# Patient Record
Sex: Male | Born: 1960 | Race: White | Hispanic: No | Marital: Married | Smoking: Never smoker
Health system: Southern US, Community
[De-identification: ages and names within clinical notes are randomized; demographics above are authoritative.]

## PROBLEM LIST (undated history)

## (undated) DIAGNOSIS — J302 Other seasonal allergic rhinitis: Secondary | ICD-10-CM

## (undated) DIAGNOSIS — R413 Other amnesia: Secondary | ICD-10-CM

## (undated) DIAGNOSIS — R519 Headache, unspecified: Secondary | ICD-10-CM

## (undated) DIAGNOSIS — R51 Headache: Secondary | ICD-10-CM

## (undated) DIAGNOSIS — N189 Chronic kidney disease, unspecified: Secondary | ICD-10-CM

## (undated) DIAGNOSIS — G4730 Sleep apnea, unspecified: Secondary | ICD-10-CM

## (undated) DIAGNOSIS — G35 Multiple sclerosis: Secondary | ICD-10-CM

## (undated) DIAGNOSIS — T8859XA Other complications of anesthesia, initial encounter: Secondary | ICD-10-CM

## (undated) DIAGNOSIS — I2510 Atherosclerotic heart disease of native coronary artery without angina pectoris: Secondary | ICD-10-CM

## (undated) DIAGNOSIS — J45909 Unspecified asthma, uncomplicated: Secondary | ICD-10-CM

## (undated) DIAGNOSIS — R569 Unspecified convulsions: Secondary | ICD-10-CM

## (undated) DIAGNOSIS — F419 Anxiety disorder, unspecified: Secondary | ICD-10-CM

## (undated) DIAGNOSIS — C801 Malignant (primary) neoplasm, unspecified: Secondary | ICD-10-CM

## (undated) DIAGNOSIS — S0990XA Unspecified injury of head, initial encounter: Secondary | ICD-10-CM

## (undated) DIAGNOSIS — H539 Unspecified visual disturbance: Secondary | ICD-10-CM

## (undated) DIAGNOSIS — M1990 Unspecified osteoarthritis, unspecified site: Secondary | ICD-10-CM

## (undated) DIAGNOSIS — I639 Cerebral infarction, unspecified: Secondary | ICD-10-CM

## (undated) DIAGNOSIS — F40240 Claustrophobia: Secondary | ICD-10-CM

## (undated) DIAGNOSIS — E162 Hypoglycemia, unspecified: Secondary | ICD-10-CM

## (undated) DIAGNOSIS — R000 Tachycardia, unspecified: Secondary | ICD-10-CM

## (undated) DIAGNOSIS — T4145XA Adverse effect of unspecified anesthetic, initial encounter: Secondary | ICD-10-CM

## (undated) HISTORY — PX: COLONOSCOPY: SHX174

## (undated) HISTORY — DX: Atherosclerotic heart disease of native coronary artery without angina pectoris: I25.10

## (undated) HISTORY — DX: Unspecified convulsions: R56.9

## (undated) HISTORY — DX: Unspecified visual disturbance: H53.9

## (undated) HISTORY — PX: KNEE SURGERY: SHX244

## (undated) HISTORY — PX: NOSE SURGERY: SHX723

## (undated) HISTORY — PX: BACK SURGERY: SHX140

## (undated) HISTORY — PX: WISDOM TOOTH EXTRACTION: SHX21

## (undated) HISTORY — DX: Tachycardia, unspecified: R00.0

## (undated) HISTORY — PX: ESOPHAGOGASTRODUODENOSCOPY: SHX1529

## (undated) HISTORY — DX: Chronic kidney disease, unspecified: N18.9

---

## 2008-07-31 ENCOUNTER — Emergency Department (HOSPITAL_BASED_OUTPATIENT_CLINIC_OR_DEPARTMENT_OTHER): Admission: EM | Admit: 2008-07-31 | Discharge: 2008-07-31 | Payer: Self-pay | Admitting: Emergency Medicine

## 2010-08-29 LAB — DIFFERENTIAL
Basophils Absolute: 0 10*3/uL (ref 0.0–0.1)
Lymphocytes Relative: 22 % (ref 12–46)
Lymphs Abs: 1.3 10*3/uL (ref 0.7–4.0)
Neutro Abs: 4.2 10*3/uL (ref 1.7–7.7)
Neutrophils Relative %: 66 % (ref 43–77)

## 2010-08-29 LAB — GLUCOSE, CAPILLARY: Glucose-Capillary: 101 mg/dL — ABNORMAL HIGH (ref 70–99)

## 2010-08-29 LAB — CBC
HCT: 49.3 % (ref 39.0–52.0)
Platelets: 306 10*3/uL (ref 150–400)
RDW: 12.7 % (ref 11.5–15.5)
WBC: 6.3 10*3/uL (ref 4.0–10.5)

## 2010-08-29 LAB — BASIC METABOLIC PANEL
BUN: 13 mg/dL (ref 6–23)
Calcium: 8.8 mg/dL (ref 8.4–10.5)
Creatinine, Ser: 0.9 mg/dL (ref 0.4–1.5)
GFR calc non Af Amer: >60 mL/min (ref >60)
Potassium: 4.7 mEq/L (ref 3.5–5.1)

## 2010-11-18 ENCOUNTER — Other Ambulatory Visit: Payer: Self-pay | Admitting: Orthopedic Surgery

## 2010-11-18 DIAGNOSIS — G8929 Other chronic pain: Secondary | ICD-10-CM

## 2010-11-18 DIAGNOSIS — M545 Low back pain, unspecified: Secondary | ICD-10-CM

## 2010-11-25 ENCOUNTER — Ambulatory Visit
Admission: RE | Admit: 2010-11-25 | Discharge: 2010-11-25 | Disposition: A | Discharge status: Home / Self Care | Payer: Medicare FFS | Source: Ambulatory Visit | Attending: Orthopedic Surgery | Admitting: Orthopedic Surgery

## 2010-11-25 DIAGNOSIS — M545 Low back pain, unspecified: Secondary | ICD-10-CM

## 2010-11-25 DIAGNOSIS — G8929 Other chronic pain: Secondary | ICD-10-CM

## 2012-10-02 ENCOUNTER — Emergency Department (HOSPITAL_BASED_OUTPATIENT_CLINIC_OR_DEPARTMENT_OTHER): Payer: 59

## 2012-10-02 ENCOUNTER — Encounter (HOSPITAL_BASED_OUTPATIENT_CLINIC_OR_DEPARTMENT_OTHER): Payer: Self-pay | Admitting: *Deleted

## 2012-10-02 ENCOUNTER — Emergency Department (HOSPITAL_BASED_OUTPATIENT_CLINIC_OR_DEPARTMENT_OTHER)
Admission: EM | Admit: 2012-10-02 | Discharge: 2012-10-02 | Disposition: A | Discharge status: Home / Self Care | Payer: 59 | Attending: Emergency Medicine | Admitting: Emergency Medicine

## 2012-10-02 DIAGNOSIS — Y929 Unspecified place or not applicable: Secondary | ICD-10-CM | POA: Insufficient documentation

## 2012-10-02 DIAGNOSIS — S61209A Unspecified open wound of unspecified finger without damage to nail, initial encounter: Secondary | ICD-10-CM | POA: Insufficient documentation

## 2012-10-02 DIAGNOSIS — W260XXA Contact with knife, initial encounter: Secondary | ICD-10-CM | POA: Insufficient documentation

## 2012-10-02 DIAGNOSIS — Y9389 Activity, other specified: Secondary | ICD-10-CM | POA: Insufficient documentation

## 2012-10-02 DIAGNOSIS — J45909 Unspecified asthma, uncomplicated: Secondary | ICD-10-CM | POA: Insufficient documentation

## 2012-10-02 DIAGNOSIS — Z23 Encounter for immunization: Secondary | ICD-10-CM | POA: Insufficient documentation

## 2012-10-02 DIAGNOSIS — G35 Multiple sclerosis: Secondary | ICD-10-CM

## 2012-10-02 DIAGNOSIS — IMO0002 Reserved for concepts with insufficient information to code with codable children: Secondary | ICD-10-CM

## 2012-10-02 DIAGNOSIS — Z8659 Personal history of other mental and behavioral disorders: Secondary | ICD-10-CM | POA: Insufficient documentation

## 2012-10-02 DIAGNOSIS — W261XXA Contact with sword or dagger, initial encounter: Secondary | ICD-10-CM | POA: Insufficient documentation

## 2012-10-02 HISTORY — DX: Unspecified asthma, uncomplicated: J45.909

## 2012-10-02 HISTORY — DX: Multiple sclerosis: G35

## 2012-10-02 HISTORY — DX: Anxiety disorder, unspecified: F41.9

## 2012-10-02 HISTORY — DX: Other seasonal allergic rhinitis: J30.2

## 2012-10-02 MED ORDER — TETANUS-DIPHTH-ACELL PERTUSSIS 5-2.5-18.5 LF-MCG/0.5 IM SUSP
0.5000 mL | Freq: Once | INTRAMUSCULAR | Status: AC
  Administered 2012-10-02: 0.5 mL via INTRAMUSCULAR

## 2012-10-02 MED ORDER — CEPHALEXIN 500 MG PO CAPS: 500.0000 mg | ORAL_CAPSULE | Freq: Four times a day (QID) | ORAL | Status: DC

## 2012-10-02 MED ORDER — PROMETHAZINE HCL 25 MG PO TABS
25.0000 mg | ORAL_TABLET | Freq: Once | ORAL | Status: AC
Start: 2012-10-02 — End: 2012-10-02
  Administered 2012-10-02: 25 mg via ORAL
  Filled 2012-10-02: qty 23

## 2012-10-02 MED ORDER — TETANUS-DIPHTH-ACELL PERTUSSIS 5-2.5-18.5 LF-MCG/0.5 IM SUSP
INTRAMUSCULAR | Status: AC
  Administered 2012-10-02: 0.5 mL via INTRAMUSCULAR
  Filled 2012-10-02: qty 0.5

## 2012-10-02 NOTE — ED Notes (Signed)
Approx 3 cm lac to left thumb. ?ligament involvement. Does not feel touch, but can move thumb. Moist saline dressing applied. Bleeding controlled.

## 2012-10-02 NOTE — ED Notes (Signed)
Pt returns to hall x2 having removed bandage from thumb. Bleeding still controlled, new tape applied, education provided.

## 2012-10-02 NOTE — ED Notes (Signed)
Suture cart is at the bedside set up and ready for the doctor to use. 

## 2012-10-02 NOTE — ED Provider Notes (Signed)
History     CSN: [id number]  Arrival date & time 10/02/12  1505   First MD Initiated Contact with Patient 10/02/12 1552      Chief Complaint  Patient presents with  . Laceration    (Consider location/radiation/quality/duration/timing/severity/associated sxs/prior treatment) HPI Comments: Joel Galloway is a 52 y/o M with PMHx of MS, asthma, and anxiety presenting to the ED after sustaining a laceration to the left thumb. Patient reported that this occurred today, while using his pocket knife - he was trying to open a box and ended up cutting his left thumb. Patient stated that he has a burning sensation to the thumb. Reported mild numbness to the site. Denied radiation of discomfort. Reported mild nausea. Denied headache, dizziness, tingling, chest pain, shortness of breathe, difficulty breathing.    The history is provided by the patient. No language interpreter was used.    Past Medical History  Diagnosis Date  . Multiple sclerosis   . Asthma   . Seasonal allergies   . Anxiety     Past Surgical History  Procedure Laterality Date  . Knee surgery    . Nose surgery    . Wisdom tooth extraction      History reviewed. No pertinent family history.  History  Substance Use Topics  . Smoking status: Never Smoker   . Smokeless tobacco: Not on file  . Alcohol Use: No      Review of Systems  Constitutional: Negative for fever, chills and fatigue.  HENT: Negative for ear pain, congestion, sore throat, trouble swallowing, neck pain and neck stiffness.   Respiratory: Negative for cough, chest tightness, shortness of breath and wheezing.   Cardiovascular: Negative for chest pain.  Gastrointestinal: Negative for nausea, vomiting, abdominal pain, diarrhea and constipation.  Musculoskeletal: Negative for back pain and arthralgias.  Skin: Positive for wound. Negative for rash.  Neurological: Positive for numbness. Negative for dizziness, weakness, light-headedness and headaches.    All other systems reviewed and are negative.    Allergies  Neosporin  Home Medications   Current Outpatient Rx  Name  Route  Sig  Dispense  Refill  . cephALEXin (KEFLEX) 500 MG capsule   Oral   Take 1 capsule (500 mg total) by mouth 4 (four) times daily.   40 capsule   0     BP 132/73  Pulse 94  Temp(Src) 98.2 F (36.8 C) (Oral)  Resp 20  Ht 6\' 1"  (1.854 m)  Wt 170 lb (77.111 kg)  BMI 22.43 kg/m2  SpO2 95%  Physical Exam  Nursing note and vitals reviewed. Constitutional: He is oriented to person, place, and time. He appears well-developed and well-nourished. No distress.  HENT:  Head: Normocephalic and atraumatic.  Mouth/Throat: Oropharynx is clear and moist. No oropharyngeal exudate.  Eyes: Conjunctivae and EOM are normal. Pupils are equal, round, and reactive to light. Right eye exhibits no discharge. Left eye exhibits no discharge.  Neck: Normal range of motion. Neck supple. No tracheal deviation present.  Negative neck stiffness Negative nuchal rigidity Negative lymphadenopathy  Cardiovascular: Normal rate, regular rhythm and normal heart sounds.  Exam reveals no friction rub.   No murmur heard. Radial pulses 2+ bilaterally   Pulmonary/Chest: Effort normal and breath sounds normal. No respiratory distress. He has no wheezes. He has no rales.  Musculoskeletal: Normal range of motion. He exhibits tenderness. He exhibits no edema.       Left hand: He exhibits tenderness and laceration. He exhibits normal range of motion and  no swelling. Normal sensation noted. Normal strength noted.       Hands: Tenderness upon palpation to the left thumb  Lymphadenopathy:    He has no cervical adenopathy.  Neurological: He is alert and oriented to person, place, and time. No cranial nerve deficit. He exhibits normal muscle tone. Coordination normal.  Cranial nerves III-XII grossly intact Sensation present to upper extremities bilaterally with differentiation to sharp and dull  sensation.   Skin: Skin is warm and dry. He is not diaphoretic.  Psychiatric: He has a normal mood and affect. His behavior is normal. Thought content normal.    ED Course  Procedures (including critical care time)  LACERATION REPAIR Performed by: Raymon Mutton Authorized by: Raymon Mutton Consent: Verbal consent obtained. Risks and benefits: risks, benefits and alternatives were discussed Consent given by: patient Patient identity confirmed: provided demographic data Prepped and Draped in normal sterile fashion Wound explored  Laceration Location: left thumb, palmar aspect, tip  Laceration Length: 4cm "V" shaped No Foreign Bodies seen or palpated  Anesthesia: local infiltration  Local anesthetic: lidocaine 2% without epinephrine  Anesthetic total: 5 ml  Irrigation method: syringe Amount of cleaning: standard  Skin closure: approximate  Number of sutures: 5  Technique: single interrupted, 4-0 prolene  Patient tolerance: Patient tolerated the procedure well with no immediate complications.   Labs Reviewed - No data to display Dg Finger Thumb Left  10/02/2012   *RADIOLOGY REPORT*  Clinical Data: Laceration  LEFT THUMB 2+V  Comparison: None.  Findings: Gauze is seen about the thumb.  This slightly decreases imaging detail.  The joints are aligned.  No acute bony fracture is identified.  No joint space abnormality is seen.  There is soft tissue prominence of the thumb.  IMPRESSION: Soft tissue swelling of the thumb with surrounding gauze/bandage. No acute fracture is identified.   Original Report Authenticated By: Britta Mccreedy, M.D.     1. Laceration   2. Multiple sclerosis   3. Asthma       MDM  Patient afebrile, normotensive, non-tachycardic, adequate saturation on room air, alert and oriented.  Left thumb xray - negative fractures, foreign bodies, dislocations noted. No neurovascular damaged noted. Good hemostasis - negative foreign bodies with  exploration. Tendon and deep tendon involvement not noted. Laceration sutured - 5 sutures placed. Tolerated procedure. Tetanus booster administered. Discharged patient. Referred to hand surgeon for Monday. Discussed with patient wound care and return for suture removal. Discharged patient with antibiotics for coverage. Discussed with patient to monitor symptoms and if symptoms are to worsen or change to report back to the ED. Patient agreed to plan of care, understood, all questions answered.           [employer], PA-C 10/03/12 0130

## 2012-10-03 NOTE — ED Provider Notes (Signed)
Medical screening examination/treatment/procedure(s) were performed by non-physician practitioner and as supervising physician I was immediately available for consultation/collaboration.   Charles B. Sheldon, MD 10/03/12 1305 

## 2012-12-27 ENCOUNTER — Encounter (HOSPITAL_COMMUNITY): Payer: Self-pay | Admitting: Pharmacy Technician

## 2012-12-29 ENCOUNTER — Other Ambulatory Visit: Payer: Self-pay | Admitting: Specialist

## 2012-12-31 NOTE — H&P (Signed)
Joel Galloway is an 52 y.o. male.   Chief Complaint: low back pain with radiation into the right leg greater than the left. HPI:  He relates that he has had a recent fall and has been having flare-ups in regards to his back and radiation of pain into both of his lower extremities, right side greater than left.  He has weakness in his left leg that has been present now for years since he underwent previous left knee surgery.  He relates that his back pain dates back years ago to 1987 when he reports that he was building a deck on the back of his house.  When his wife stepped out onto this he caught the deck to prevent her from falling and had discomfort and pain.  He has had problems with his back ever since.  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  He relates that he has had several injuries with broken bones in his arms and legs relative to his activities.  Most recently his back pain has reached the point where he is having difficulty sleeping at night.  He sleeps for only a couple of hours before he has to move around and get up.  He is finding it increasingly more difficult to walk any distance, even to the car now.  He has difficulty walking in the stores.  He has to use a cart to assist in being upright.  He relates that he remains quite flexible, though, although he does have trouble with prolonged standing and ambulation.  He cannot walk a mile.  He cannot walk a quarter of a mile.  He feels as though his quality of life is dropping off tremendously.   MRI study was done at [facility] on 12/13/2012.  The study  indicates a far lateral left sided L3-4 protrusion that extends into the neural foramen without significant stenosis.  Some joint hypertrophy at L2-3 with no nerve compression.  At 4-5 a bulging disk with joint hypertrophy but no stenosis.  At L5-S1 a large central, right paracentral disk protrusion that is significant,  now with severe right lateral recess narrowing and mild to moderate left lateral recess narrowing.    Past Medical History  Diagnosis Date  . Multiple sclerosis   . Asthma   . Seasonal allergies   . Anxiety     Past Surgical History  Procedure Laterality Date  . Knee surgery    . Nose surgery    . Wisdom tooth extraction      No family history on file. Social History:  reports that he has never smoked. He does not have any smokeless tobacco history on file. He reports that he does not drink alcohol or use illicit drugs.  Allergies:  Allergies  Allergen Reactions  . Lyrica [Pregabalin] Other (See Comments)  . Gabapentin Other (See Comments)  . Neosporin [Neomycin-Bacitracin Zn-Polymyx] Swelling    No prescriptions prior to admission    No results found for this or any previous visit (from the past 48 hour(s)). No results found.  Review of Systems  Constitutional: Negative.   HENT: Negative.   Eyes: Negative.   Respiratory: Negative.   Cardiovascular: Negative.   Gastrointestinal: Positive for constipation.       Chronic constipation requiring regular laxative.  Genitourinary:       Uses Flomax for "difficulty urinating".  Occasional incontinence.  Musculoskeletal: Positive for back  pain and joint pain.       Previous surgery for both knees.  Severe back pain limiting activity.  Skin: Negative.   Neurological: Positive for focal weakness.       Diagnosis of Multiple Sclerosis.    Psychiatric/Behavioral: The patient is nervous/anxious.        Chronic pain syndrome requiring large quantities of narcotic analgesics.    There were no vitals taken for this visit. Physical Exam  Constitutional: He is oriented to person, place, and time. He appears well-developed and well-nourished.  HENT:  Head: Normocephalic and atraumatic.  Eyes: EOM are normal. Pupils are equal, round, and reactive to light.  Neck: Normal range of motion. Neck supple.  Cardiovascular: Normal  rate, regular rhythm, normal heart sounds and intact distal pulses.   Respiratory: Effort normal and breath sounds normal.  GI: Soft.  Musculoskeletal:  weak in left foot dorsiflexion.  This is 2/4.  Plantar flexion strength is also decreased on the left at 2/4.  Left knee extension and flexion is normal.  Hip abduction and adduction testing is normal.  The right lower extremity plantar flexion is normal.  Dorsiflexion of the right foot is normal.  He has pain with attempts at leg raise on the right with the hip brought across the midline.  Reflexes at the knee are 2+ and symmetric.  At the ankle are 0 and symmetric.  Popliteal compression sign is negative on both sides.  He has a tendency to sit leaning to the left side, complaining of pain into his right buttock.  EHL is weak on the right at 5-/5  Neurological: He is alert and oriented to person, place, and time.  Skin: Skin is warm and dry.  Psychiatric: He has a normal mood and affect.  Pt very anxious.     Assessment/Plan Degenerative disc disease and HNP L5-S1  PLAN:  Decompression L5-S1,bilateral microdiscectomy.  Right L5-S1 TLIF with cage and local bone graft.  Pedicle screw and rod instrumentation. Patient was seen and examined in the preop holding area. There has been no interval  Change in this patient's exam preop  history and physical exam  Lab tests and images have been examined and reviewed.  The Risks benefits and alternative treatments have been discussed  extensively,questions answered.  The patient has elected to undergo the discussed surgical treatment. VERNON,SHEILA M 12/31/2012, 3:05 PM

## 2013-01-01 NOTE — Pre-Procedure Instructions (Addendum)
IRINEO GAULIN  01/01/2013   Your procedure is scheduled on:  August 25  Report to [facility] at 05:30 AM.  Call this number if you have problems the morning of surgery: [phone]   Remember:   Do not eat food or drink liquids after midnight.   Take these medicines the morning of surgery with A SIP OF WATER: Valium (if needed), Hydrocodone (if needed), Morphine (if needed), Omeprazole , Opana (if needed), Flomax,   STOP Ibuprofen today  Do not take Aspirin, Aleve, Naproxen, Advil, Ibuprofen, Vitamin, Herbs, or Supplements starting today  Do not wear jewelry, make-up or nail polish.  Do not wear lotions, powders, or perfumes. You may wear deodorant.  Do not shave 48 hours prior to surgery. Men may shave face and neck.  Do not bring valuables to the hospital.  [facility] is not responsible                   for any belongings or valuables.  Contacts, dentures or bridgework may not be worn into surgery.  Leave suitcase in the car. After surgery it may be brought to your room.  For patients admitted to the hospital, checkout time is 11:00 AM the day of  discharge.   Special Instructions: Shower using CHG 2 nights before surgery and the night before surgery.  If you shower the day of surgery use CHG.  Use special wash - you have one bottle of CHG for all showers.  You should use approximately 1/3 of the bottle for each shower.   Please read over the following fact sheets that you were given: Pain Booklet, Coughing and Deep Breathing, Blood Transfusion Information, MRSA Information and Surgical Site Infection Prevention

## 2013-01-03 ENCOUNTER — Encounter (HOSPITAL_COMMUNITY)
Admission: RE | Admit: 2013-01-03 | Discharge: 2013-01-03 | Disposition: A | Discharge status: Home / Self Care | Payer: 59 | Source: Ambulatory Visit | Attending: Anesthesiology | Admitting: Anesthesiology

## 2013-01-03 ENCOUNTER — Encounter (HOSPITAL_COMMUNITY)
Admission: RE | Admit: 2013-01-03 | Discharge: 2013-01-03 | Disposition: A | Discharge status: Home / Self Care | Payer: 59 | Source: Ambulatory Visit | Attending: Specialist | Admitting: Specialist

## 2013-01-03 ENCOUNTER — Encounter (HOSPITAL_COMMUNITY): Payer: Self-pay

## 2013-01-03 DIAGNOSIS — R9431 Abnormal electrocardiogram [ECG] [EKG]: Secondary | ICD-10-CM | POA: Insufficient documentation

## 2013-01-03 DIAGNOSIS — Z01818 Encounter for other preprocedural examination: Secondary | ICD-10-CM | POA: Insufficient documentation

## 2013-01-03 DIAGNOSIS — Z01812 Encounter for preprocedural laboratory examination: Secondary | ICD-10-CM | POA: Insufficient documentation

## 2013-01-03 DIAGNOSIS — Z01810 Encounter for preprocedural cardiovascular examination: Secondary | ICD-10-CM | POA: Insufficient documentation

## 2013-01-03 HISTORY — DX: Hypoglycemia, unspecified: E16.2

## 2013-01-03 HISTORY — DX: Malignant (primary) neoplasm, unspecified: C80.1

## 2013-01-03 HISTORY — DX: Unspecified osteoarthritis, unspecified site: M19.90

## 2013-01-03 LAB — TYPE AND SCREEN
ABO/RH(D): A NEG
Antibody Screen: NEGATIVE

## 2013-01-03 LAB — COMPREHENSIVE METABOLIC PANEL
Alkaline Phosphatase: 30 U/L — ABNORMAL LOW (ref 39–117)
BUN: 11 mg/dL (ref 6–23)
CO2: 26 mEq/L (ref 19–32)
Chloride: 99 mEq/L (ref 96–112)
Creatinine, Ser: 0.57 mg/dL (ref 0.50–1.35)
GFR calc Af Amer: >90 mL/min (ref >90)
GFR calc non Af Amer: >90 mL/min (ref >90)
Glucose, Bld: 120 mg/dL — ABNORMAL HIGH (ref 70–99)
Potassium: 4.7 mEq/L (ref 3.5–5.1)
Total Bilirubin: 0.2 mg/dL — ABNORMAL LOW (ref 0.3–1.2)

## 2013-01-03 LAB — URINALYSIS, ROUTINE W REFLEX MICROSCOPIC
Bilirubin Urine: NEGATIVE
Glucose, UA: NEGATIVE mg/dL
Hgb urine dipstick: NEGATIVE
Protein, ur: NEGATIVE mg/dL
Urobilinogen, UA: 0.2 mg/dL (ref 0.0–1.0)

## 2013-01-03 LAB — CBC
HCT: 42.9 % (ref 39.0–52.0)
Hemoglobin: 15.8 g/dL (ref 13.0–17.0)
MCV: 87.6 fL (ref 78.0–100.0)
RDW: 12.5 % (ref 11.5–15.5)
WBC: 5.8 10*3/uL (ref 4.0–10.5)

## 2013-01-03 LAB — SURGICAL PCR SCREEN: MRSA, PCR: NEGATIVE

## 2013-01-04 MED ORDER — BUPIVACAINE HCL (PF) 0.5 % IJ SOLN
INTRAMUSCULAR | Status: AC
  Filled 2013-01-04: qty 30

## 2013-01-09 MED ORDER — CHLORHEXIDINE GLUCONATE 4 % EX LIQD: 60.0000 mL | Freq: Once | CUTANEOUS | Status: DC

## 2013-01-09 MED ORDER — CEFAZOLIN SODIUM-DEXTROSE 2-3 GM-% IV SOLR
2.0000 g | INTRAVENOUS | Status: AC
  Administered 2013-01-10: 2 g via INTRAVENOUS
  Filled 2013-01-09: qty 50

## 2013-01-10 ENCOUNTER — Encounter (HOSPITAL_COMMUNITY): Payer: Self-pay | Admitting: *Deleted

## 2013-01-10 ENCOUNTER — Inpatient Hospital Stay (HOSPITAL_COMMUNITY)
Admission: RE | Admit: 2013-01-10 | Discharge: 2013-01-13 | DRG: 460 | Disposition: A | Discharge status: Home Health | Payer: 59 | Source: Ambulatory Visit | Attending: Specialist | Admitting: Specialist

## 2013-01-10 ENCOUNTER — Encounter (HOSPITAL_COMMUNITY): Payer: Self-pay | Admitting: Certified Registered"

## 2013-01-10 ENCOUNTER — Inpatient Hospital Stay (HOSPITAL_COMMUNITY): Payer: 59 | Admitting: Certified Registered"

## 2013-01-10 ENCOUNTER — Encounter (HOSPITAL_COMMUNITY)
Admission: RE | Disposition: A | Discharge status: Home Health | Payer: Self-pay | Source: Ambulatory Visit | Attending: Specialist

## 2013-01-10 ENCOUNTER — Inpatient Hospital Stay (HOSPITAL_COMMUNITY): Payer: 59

## 2013-01-10 DIAGNOSIS — M5126 Other intervertebral disc displacement, lumbar region: Secondary | ICD-10-CM | POA: Diagnosis present

## 2013-01-10 DIAGNOSIS — J45909 Unspecified asthma, uncomplicated: Secondary | ICD-10-CM | POA: Diagnosis present

## 2013-01-10 DIAGNOSIS — IMO0002 Reserved for concepts with insufficient information to code with codable children: Secondary | ICD-10-CM | POA: Diagnosis not present

## 2013-01-10 DIAGNOSIS — M51379 Other intervertebral disc degeneration, lumbosacral region without mention of lumbar back pain or lower extremity pain: Secondary | ICD-10-CM | POA: Diagnosis present

## 2013-01-10 DIAGNOSIS — F411 Generalized anxiety disorder: Secondary | ICD-10-CM | POA: Diagnosis present

## 2013-01-10 DIAGNOSIS — Y9210 Unspecified residential institution as the place of occurrence of the external cause: Secondary | ICD-10-CM | POA: Diagnosis not present

## 2013-01-10 DIAGNOSIS — G894 Chronic pain syndrome: Secondary | ICD-10-CM | POA: Diagnosis present

## 2013-01-10 DIAGNOSIS — M5137 Other intervertebral disc degeneration, lumbosacral region: Secondary | ICD-10-CM

## 2013-01-10 DIAGNOSIS — Z888 Allergy status to other drugs, medicaments and biological substances status: Secondary | ICD-10-CM

## 2013-01-10 DIAGNOSIS — G35 Multiple sclerosis: Secondary | ICD-10-CM | POA: Diagnosis present

## 2013-01-10 DIAGNOSIS — M5136 Other intervertebral disc degeneration, lumbar region: Secondary | ICD-10-CM | POA: Diagnosis present

## 2013-01-10 DIAGNOSIS — G9741 Accidental puncture or laceration of dura during a procedure: Secondary | ICD-10-CM | POA: Diagnosis not present

## 2013-01-10 DIAGNOSIS — M48062 Spinal stenosis, lumbar region with neurogenic claudication: Secondary | ICD-10-CM | POA: Diagnosis present

## 2013-01-10 DIAGNOSIS — K5900 Constipation, unspecified: Secondary | ICD-10-CM | POA: Diagnosis present

## 2013-01-10 HISTORY — PX: POSTERIOR FUSION LUMBAR SPINE: SUR632

## 2013-01-10 LAB — GLUCOSE, CAPILLARY: Glucose-Capillary: 94 mg/dL (ref 70–99)

## 2013-01-10 SURGERY — POSTERIOR LUMBAR FUSION 1 LEVEL
Anesthesia: General | Site: Back | Wound class: Clean

## 2013-01-10 MED ORDER — ONDANSETRON HCL 4 MG/2ML IJ SOLN
4.0000 mg | INTRAMUSCULAR | Status: DC | PRN
  Administered 2013-01-11 – 2013-01-12 (×4): 4 mg via INTRAVENOUS
  Filled 2013-01-10 (×2): qty 2

## 2013-01-10 MED ORDER — NEOSTIGMINE METHYLSULFATE 1 MG/ML IJ SOLN
INTRAMUSCULAR | Status: DC | PRN
  Administered 2013-01-10: 3 mg via INTRAVENOUS

## 2013-01-10 MED ORDER — KETOROLAC TROMETHAMINE 30 MG/ML IJ SOLN
30.0000 mg | Freq: Four times a day (QID) | INTRAMUSCULAR | Status: AC
  Administered 2013-01-11 – 2013-01-12 (×5): 30 mg via INTRAVENOUS
  Filled 2013-01-10 (×5): qty 1

## 2013-01-10 MED ORDER — PHENOL 1.4 % MT LIQD: 1.0000 | OROMUCOSAL | Status: DC | PRN

## 2013-01-10 MED ORDER — MORPHINE SULFATE 2 MG/ML IJ SOLN
INTRAMUSCULAR | Status: AC
  Administered 2013-01-10: 2 mg via INTRAVENOUS
  Filled 2013-01-10: qty 1

## 2013-01-10 MED ORDER — DOCUSATE SODIUM 100 MG PO CAPS
100.0000 mg | ORAL_CAPSULE | Freq: Two times a day (BID) | ORAL | Status: DC
  Administered 2013-01-10 – 2013-01-12 (×4): 100 mg via ORAL
  Filled 2013-01-10 (×7): qty 1

## 2013-01-10 MED ORDER — MIDAZOLAM HCL 5 MG/5ML IJ SOLN
INTRAMUSCULAR | Status: DC | PRN
  Administered 2013-01-10: 2 mg via INTRAVENOUS

## 2013-01-10 MED ORDER — SODIUM CHLORIDE 0.9 % IJ SOLN: 3.0000 mL | INTRAMUSCULAR | Status: DC | PRN

## 2013-01-10 MED ORDER — LINACLOTIDE 290 MCG PO CAPS: 290.0000 ug | ORAL_CAPSULE | Freq: Every day | ORAL | Status: DC

## 2013-01-10 MED ORDER — HYDROMORPHONE HCL PF 1 MG/ML IJ SOLN
0.2500 mg | INTRAMUSCULAR | Status: DC | PRN
  Administered 2013-01-10 (×3): 0.5 mg via INTRAVENOUS

## 2013-01-10 MED ORDER — BUPIVACAINE-EPINEPHRINE 0.5% -1:200000 IJ SOLN
INTRAMUSCULAR | Status: DC | PRN
  Administered 2013-01-10: 9 mL

## 2013-01-10 MED ORDER — ALUM & MAG HYDROXIDE-SIMETH 200-200-20 MG/5ML PO SUSP: 30.0000 mL | Freq: Four times a day (QID) | ORAL | Status: DC | PRN

## 2013-01-10 MED ORDER — CEFAZOLIN SODIUM 1-5 GM-% IV SOLN
1.0000 g | Freq: Three times a day (TID) | INTRAVENOUS | Status: AC
  Administered 2013-01-10 (×2): 1 g via INTRAVENOUS
  Filled 2013-01-10 (×2): qty 50

## 2013-01-10 MED ORDER — LISINOPRIL 20 MG PO TABS
20.0000 mg | ORAL_TABLET | Freq: Every day | ORAL | Status: DC
  Filled 2013-01-10: qty 1

## 2013-01-10 MED ORDER — DIPHENHYDRAMINE HCL 50 MG/ML IJ SOLN
12.5000 mg | Freq: Four times a day (QID) | INTRAMUSCULAR | Status: DC | PRN
  Administered 2013-01-12: 12.5 mg via INTRAVENOUS
  Filled 2013-01-10: qty 1

## 2013-01-10 MED ORDER — ACETAMINOPHEN 325 MG PO TABS
650.0000 mg | ORAL_TABLET | ORAL | Status: DC | PRN
Start: 2013-01-10 — End: 2013-01-13
  Administered 2013-01-10 – 2013-01-12 (×5): 650 mg via ORAL
  Filled 2013-01-10 (×2): qty 2
  Filled 2013-01-10: qty 1
  Filled 2013-01-10 (×2): qty 2

## 2013-01-10 MED ORDER — FLEET ENEMA 7-19 GM/118ML RE ENEM: 1.0000 | ENEMA | Freq: Once | RECTAL | Status: AC | PRN

## 2013-01-10 MED ORDER — DIPHENHYDRAMINE HCL 12.5 MG/5ML PO ELIX: 12.5000 mg | ORAL_SOLUTION | Freq: Four times a day (QID) | ORAL | Status: DC | PRN

## 2013-01-10 MED ORDER — BUPIVACAINE-EPINEPHRINE (PF) 0.5% -1:200000 IJ SOLN
INTRAMUSCULAR | Status: AC
  Filled 2013-01-10: qty 10

## 2013-01-10 MED ORDER — ARTIFICIAL TEARS OP OINT
TOPICAL_OINTMENT | OPHTHALMIC | Status: DC | PRN
  Administered 2013-01-10: 1 via OPHTHALMIC

## 2013-01-10 MED ORDER — THROMBIN 20000 UNITS EX SOLR
CUTANEOUS | Status: AC
  Filled 2013-01-10: qty 20000

## 2013-01-10 MED ORDER — NALOXONE HCL 0.4 MG/ML IJ SOLN: 0.4000 mg | INTRAMUSCULAR | Status: DC | PRN

## 2013-01-10 MED ORDER — THROMBIN 20000 UNITS EX SOLR
CUTANEOUS | Status: DC | PRN
  Administered 2013-01-10: 08:00:00 via TOPICAL

## 2013-01-10 MED ORDER — FENTANYL CITRATE 0.05 MG/ML IJ SOLN
INTRAMUSCULAR | Status: DC | PRN
  Administered 2013-01-10: 50 ug via INTRAVENOUS
  Administered 2013-01-10: 100 ug via INTRAVENOUS
  Administered 2013-01-10: 200 ug via INTRAVENOUS

## 2013-01-10 MED ORDER — SODIUM CHLORIDE 0.9 % IJ SOLN: 9.0000 mL | INTRAMUSCULAR | Status: DC | PRN

## 2013-01-10 MED ORDER — KCL IN DEXTROSE-NACL 20-5-0.9 MEQ/L-%-% IV SOLN
INTRAVENOUS | Status: DC
  Administered 2013-01-11 – 2013-01-12 (×3): via INTRAVENOUS
  Filled 2013-01-10 (×8): qty 1000

## 2013-01-10 MED ORDER — POLYETHYLENE GLYCOL 3350 17 G PO PACK
17.0000 g | PACK | Freq: Every day | ORAL | Status: DC | PRN
  Administered 2013-01-12: 17 g via ORAL
  Filled 2013-01-10: qty 1

## 2013-01-10 MED ORDER — MORPHINE SULFATE (PF) 1 MG/ML IV SOLN
INTRAVENOUS | Status: DC
  Administered 2013-01-10: 25 mL via INTRAVENOUS
  Administered 2013-01-10: 23:00:00 via INTRAVENOUS
  Administered 2013-01-11: 8.46 mg via INTRAVENOUS
  Administered 2013-01-11 (×3): via INTRAVENOUS
  Administered 2013-01-11: 23.6 mg via INTRAVENOUS
  Administered 2013-01-11: 16.9 mg via INTRAVENOUS
  Administered 2013-01-11 (×2): via INTRAVENOUS
  Administered 2013-01-12: 42.72 mg via INTRAVENOUS
  Administered 2013-01-12: 02:00:00 via INTRAVENOUS
  Administered 2013-01-12: 18 mg via INTRAVENOUS
  Administered 2013-01-12 (×4): via INTRAVENOUS
  Administered 2013-01-12: 10.5 mg via INTRAVENOUS
  Administered 2013-01-12: 23.81 mg via INTRAVENOUS
  Administered 2013-01-12: 18:00:00 via INTRAVENOUS
  Administered 2013-01-12: 27.94 mg via INTRAVENOUS
  Filled 2013-01-10 (×13): qty 25

## 2013-01-10 MED ORDER — ROCURONIUM BROMIDE 100 MG/10ML IV SOLN
INTRAVENOUS | Status: DC | PRN
  Administered 2013-01-10: 50 mg via INTRAVENOUS
  Administered 2013-01-10 (×4): 10 mg via INTRAVENOUS
  Administered 2013-01-10: 20 mg via INTRAVENOUS

## 2013-01-10 MED ORDER — LACTATED RINGERS IV SOLN
INTRAVENOUS | Status: DC | PRN
  Administered 2013-01-10 (×3): via INTRAVENOUS

## 2013-01-10 MED ORDER — ACETAMINOPHEN 650 MG RE SUPP
650.0000 mg | RECTAL | Status: DC | PRN
  Administered 2013-01-12: 650 mg via RECTAL
  Filled 2013-01-10: qty 1

## 2013-01-10 MED ORDER — PHENYLEPHRINE HCL 10 MG/ML IJ SOLN
INTRAMUSCULAR | Status: DC | PRN
  Administered 2013-01-10 (×2): 80 ug via INTRAVENOUS
  Administered 2013-01-10: 160 ug via INTRAVENOUS
  Administered 2013-01-10 (×5): 80 ug via INTRAVENOUS

## 2013-01-10 MED ORDER — ONDANSETRON HCL 4 MG/2ML IJ SOLN
INTRAMUSCULAR | Status: DC | PRN
  Administered 2013-01-10: 4 mg via INTRAVENOUS

## 2013-01-10 MED ORDER — OXYCODONE HCL 5 MG PO TABS
ORAL_TABLET | ORAL | Status: AC
  Administered 2013-01-10: 14:00:00
  Filled 2013-01-10: qty 1

## 2013-01-10 MED ORDER — OXYCODONE HCL 5 MG PO TABS
5.0000 mg | ORAL_TABLET | Freq: Once | ORAL | Status: AC | PRN
  Administered 2013-01-10: 5 mg via ORAL

## 2013-01-10 MED ORDER — HYDROCODONE-ACETAMINOPHEN 10-325 MG PO TABS
1.0000 | ORAL_TABLET | ORAL | Status: DC | PRN
  Administered 2013-01-10 – 2013-01-13 (×10): 1 via ORAL
  Filled 2013-01-10 (×11): qty 1

## 2013-01-10 MED ORDER — ONDANSETRON HCL 4 MG/2ML IJ SOLN: 4.0000 mg | Freq: Once | INTRAMUSCULAR | Status: DC | PRN

## 2013-01-10 MED ORDER — OXYCODONE HCL 5 MG/5ML PO SOLN: 5.0000 mg | Freq: Once | ORAL | Status: AC | PRN

## 2013-01-10 MED ORDER — ONDANSETRON HCL 4 MG/2ML IJ SOLN
4.0000 mg | Freq: Four times a day (QID) | INTRAMUSCULAR | Status: DC | PRN
  Filled 2013-01-10 (×2): qty 2

## 2013-01-10 MED ORDER — PANTOPRAZOLE SODIUM 40 MG PO TBEC
40.0000 mg | DELAYED_RELEASE_TABLET | Freq: Every day | ORAL | Status: DC
  Administered 2013-01-10 – 2013-01-13 (×4): 40 mg via ORAL
  Filled 2013-01-10 (×3): qty 1

## 2013-01-10 MED ORDER — MORPHINE SULFATE ER 15 MG PO TBCR
60.0000 mg | EXTENDED_RELEASE_TABLET | Freq: Three times a day (TID) | ORAL | Status: DC | PRN
  Administered 2013-01-10: 30 mg via ORAL
  Administered 2013-01-11 – 2013-01-13 (×6): 60 mg via ORAL
  Filled 2013-01-10 (×4): qty 4
  Filled 2013-01-10: qty 1
  Filled 2013-01-10 (×3): qty 4

## 2013-01-10 MED ORDER — LINACLOTIDE 290 MCG PO CAPS
290.0000 mg | ORAL_CAPSULE | Freq: Every morning | ORAL | Status: DC
  Administered 2013-01-11 – 2013-01-12 (×2): 290 mg via ORAL
  Filled 2013-01-10 (×3): qty 1

## 2013-01-10 MED ORDER — EVICEL 2 ML EX KIT
PACK | CUTANEOUS | Status: DC | PRN
  Administered 2013-01-10: 2 mL

## 2013-01-10 MED ORDER — BISACODYL 10 MG RE SUPP: 10.0000 mg | Freq: Every day | RECTAL | Status: DC | PRN

## 2013-01-10 MED ORDER — DIPHENHYDRAMINE HCL 25 MG PO CAPS: 25.0000 mg | ORAL_CAPSULE | Freq: Four times a day (QID) | ORAL | Status: DC | PRN

## 2013-01-10 MED ORDER — SODIUM CHLORIDE 0.9 % IV SOLN
INTRAVENOUS | Status: DC | PRN
  Administered 2013-01-10: 11:00:00 via INTRAVENOUS

## 2013-01-10 MED ORDER — MEPERIDINE HCL 25 MG/ML IJ SOLN: 6.2500 mg | INTRAMUSCULAR | Status: DC | PRN

## 2013-01-10 MED ORDER — HYDROMORPHONE HCL PF 1 MG/ML IJ SOLN
INTRAMUSCULAR | Status: AC
  Administered 2013-01-10: 13:00:00
  Filled 2013-01-10: qty 1

## 2013-01-10 MED ORDER — ALBUTEROL SULFATE HFA 108 (90 BASE) MCG/ACT IN AERS
2.0000 | INHALATION_SPRAY | Freq: Four times a day (QID) | RESPIRATORY_TRACT | Status: DC | PRN
  Filled 2013-01-10: qty 6.7

## 2013-01-10 MED ORDER — HYDROMORPHONE HCL PF 1 MG/ML IJ SOLN
INTRAMUSCULAR | Status: AC
  Administered 2013-01-10: 14:00:00
  Filled 2013-01-10: qty 1

## 2013-01-10 MED ORDER — 0.9 % SODIUM CHLORIDE (POUR BTL) OPTIME
TOPICAL | Status: DC | PRN
  Administered 2013-01-10: 1000 mL

## 2013-01-10 MED ORDER — MORPHINE SULFATE 15 MG PO TABS
30.0000 mg | ORAL_TABLET | ORAL | Status: DC | PRN
  Administered 2013-01-10: 30 mg via ORAL
  Filled 2013-01-10: qty 2

## 2013-01-10 MED ORDER — TAMSULOSIN HCL 0.4 MG PO CAPS
0.4000 mg | ORAL_CAPSULE | Freq: Every day | ORAL | Status: DC
  Administered 2013-01-11 – 2013-01-13 (×3): 0.4 mg via ORAL
  Filled 2013-01-10 (×4): qty 1

## 2013-01-10 MED ORDER — MORPHINE SULFATE 2 MG/ML IJ SOLN
1.0000 mg | INTRAMUSCULAR | Status: DC | PRN
  Administered 2013-01-10: 2 mg via INTRAVENOUS
  Filled 2013-01-10: qty 1
  Filled 2013-01-10: qty 2

## 2013-01-10 MED ORDER — MENTHOL 3 MG MT LOZG: 1.0000 | LOZENGE | OROMUCOSAL | Status: DC | PRN

## 2013-01-10 MED ORDER — SODIUM CHLORIDE 0.9 % IJ SOLN
3.0000 mL | Freq: Two times a day (BID) | INTRAMUSCULAR | Status: DC
  Administered 2013-01-10: 3 mL via INTRAVENOUS

## 2013-01-10 MED ORDER — KETOROLAC TROMETHAMINE 30 MG/ML IJ SOLN
30.0000 mg | Freq: Four times a day (QID) | INTRAMUSCULAR | Status: DC
  Administered 2013-01-10: 30 mg via INTRAVENOUS
  Filled 2013-01-10: qty 1

## 2013-01-10 MED ORDER — GLYCOPYRROLATE 0.2 MG/ML IJ SOLN
INTRAMUSCULAR | Status: DC | PRN
  Administered 2013-01-10: 0.4 mg via INTRAVENOUS

## 2013-01-10 MED ORDER — EVICEL 2 ML EX KIT
PACK | CUTANEOUS | Status: AC
  Filled 2013-01-10: qty 1

## 2013-01-10 MED ORDER — INTERFERON BETA-1A 30 MCG/0.5ML IM KIT: 30.0000 ug | PACK | INTRAMUSCULAR | Status: DC

## 2013-01-10 MED ORDER — PROPOFOL 10 MG/ML IV BOLUS
INTRAVENOUS | Status: DC | PRN
  Administered 2013-01-10: 200 mg via INTRAVENOUS

## 2013-01-10 MED ORDER — DIAZEPAM 5 MG PO TABS
10.0000 mg | ORAL_TABLET | Freq: Four times a day (QID) | ORAL | Status: DC | PRN
Start: 2013-01-10 — End: 2013-01-13
  Administered 2013-01-10: 10 mg via ORAL
  Administered 2013-01-10: 5 mg via ORAL
  Administered 2013-01-11 – 2013-01-12 (×5): 10 mg via ORAL
  Filled 2013-01-10: qty 2
  Filled 2013-01-10: qty 1
  Filled 2013-01-10 (×2): qty 2
  Filled 2013-01-10: qty 1
  Filled 2013-01-10: qty 2
  Filled 2013-01-10: qty 1
  Filled 2013-01-10: qty 2

## 2013-01-10 MED ORDER — DIAZEPAM 5 MG PO TABS
ORAL_TABLET | ORAL | Status: AC
  Administered 2013-01-10: 5 mg
  Filled 2013-01-10: qty 1

## 2013-01-10 MED ORDER — SODIUM CHLORIDE 0.9 % IV SOLN: 250.0000 mL | INTRAVENOUS | Status: DC

## 2013-01-10 SURGICAL SUPPLY — 72 items
BLADE SURG ROTATE 9660 (MISCELLANEOUS) IMPLANT
BONE FILLER CHRONOS 100X25X6 (Bone Implant) ×2 IMPLANT
BUR ROUND FLUTED 4 SOFT TCH (BURR) ×2 IMPLANT
CAGE CONCORDE BULLET 9X8X27 (Cage) ×1 IMPLANT
CAGE SPNL 5D BLT NOSE 27X9X8X (Cage) ×1 IMPLANT
CLOTH BEACON ORANGE TIMEOUT ST (SAFETY) ×2 IMPLANT
CORDS BIPOLAR (ELECTRODE) ×2 IMPLANT
COVER MAYO STAND STRL (DRAPES) ×4 IMPLANT
COVER SURGICAL LIGHT HANDLE (MISCELLANEOUS) ×2 IMPLANT
DERMABOND ADHESIVE PROPEN (GAUZE/BANDAGES/DRESSINGS) ×1
DERMABOND ADVANCED (GAUZE/BANDAGES/DRESSINGS) ×1
DERMABOND ADVANCED .7 DNX12 (GAUZE/BANDAGES/DRESSINGS) ×1 IMPLANT
DERMABOND ADVANCED .7 DNX6 (GAUZE/BANDAGES/DRESSINGS) ×1 IMPLANT
DRAPE C-ARM 42X72 X-RAY (DRAPES) ×2 IMPLANT
DRAPE SURG 17X23 STRL (DRAPES) ×6 IMPLANT
DRAPE TABLE COVER HEAVY DUTY (DRAPES) ×2 IMPLANT
DRSG MEPILEX BORDER 4X4 (GAUZE/BANDAGES/DRESSINGS) IMPLANT
DRSG MEPILEX BORDER 4X8 (GAUZE/BANDAGES/DRESSINGS) ×2 IMPLANT
DURAPREP 26ML APPLICATOR (WOUND CARE) ×2 IMPLANT
ELECT BLADE 6.5 EXT (BLADE) ×2 IMPLANT
ELECT CAUTERY BLADE 6.4 (BLADE) ×2 IMPLANT
ELECT REM PT RETURN 9FT ADLT (ELECTROSURGICAL) ×2
ELECTRODE REM PT RTRN 9FT ADLT (ELECTROSURGICAL) ×1 IMPLANT
EVACUATOR 1/8 PVC DRAIN (DRAIN) IMPLANT
GLOVE BIOGEL PI IND STRL 7.5 (GLOVE) ×1 IMPLANT
GLOVE BIOGEL PI IND STRL 8 (GLOVE) ×1 IMPLANT
GLOVE BIOGEL PI INDICATOR 7.5 (GLOVE) ×1
GLOVE BIOGEL PI INDICATOR 8 (GLOVE) ×1
GLOVE BIOGEL PI ORTHO PRO 7.5 (GLOVE) ×1
GLOVE ECLIPSE 7.0 STRL STRAW (GLOVE) ×2 IMPLANT
GLOVE ECLIPSE 8.5 STRL (GLOVE) ×4 IMPLANT
GLOVE PI ORTHO PRO STRL 7.5 (GLOVE) ×1 IMPLANT
GLOVE SURG 8.5 LATEX PF (GLOVE) ×2 IMPLANT
GLOVE SURG SS PI 7.5 STRL IVOR (GLOVE) ×4 IMPLANT
GOWN PREVENTION PLUS LG XLONG (DISPOSABLE) IMPLANT
GOWN PREVENTION PLUS XXLARGE (GOWN DISPOSABLE) ×4 IMPLANT
GOWN STRL NON-REIN LRG LVL3 (GOWN DISPOSABLE) ×4 IMPLANT
GOWN STRL REIN XL XLG (GOWN DISPOSABLE) ×2 IMPLANT
HEMOSTAT SURGICEL 2X14 (HEMOSTASIS) IMPLANT
KIT BASIN OR (CUSTOM PROCEDURE TRAY) ×2 IMPLANT
KIT POSITION SURG JACKSON T1 (MISCELLANEOUS) ×2 IMPLANT
KIT ROOM TURNOVER OR (KITS) ×2 IMPLANT
NEEDLE 22X1 1/2 (OR ONLY) (NEEDLE) ×2 IMPLANT
NEEDLE ASP BONE MRW 11GX15 (NEEDLE) IMPLANT
NEEDLE ASP BONE MRW 11GX15 J (NEEDLE) ×2 IMPLANT
NEEDLE BONE MARROW 8GAX6 (NEEDLE) IMPLANT
NEEDLE SPNL 18GX3.5 QUINCKE PK (NEEDLE) ×2 IMPLANT
NS IRRIG 1000ML POUR BTL (IV SOLUTION) ×2 IMPLANT
PACK LAMINECTOMY ORTHO (CUSTOM PROCEDURE TRAY) ×2 IMPLANT
PAD ARMBOARD 7.5X6 YLW CONV (MISCELLANEOUS) ×4 IMPLANT
PATTIES SURGICAL .75X.75 (GAUZE/BANDAGES/DRESSINGS) ×2 IMPLANT
PATTIES SURGICAL 1X1 (DISPOSABLE) ×4 IMPLANT
ROD PREBENT EXPEDIUM 6.35 40MM (Rod) ×4 IMPLANT
SCREW POLY EXP 6.35 7X40MM (Screw) ×4 IMPLANT
SCREW POLY EXP 6.35 7X45MM (Screw) ×4 IMPLANT
SCREW SET EXPEDIUM 6.35 (Screw) ×8 IMPLANT
SPONGE GAUZE 4X4 12PLY (GAUZE/BANDAGES/DRESSINGS) ×2 IMPLANT
SPONGE LAP 4X18 X RAY DECT (DISPOSABLE) ×4 IMPLANT
SUT VIC AB 0 CT1 27 (SUTURE) ×1
SUT VIC AB 0 CT1 27XBRD ANBCTR (SUTURE) ×1 IMPLANT
SUT VIC AB 1 CTX 36 (SUTURE) ×2
SUT VIC AB 1 CTX36XBRD ANBCTR (SUTURE) ×2 IMPLANT
SUT VIC AB 2-0 CT1 27 (SUTURE) ×1
SUT VIC AB 2-0 CT1 TAPERPNT 27 (SUTURE) ×1 IMPLANT
SUT VICRYL 0 CT 1 36IN (SUTURE) ×4 IMPLANT
SUT VICRYL 4-0 PS2 18IN ABS (SUTURE) ×4 IMPLANT
SYR CONTROL 10ML LL (SYRINGE) ×4 IMPLANT
TOWEL OR 17X24 6PK STRL BLUE (TOWEL DISPOSABLE) ×2 IMPLANT
TOWEL OR 17X26 10 PK STRL BLUE (TOWEL DISPOSABLE) ×2 IMPLANT
TRAY FOLEY CATH 16FRSI W/METER (SET/KITS/TRAYS/PACK) ×2 IMPLANT
WATER STERILE IRR 1000ML POUR (IV SOLUTION) ×2 IMPLANT
YANKAUER SUCT BULB TIP NO VENT (SUCTIONS) ×2 IMPLANT

## 2013-01-10 NOTE — Anesthesia Postprocedure Evaluation (Signed)
  Anesthesia Post-op Note  Patient: Joel Galloway  Procedure(s) Performed: Procedure(s) with comments: POSTERIOR LUMBAR FUSION 1 LEVEL (N/A) - Decompression L5-S1, Bilateral Microdiscectomy, Right TLIF L5-S1, Cage, Local Bone Graft, Rods and Screw Fixation, Chronos Bone Extendor,   Patient Location: PACU  Anesthesia Type:General  Level of Consciousness: awake and alert   Airway and Oxygen Therapy: Patient Spontanous Breathing  Post-op Pain: mild  Post-op Assessment: Post-op Vital signs reviewed  Post-op Vital Signs: stable  Complications: No apparent anesthesia complications

## 2013-01-10 NOTE — Progress Notes (Signed)
Pt states that he live at a 10 on the pain scale at home and that his pain is never really under control. Will cont to treat pain and monitor.

## 2013-01-10 NOTE — Anesthesia Preprocedure Evaluation (Signed)
Anesthesia Evaluation  Patient identified by MRN, date of birth, ID band Patient awake    Reviewed: Allergy & Precautions, H&P , NPO status , Patient's Chart, lab work & pertinent test results  Airway Mallampati: I TM Distance: >3 FB Neck ROM: Full    Dental   Pulmonary asthma ,          Cardiovascular     Neuro/Psych Anxiety H/O MS    GI/Hepatic   Endo/Other    Renal/GU      Musculoskeletal   Abdominal   Peds  Hematology   Anesthesia Other Findings   Reproductive/Obstetrics                           Anesthesia Physical Anesthesia Plan  ASA: II  Anesthesia Plan: General   Post-op Pain Management:    Induction: Intravenous  Airway Management Planned: Oral ETT  Additional Equipment:   Intra-op Plan:   Post-operative Plan: Extubation in OR  Informed Consent: I have reviewed the patients History and Physical, chart, labs and discussed the procedure including the risks, benefits and alternatives for the proposed anesthesia with the patient or authorized representative who has indicated his/her understanding and acceptance.     Plan Discussed with: CRNA and Surgeon  Anesthesia Plan Comments:         Anesthesia Quick Evaluation

## 2013-01-10 NOTE — Preoperative (Signed)
Beta Blockers   Reason not to administer Beta Blockers:Not Applicable 

## 2013-01-10 NOTE — Op Note (Signed)
01/10/2013  12:47 PM  PATIENT:  Joel Galloway  52 y.o. male  MRN: [id number]  OPERATIVE REPORT  PRE-OPERATIVE DIAGNOSIS:  Herniated Disc L5-S1, Chronic Degenerative Disc Disease L5-S1,   POST-OPERATIVE DIAGNOSIS:  Herniated Disc L5-S1, Chronic Degenerative Disc Disease L5-S1,   PROCEDURE:  Procedure(s): POSTERIOR LUMBAR FUSION 1 LEVEL    SURGEON:  Kerrin Champagne, MD     ASSISTANT: Dr. Glee Arvin, MD.    ANESTHESIA:  General, supplemented with local marcaine with epi 1/200,000, 20cc.    COMPLICATIONS:Central dural tear, 3 mm, repaired with interrupted neurolon and [place].    COMPONENTS: Depuy Expedium pedicle screws and rods L5-S1, Right L5-S1 lordotic depuy 8mm concorde cage with local bone graft. Chronos bone extender, right S1 bone marrow aspiration.  PROCEDURE:The patient was met in the holding area, and the appropriate lumbar level rightt L5-S1  identified and marked with an x and my initials.The patient was then transported to OR. The patient was then placed under  general anesthesia without difficulty.The patient received appropriate preoperative antibiotic prophylaxis ancef.  Nursing staff inserted a Foley catheter under sterile conditions. He was then turned to a prone position [place] spine table was used for this case. All pressure points were well padded PAS stocking applied bilateral lower extremity to prevent DVT. Standard prep DuraPrep solution. Draped in the usual manner. Time-out procedure was called and correct .Spinal needle 18 Gauge placed at the expected L5-S1 levet and lateral C arm used to localize the needle just inferior to the L5-S1 level. The incision was L5-S1 and carried superiorly an additional 2 levels to the L3-4 spinous process and the incision carried. Bovie electric cautery was used to control bleeding and carefully dissection was carried down along the lateral aspects of the spinous process of L3 to S2. Cobb then used to carefully elevate the paralumbar  muscle and the incision in the midline was carried to to the level of the base of the spinous process of L3 to the superior aspect of S2 was carried out. A penfield 4 then placed at the L5-S1 level and  intraoperative lateral confirmation of the appropriate level with C-arm. Osteotome was then used to resect the inferior articular process of right L5 removing with 3 mm Kerrison and Penfield 4 and pituitary rongeur. Carefully the medial aspect of the superior tip of process of S1 was freed up of scar tissue off of the adjacent thecal sac. Osteotome used to perform osteotomy of the superior articular process on the right side at S1 decompressing the lateral recess. This was done using tripod technique and resected using a 3 mm Kerrison and curettes residual bone remaining in the lateral recess was resected using 3mm Kerrisons.  Loupe magnification and headlight were used for this portion of the procedure. 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 The right L5 nerve root well decompressed. Hockey-stick nerve probe was it will be passed out the right L5 neuroforamen and identifying the superior aspect of the S1 pedicle then osteotome used to resect the superior articular process of S1 transversely at the superior level of the S1 pedicle. This allowed for a wide decompression right L5neuroforamen.The central ligamentum flavum was resected And this was carried over the mid portion of the superior SI1 lamina. While resecting the ligamentum flavum from the mid portion of the S1 lamina a dural tear occurred this was likely due to folding of the dural mater while placing the 3 mm  kerrison beneath the central portion of the the superior lamina of S1. The tear was immediately protected with small cottonoid and the laminectomy area hemostasis with bone wax, thrombin soaked gel foam. When the laminotomy site was better hemostasis then the cottonoid removed  and the dural tear evaluated. It was a small midline Longitudinal tear about 3mm in length. The superior aspect of the dural tear was stay sutured and place under traction Further resection of the central superior lamina carried out to expose the tear and provide enough exposure for repair. Suction strength decreased and interrupted 4-0 neurolon sutures used to close the tear. Valsava to +20mm Hg. No CSF leak noted with loopes and headlamp. The repair site then covered with cottonoids and the TLIF and remaining Decompression carried out. The right S1 nerve root and lateral aspect of the thecal sac at the L5-S1 level was then able to be mobilized with Penfield 4 and the disc space at the L5-S1 level identified and the nerve structures retracted with Derricho retractor. 15 blade scalpel used to incise the disc the left side. L5-S1 discectomy on the left sidecarried out with pituitary ronguers. Following debridement of degenerative disc material from the left side L5-S1 disc, the disc space was dilated using 7 mm through 9 mm disc space dilators.  The disc space was then prepared using straight curette up-biting right and up-biting left curette and ring curettes. Degenerated disc as well as a cartilage endplates were debrided from the disc place using pituitary rongeurs,currettes. The disc space examined demonstrating good bleeding endplate bone surfaces. Bone graft that was harvested from the facet was placed into the intervertebral disc space after first sounding the disc space for the correct cage size.Trial cage chosen was a 8mm Concorde lordotic cage. Local bone graft was then impacted into the intervertebral disc space at L5-S1 first placing within the disc space using a forceps then impacted with an 8 mm trial cage. After performing this 3 times  the permanent 8 mm Concorde lordotic cage packed with bone graft was then inserted in approximately 15-20 of convergence. Careful inspection of the spinal canal  demonstrated no surgery bone graft within the spinal canal both the L5 and S1 nerve roots appeared to exit without further compression.  C-arm images were used to confirm the permanent cage implant as well as positioning of the implant well within the intervertebral disc this cage was subset deep to the posterior aspect of the disc space by about 3-4 mm. Attention then turned to performance of insertion of pedicle screws the L5 level.   Normal was then used to make an entry point into the intersection of the lateral aspect of the S1 pedicle with right S1 mid lateral facet at L5-S1 observed on C-arm fluoroscopy to be in good position alignment with the pedicle of S1 observed on lateral view. Handle held pedicle probe was then used to make an opening into the central portions of the pedicle of S1 on the right side. C-arm fluoroscopy used to verify the position alignment of the pedicle probe depth at 40 mm. Trocar used for aspiration of bone marrow was then inserted after first checking the pedicle opening using a ball-tipped probe. Ball-tipped probe indicated that there was no broaching of the cortex within the pedicle opening on the right S1 aspiration equipment was then placed and 10 cc of bone marrow aspirate obtained and was used to charge a 10 cc strip of the chronos calcium oxalate charge collagen sponge. This was then  divided into sections similar to tooth picks for posterior lateral bone graft purposes. Decortication of the sacral alae of S1 was then carried out using a high-speed bur. The bone marrow charged chronos and local bone graft was applied to the area between the transverse process of L5 and the posterior lateral S1 alae. Ball-tipped probe was then used to probe the channel placed on the right side at S1 pedicle again showing no broaching cortex. The 40 mm x 7 pedicle screw was then inserted at the right S1 level in the appropriate degree of convergence and lordosis. Attention then turned to the  right S1 the facet,  the superior articular process was debrided and resected. An entry point into the L5 pedicle identified using the awl at the inferior aspect of the superior articular process of L5. Converging medially C-arm used to verify the alignment for the L5 pedicle. The pedicle was then probed to a depth of 45 mm using the gearshift pedicle probe. Decortication of the transverse process of L5 was carried out using a high-speed bur and bone graft applied. Check made with ball-tip probe to ensure no penetration of the cortex of the L5 pedicle. A 7 mm x 45 mm expediem screw was then inserted degree of lordosis and in the correct degree of convergence. The C-arm was used to verify the position alignment of pedicle screw. A 40 mm precontoured rod was then carefully inserted into the fasteners extending from L 5-S1 on the right side. The fastener caps at L I. and S1 level was then placed and the L5 fastener cap torqued to 80 foot-pounds. Compression between the fasteners L 5 and S1 was then performed and the cap at the S1 level was tightened to 80 foot pounds. Attention then turned to the placement of the pedicle screw on the left side at L 5  this was done similar to the right  side. Awl used to make an initial entry point verified with C-arm fluoroscopy then a hand-held pedicle probe used to probe the pedicle channel, checked with a ball-tip probe and verify no broaching of cortex. Pedicle probe of L5 noted on C-arm fluoroscopy to be in the correct degree of lordosis centered within the pedicle and a length of about 45 mm screw. Tapping was then performed using is 6 mm tap again checking the opening within the pedicle with a pedicle probe to ensure no broaching cortex. 7mm x 45 mm pedicle screw Depuy Expedium type was then inserted into the left L5 pedicle after first decorticating the transverse process of L5. The bone marrow charged chronlos sponge was then inserted between the transverse process of L 5 on  the left side to the upper aspect of the posterior lateral S1 alae. L5 Pedicle screw inserted in excellent position alignment observed with C-arm fluoroscopy. Attention then turned to the left S1 the facet,  the superior articular process was debrided and resected. An entry point into the S1 pedicle identified using the awl at the inferior aspect of the inferior tip of articular process of S1. Converging medially C-arm used to verify the alignment for the S1 pedicle. The pedicle was then probed to a depth of 40 mm using the gearshift pedicle probe. Decortication of the alae was carried out using a high-speed bur and bone graft applied. Check made with ball-tip probe to ensure no penetration of the cortex of the S1 pedicle. A 7 mm x 40 mm expedient screw was then inserted degree of lordosis and in the  correct degree of convergence. The C-arm was used to verify the position alignment of pedicle screw. A 40 mm rod was then inserted into the fasteners extending from L5-S1 the left side. Fastener caps then applied to the S1 and is on pedicle screw fasteners and these were tightened today to 80 foot-pounds. The fastener cap at the S1 level was then inserted and compression obtained between the fastener at L 5 and at S1 and the S1 cap tightened to 80 foot-pounds.L5 and S1 neuroformen were probed with hockey stick neuroprobe and found to be widely patent.  Irrigation was carried out using copious amounts of irrigant solution. Thrombin-soaked Gelfoam were placed for hemostasis was then carefully removed. Duraseal was then applied to the dural repair site and when set up valsava confirmed no leak. Permanent C-arm images were obtained in AP oblique and lateral positions for documentation purposes. They showed the pedicle screws rods to be in good position alignment from L 5-S1  Cell Saver was used during this case however there 450 cc blood loss and a total of 150 cc of Cell Saver blood was returned. The deep paralumbar  muscles with then approximated with 1 Vicryl sutures, the lumbodorsal fascia approximated with 0 and 1 Vicryl sutures. Subcutaneous layers were then approximated with interrupted 0 and 2-0 Vicryl sutures , skin closed with a running subcuticular 4-0 Vicryl suture. The dermabond was then applied. Mepilex bandage was then applied. All instrument and sponge counts were correct. Patient was then returned to the supine position reactivated extubated and returned to the recovery room in satisfactory condition. Dr. Glee Arvin perform the duties of assistant surgeon during this case. He was present from the beginning of the case to the end of the case assisting in transfer the patient from his stretcher to the OR table and back to the stretcher at the end of the case. Assisted in careful retraction and suction of the laminectomy site delicate neural structures.He assisted in the pedicle screw and rod instrumentation and tensioning. He performed closure of the incision from the fascia to the skin applying the dressing.   Joel Galloway,Joel Galloway 01/10/2013, 12:47 PM

## 2013-01-10 NOTE — Progress Notes (Signed)
Pt. Reoriented and reminded numerous times that he needs to lay flat. Pt cont to sits himself up in bed but lays down when gently reminded. Will cont to monitor

## 2013-01-10 NOTE — Brief Op Note (Signed)
01/10/2013  12:22 PM  PATIENT:  [name]  52 y.o. male  PRE-OPERATIVE DIAGNOSIS:  Herniated Disc L5-S1, Chronic Degenerative Disc Disease L5-S1,   POST-OPERATIVE DIAGNOSIS:  Herniated Disc L5-S1, Chronic Degenerative Disc Disease L5-S1,   PROCEDURE:  Procedure(s) with comments: POSTERIOR LUMBAR FUSION 1 LEVEL (N/A) - Decompression L5-S1, Bilateral Microdiscectomy, Right TLIF L5-S1, Cage, Local Bone Graft, Rods and Screw Fixation, Chronos Bone Extendor, Bone Marrow aspiration right S1 transpedicular vertebral body.  SURGEON:  Surgeon(s) and Role:    Kerrin Champagne, MD - Primary    [place] Glee Arvin, MD - Assisting  ASSISTANTS: Dr. Roda Shutters.   ANESTHESIA:   general supplemented with local Marcaine1/2% with epi 20cc., Dr. Michelle Piper.  EBL:  Total I/O In: 2630 [I.V.:2500; Blood:130] Out: 790 [Urine:390; Blood:400]  BLOOD ADMINISTERED:130 CC CELLSAVER  DRAINS: Urinary Catheter (Foley)   LOCAL MEDICATIONS USED:  MARCAINE    and Amount: 20 ml  SPECIMEN:  No Specimen  DISPOSITION OF SPECIMEN:  N/A  COUNTS:  YES  TOURNIQUET:  * No tourniquets in log *  DICTATION: .Dragon Dictation  PLAN OF CARE: Admit to inpatient   PATIENT DISPOSITION:  PACU - hemodynamically stable.   Delay start of Pharmacological VTE agent (>24hrs) due to surgical blood loss or risk of bleeding: yes

## 2013-01-10 NOTE — Transfer of Care (Signed)
Immediate Anesthesia Transfer of Care Note  Patient: Joel Galloway  Procedure(s) Performed: Procedure(s) with comments: POSTERIOR LUMBAR FUSION 1 LEVEL (N/A) - Decompression L5-S1, Bilateral Microdiscectomy, Right TLIF L5-S1, Cage, Local Bone Graft, Rods and Screw Fixation, Chronos Bone Extendor,   Patient Location: PACU  Anesthesia Type:General  Level of Consciousness: awake, alert , oriented and patient cooperative  Airway & Oxygen Therapy: Patient Spontanous Breathing and Patient connected to nasal cannula oxygen  Post-op Assessment: Report given to PACU RN, Post -op Vital signs reviewed and stable and Patient moving all extremities  Post vital signs: Reviewed and stable  Complications: No apparent anesthesia complications

## 2013-01-10 NOTE — Anesthesia Procedure Notes (Signed)
Procedure Name: Intubation Date/Time: 01/10/2013 7:53 AM Performed by: Jerilee Hoh Pre-anesthesia Checklist: Patient identified, Emergency Drugs available, Suction available and Patient being monitored Patient Re-evaluated:Patient Re-evaluated prior to inductionOxygen Delivery Method: Circle system utilized Preoxygenation: Pre-oxygenation with 100% oxygen Intubation Type: IV induction Ventilation: Mask ventilation without difficulty Laryngoscope Size: Mac and 4 Grade View: Grade I Tube type: Oral Tube size: 7.5 mm Number of attempts: 1 Airway Equipment and Method: Stylet Placement Confirmation: ETT inserted through vocal cords under direct vision,  positive ETCO2 and breath sounds checked- equal and bilateral Secured at: 23 cm Tube secured with: Tape Dental Injury: Teeth and Oropharynx as per pre-operative assessment

## 2013-01-11 ENCOUNTER — Encounter (HOSPITAL_COMMUNITY): Payer: Self-pay | Admitting: General Practice

## 2013-01-11 LAB — CBC
HCT: 32.6 % — ABNORMAL LOW (ref 39.0–52.0)
MCH: 32.2 pg (ref 26.0–34.0)
MCHC: 36.2 g/dL — ABNORMAL HIGH (ref 30.0–36.0)
MCV: 89.1 fL (ref 78.0–100.0)
RDW: 12.9 % (ref 11.5–15.5)

## 2013-01-11 LAB — BASIC METABOLIC PANEL
BUN: 10 mg/dL (ref 6–23)
Calcium: 8.2 mg/dL — ABNORMAL LOW (ref 8.4–10.5)
Creatinine, Ser: 0.7 mg/dL (ref 0.50–1.35)
GFR calc Af Amer: >90 mL/min (ref >90)
GFR calc non Af Amer: >90 mL/min (ref >90)

## 2013-01-11 MED FILL — Sodium Chloride IV Soln 0.9%: INTRAVENOUS | Qty: 1000 | Status: AC

## 2013-01-11 MED FILL — Sodium Chloride Irrigation Soln 0.9%: Qty: 3000 | Status: AC

## 2013-01-11 MED FILL — Heparin Sodium (Porcine) Inj 1000 Unit/ML: INTRAMUSCULAR | Qty: 30 | Status: AC

## 2013-01-11 NOTE — Progress Notes (Signed)
Patient reminded numerous times to lay flat on his back as ordered by MD. Patient states "he is unable to lay flat on his back due to other injuries and his MS". Patient requesting to raise [facility] and once again reminded patient of the need to lay flat without raising the [facility]. Offered to log roll patient but he refuses. Patient continues to state his pain is 10 out of 10 and nursing continues to encourage PCA. Spoke with Dr. Ophelia Charter 01/10/13 at 11:55PM regarding patient's pain. Dr. Ophelia Charter suggested giving MS Contin 60mg  along with PCA and Norco (10mg /325). On reassessment of patient's pain, patient rates his pain 10 out of 10. Nursing will continue to monitor and treat pain.

## 2013-01-11 NOTE — Evaluation (Signed)
Physical Therapy Evaluation Patient Details Name: FIN HUPP MRN: [id number] DOB: 12/28/1960 Today's Date: 01/11/2013 Time: 4034-7425 PT Time Calculation (min): 50 min  PT Assessment / Plan / Recommendation History of Present Illness  Pt is a 52 y/o male admitted s/p posterior lumbar fusion L5-S1. PMH includes MS.  Clinical Impression  Pt reports significant pain in bilateral anterior thighs throughout session. Pt states that pain increases sitting with brace donned, and legs "went numb". Pt requires increased cueing to maintain back precautions, and even with cueing continues to frequently twist or flex trunk without warning. This patient is appropriate for skilled PT interventions to address functional limitations, improve safety and independence with functional mobility, and return to PLOF.    PT Assessment  Patient needs continued PT services    Follow Up Recommendations  Home health PT    Does the patient have the potential to tolerate intense rehabilitation      Barriers to Discharge        Equipment Recommendations  3in1 (PT)    Recommendations for Other Services     Frequency 7X/week    Precautions / Restrictions Precautions Precautions: Back Precaution Booklet Issued: Yes (comment) Precaution Comments: Reviewed precautions with patient Required Braces or Orthoses: Spinal Brace Spinal Brace: Lumbar corset;Applied in sitting position Restrictions Weight Bearing Restrictions: No   Pertinent Vitals/Pain Pt reports 7/10 pain at rest, and later reports that leg pain is worse than back pain.      Mobility  Bed Mobility Bed Mobility: Rolling Right;Right Sidelying to Sit;Sitting - Scoot to Edge of Bed Supine to Sit: 4: Min guard (HOB almost flat; pt could not tolerate bed lower than 7 deg) Sitting - Scoot to Edge of Bed: 4: Min guard Details for Bed Mobility Assistance: Pt required frequent verbal cues for sequencing during log roll technique, safety awareness and  maintaining back precautions. Transfers Transfers: Sit to Stand;Stand to Sit Sit to Stand: 4: Min guard;From bed Stand to Sit: 4: Min guard;To chair/3-in-1 Details for Transfer Assistance: Pt required verbal cues for sequencing and safety awareness prior to initiating transfers. Pt with tactlile cueing to maintain back precautions, as pt wanted to bend during stand and sit. Ambulation/Gait Ambulation/Gait Assistance: 4: Min guard Ambulation Distance (Feet): 300 Feet Assistive device: Other (Comment) (occasional use of railings in hallway) Ambulation/Gait Assistance Details: Pt cued to maintain back precautions during gait training, and reports that he feels like he has to "walk the pain out" of his legs. Gait Pattern: Step-through pattern Gait velocity: decreased Stairs: No    Exercises     PT Diagnosis: Difficulty walking;Acute pain  PT Problem List: Decreased strength;Decreased activity tolerance;Decreased mobility;Decreased knowledge of use of DME;Decreased safety awareness;Pain PT Treatment Interventions: DME instruction;Gait training;Stair training;Functional mobility training;Therapeutic activities;Therapeutic exercise;Neuromuscular re-education;Patient/family education     PT Goals(Current goals can be found in the care plan section) Acute Rehab PT Goals Patient Stated Goal: to get back to swimming PT Goal Formulation: With patient/family Time For Goal Achievement: 01/18/13 Potential to Achieve Goals: Good  Visit Information  Last PT Received On: 01/11/13 Assistance Needed: +1 PT/OT Co-Evaluation/Treatment: Yes History of Present Illness: Pt is a 52 y/o male admitted s/p posterior lumbar fusion L5-S1. PMH includes MS.       Prior Functioning  Home Living Family/patient expects to be discharged to:: Private residence Living Arrangements: Spouse/significant other;Children Available Help at Discharge: Family;Available 24 hours/day Type of Home: House Home Access: Ramped  entrance Home Layout: Two level Alternate Level Stairs-Number of Steps:  13 Alternate Level Stairs-Rails: Right Home Equipment: Cane - single point Prior Function Level of Independence: Independent Communication Communication: No difficulties Dominant Hand: Right    Cognition  Cognition Arousal/Alertness: Awake/alert Behavior During Therapy: WFL for tasks assessed/performed Overall Cognitive Status: Within Functional Limits for tasks assessed    Extremity/Trunk Assessment Upper Extremity Assessment Upper Extremity Assessment: Overall WFL for tasks assessed Lower Extremity Assessment Lower Extremity Assessment: RLE deficits/detail;LLE deficits/detail RLE Deficits / Details: Pt reports increased pain in bilateral anterior thigh area reported when pt came to sitting on EOB, and exacerbated by donning of lumbar brace and sitting with lumbar brace on. Ambulating did not change pain, however pt reports he feels the need to keep his LE's moving. Cervical / Trunk Assessment Cervical / Trunk Assessment: Normal   Balance Balance Balance Assessed: Yes Static Sitting Balance Static Sitting - Balance Support: No upper extremity supported;Feet supported Static Sitting - Level of Assistance: 6: Modified independent (Device/Increase time) Static Sitting - Comment/# of Minutes: 3  End of Session PT - End of Session Equipment Utilized During Treatment: Gait belt;Back brace Activity Tolerance: Patient limited by pain Patient left: in chair;with call bell/phone within reach;with family/visitor present Nurse Communication: Mobility status;Patient requests pain meds  GP     Ruthann Cancer 01/11/2013, 3:36 PM  Ruthann Cancer, PT Acute Rehabilitation Services

## 2013-01-11 NOTE — Progress Notes (Signed)
Subjective: 1 Day Post-Op Procedure(s) (LRB): POSTERIOR LUMBAR FUSION 1 LEVEL (N/A) Patient reports pain as moderate.  Better controlled now.  Dull pain to back.  Legs feel better. Voiding well. No nausea.  Objective: Vital signs in last 24 hours: Temp:  [97 F (36.1 C)-98.4 F (36.9 C)] 97.4 F (36.3 C) (08/26 0626) Pulse Rate:  [78-124] 93 (08/26 0626) Resp:  [6-22] 12 (08/26 0636) BP: (78-139)/(33-89) 91/35 mmHg (08/26 0626) SpO2:  [98 %-100 %] 100 % (08/26 0636)  Intake/Output from previous day: 08/25 0701 - 08/26 0700 In: 3130 [I.V.:3000; Blood:130] Out: 1840 [Urine:1440; Blood:400] Intake/Output this shift: Total I/O In: 1865 [I.V.:1865] Out: -    Recent Labs  01/11/13 0520  HGB 11.8*    Recent Labs  01/11/13 0520  WBC 7.7  RBC 3.66*  HCT 32.6*  PLT 237    Recent Labs  01/11/13 0520  NA 135  K 3.7  CL 101  CO2 28  BUN 10  CREATININE 0.70  GLUCOSE 135*  CALCIUM 8.2*   No results found for this basename: LABPT, INR,  in the last 72 hours  ABD soft Neurovascular intact Sensation intact distally Intact pulses distally Incision: dressing C/D/I  Assessment/Plan: 1 Day Post-Op Procedure(s) (LRB): POSTERIOR LUMBAR FUSION 1 LEVEL (N/A) Up with therapy brace when out of bed today Hold lisinopril Slow to advance diet and monitor bowel function  Mubarak Bevens M 01/11/2013, 7:49 AM

## 2013-01-11 NOTE — Progress Notes (Signed)
UR COMPLETED  

## 2013-01-11 NOTE — Progress Notes (Signed)
Orthopedic Tech Progress Note Patient Details:  Joel Galloway [birth year] [id number]  Patient ID: [name], male   DOB: 1960/10/07, 52 y.o.   MRN: [id number] Brace order completed by Storm Frisk, Aviraj Kentner 01/11/2013, 1:20 PM

## 2013-01-11 NOTE — Progress Notes (Signed)
01/11/13 HHPT ordered,spoke with patient about HHC, he selected [facility] from the [employer] agencies list. Dava Najjar with [facility] and set up HHPT. Will check with PT for any equipment needs. Jacquelynn Cree RN, BSN, CCM

## 2013-01-11 NOTE — Evaluation (Addendum)
Occupational Therapy Evaluation Patient Details Name: Joel Galloway MRN: [id number] DOB: 24-May-1960 Today's Date: 01/11/2013 Time: 4098-1191 OT Time Calculation (min): 42 min  OT Assessment / Plan / Recommendation History of present illness Pt is a 52 y/o male admitted s/p posterior lumbar fusion L5-S1. PMH includes MS.   Clinical Impression   Pt presents with below problem list. Pt reports significant pain in bilateral anterior thighs throughout session. Pt states that pain increases sitting with brace donned, and legs "went numb". Pt requires increased cueing to maintain back precautions, and even with cueing continues to frequently twist or flex trunk without warning. Pt independent with ADLs, PTA. This patient is appropriate for skilled OT interventions to increase independence and safety prior to d/c.     OT Assessment  Patient needs continued OT Services    Follow Up Recommendations  Home health OT;Supervision/Assistance - 24 hour    Barriers to Discharge      Equipment Recommendations  3 in 1 bedside comode    Recommendations for Other Services    Frequency  Min 2X/week    Precautions / Restrictions Precautions Precautions: Back Precaution Booklet Issued: Yes (comment) Precaution Comments: Reviewed precautions with patient Required Braces or Orthoses: Spinal Brace Spinal Brace: Lumbar corset;Applied in sitting position Restrictions Weight Bearing Restrictions: No   Pertinent Vitals/Pain Reports that leg pain is worse than back pain. Repositioned.      ADL  Eating/Feeding: Independent Where Assessed - Eating/Feeding: Chair Grooming: Performed;Teeth care;Min guard Where Assessed - Grooming: Supported standing Upper Body Bathing: Supervision/safety;Set up Where Assessed - Upper Body Bathing: Unsupported sitting Lower Body Bathing: Maximal assistance Where Assessed - Lower Body Bathing: Supported sit to stand Upper Body Dressing: Supervision/safety;Set up Where  Assessed - Upper Body Dressing: Unsupported sitting Lower Body Dressing: Maximal assistance Where Assessed - Lower Body Dressing: Supported sit to stand Toilet Transfer: Hydrographic surveyor Method: Other (comment) (stood at toilet to [employer] with him (min guard)) Acupuncturist: Comfort height toilet Toileting - Clothing Manipulation and Hygiene: Performed;Min guard (stood and urinated at toilet) Where Assessed - Engineer, mining and Hygiene: Standing Tub/Shower Transfer Method: Not assessed Equipment Used: Gait belt;Back brace Transfers/Ambulation Related to ADLs: Min guard for ambulation and transfers. ADL Comments: Pt ambulated in hallway and was complaining of leg pain during session. OT educated that AE is available for LB ADLs. OT also discussed use of toilet aide while pt was walking and explained how hygiene is sometimes difficult to perform while maintaining back precautions.  OT also educated to stand in front of chair/bed when pulling up pants/underwear for safety. Pt performed teeth care at sink and OT educated on two cup method to avoid breaking precautions-pt used two cups. Cues to maintain precautions during session.  Pt also ambulated to toilet to stand to urinate-cues not to bend.  OT also educated on back brace and to not twist when positoning it. Pt unable to cross legs over knees for LB ADLs.    OT Diagnosis: Acute pain  OT Problem List: Decreased strength;Decreased activity tolerance;Impaired balance (sitting and/or standing);Decreased safety awareness;Decreased knowledge of use of DME or AE;Decreased knowledge of precautions;Pain;Decreased range of motion OT Treatment Interventions: Self-care/ADL training;DME and/or AE instruction;Therapeutic activities;Patient/family education;Balance training   OT Goals(Current goals can be found in the care plan section) Acute Rehab OT Goals Patient Stated Goal: to get back to swimming OT Goal  Formulation: With patient Time For Goal Achievement: 01/18/13 Potential to Achieve Goals: Good ADL Goals Pt Will  Perform Grooming: with modified independence;standing (maintaining back precautions) Pt Will Perform Lower Body Bathing: with modified independence;with adaptive equipment;sit to/from stand (maintaining back precautions) Pt Will Perform Lower Body Dressing: with modified independence;with adaptive equipment;sit to/from stand (maintaining back precautions) Pt Will Transfer to Toilet: with modified independence;ambulating (3 in 1 over commode) Pt Will Perform Toileting - Clothing Manipulation and hygiene: with modified independence;sit to/from stand;sitting/lateral leans;with adaptive equipment Pt Will Perform Tub/Shower Transfer: Shower transfer;with supervision;ambulating (shower equipment tbd) Additional ADL Goal #1: Pt will be able to don/doff back brace independently while maintaining back precautions.  Visit Information  Last OT Received On: 01/11/13 Assistance Needed: +1 History of Present Illness: Pt is a 52 y/o male admitted s/p posterior lumbar fusion L5-S1. PMH includes MS.       Prior Functioning     Home Living Family/patient expects to be discharged to:: Private residence Living Arrangements: Spouse/significant other;Children Available Help at Discharge: Family;Available 24 hours/day Type of Home: House Home Access: Ramped entrance Home Layout: Two level Alternate Level Stairs-Number of Steps: 13 Alternate Level Stairs-Rails: Right Home Equipment: Cane - single point Prior Function Level of Independence: Independent Communication Communication: No difficulties Dominant Hand: Right         Vision/Perception     Cognition  Cognition Arousal/Alertness: Awake/alert Behavior During Therapy: Anxious Overall Cognitive Status: Within Functional Limits for tasks assessed    Extremity/Trunk Assessment Upper Extremity Assessment Upper Extremity  Assessment: Overall WFL for tasks assessed Cervical / Trunk Assessment Cervical / Trunk Assessment: Normal     Mobility Bed Mobility Bed Mobility: Rolling Right;Right Sidelying to Sit;Sitting - Scoot to Edge of Bed Rolling Right: 4: Min guard;Other (comment) (HOB almost flat; pt could not tolerate lower than 7 degrees) Right Sidelying to Sit: 4: Min guard Sitting - Scoot to Edge of Bed: 4: Min guard Details for Bed Mobility Assistance: Pt required frequent verbal cues for sequencing during log roll technique, safety awareness and maintaining back precautions. Transfers Transfers: Sit to Stand;Stand to Sit Sit to Stand: 4: Min guard;From bed Stand to Sit: 4: Min guard;To chair/3-in-1 Details for Transfer Assistance: Pt required verbal cues for sequencing and safety awareness prior to initiating transfers. Pt with tactlile cueing to maintain back precautions, as pt wanted to bend during stand and sit.     Exercise        End of Session OT - End of Session Equipment Utilized During Treatment: Gait belt;Back brace Activity Tolerance: Patient limited by pain Patient left: in chair;with family/visitor present;Other (comment) (with PT)  GO     [name] OTR/L [phone] 01/11/2013, 4:41 PM

## 2013-01-11 NOTE — Progress Notes (Signed)
Orthopedic Tech Progress Note Patient Details:  Joel Galloway [birth year] [id number] [employer] called for brace order Patient ID: Joel Galloway, male   DOB: 10-Aug-1960, 52 y.o.   MRN: [id number]   Orie Rout 01/11/2013, 11:30 AM

## 2013-01-12 DIAGNOSIS — M48062 Spinal stenosis, lumbar region with neurogenic claudication: Secondary | ICD-10-CM | POA: Diagnosis present

## 2013-01-12 DIAGNOSIS — M51369 Other intervertebral disc degeneration, lumbar region without mention of lumbar back pain or lower extremity pain: Secondary | ICD-10-CM | POA: Diagnosis present

## 2013-01-12 DIAGNOSIS — M5136 Other intervertebral disc degeneration, lumbar region: Secondary | ICD-10-CM | POA: Diagnosis present

## 2013-01-12 DIAGNOSIS — M5126 Other intervertebral disc displacement, lumbar region: Secondary | ICD-10-CM | POA: Diagnosis present

## 2013-01-12 MED ORDER — LINACLOTIDE 145 MCG PO CAPS
290.0000 ug | ORAL_CAPSULE | Freq: Every day | ORAL | Status: DC
  Administered 2013-01-13: 290 ug via ORAL
  Filled 2013-01-12 (×2): qty 2

## 2013-01-12 MED ORDER — ONDANSETRON HCL 4 MG/2ML IJ SOLN
4.0000 mg | Freq: Four times a day (QID) | INTRAMUSCULAR | Status: DC | PRN
  Administered 2013-01-13: 4 mg via INTRAVENOUS
  Filled 2013-01-12: qty 2

## 2013-01-12 MED ORDER — DIAZEPAM 5 MG/ML IJ SOLN: 10.0000 mg | Freq: Four times a day (QID) | INTRAMUSCULAR | Status: DC | PRN

## 2013-01-12 MED ORDER — LORAZEPAM 2 MG/ML IJ SOLN
1.0000 mg | Freq: Four times a day (QID) | INTRAMUSCULAR | Status: DC | PRN
Start: 2013-01-12 — End: 2013-01-13
  Administered 2013-01-12: 1 mg via INTRAVENOUS
  Filled 2013-01-12: qty 1

## 2013-01-12 MED ORDER — TESTOSTERONE 50 MG/5GM (1%) TD GEL: 5.0000 g | Freq: Every day | TRANSDERMAL | Status: DC

## 2013-01-12 MED ORDER — DIPHENHYDRAMINE HCL 12.5 MG/5ML PO ELIX: 12.5000 mg | ORAL_SOLUTION | Freq: Four times a day (QID) | ORAL | Status: DC | PRN

## 2013-01-12 MED ORDER — DIAZEPAM 5 MG PO TABS: 10.0000 mg | ORAL_TABLET | Freq: Four times a day (QID) | ORAL | Status: DC | PRN

## 2013-01-12 MED ORDER — NALOXONE HCL 0.4 MG/ML IJ SOLN: 0.4000 mg | INTRAMUSCULAR | Status: DC | PRN

## 2013-01-12 MED ORDER — NON FORMULARY: Freq: Two times a day (BID) | Status: DC

## 2013-01-12 MED ORDER — DIPHENHYDRAMINE HCL 50 MG/ML IJ SOLN: 12.5000 mg | Freq: Four times a day (QID) | INTRAMUSCULAR | Status: DC | PRN

## 2013-01-12 MED ORDER — ALBUTEROL SULFATE (5 MG/ML) 0.5% IN NEBU
INHALATION_SOLUTION | RESPIRATORY_TRACT | Status: AC
  Administered 2013-01-12: 21:00:00
  Filled 2013-01-12: qty 0.5

## 2013-01-12 MED ORDER — PROMETHAZINE HCL 25 MG RE SUPP: 25.0000 mg | Freq: Four times a day (QID) | RECTAL | Status: DC | PRN

## 2013-01-12 MED ORDER — MORPHINE BOLUS VIA INFUSION: 2.0000 mg | INTRAVENOUS | Status: DC | PRN

## 2013-01-12 MED ORDER — MORPHINE SULFATE 2 MG/ML IJ SOLN: 2.0000 mg | INTRAMUSCULAR | Status: DC | PRN

## 2013-01-12 MED ORDER — MORPHINE SULFATE (PF) 1 MG/ML IV SOLN
INTRAVENOUS | Status: DC
  Administered 2013-01-13: 24 mg via INTRAVENOUS
  Administered 2013-01-13: 7.5 mg via INTRAVENOUS
  Administered 2013-01-13 (×2): via INTRAVENOUS
  Filled 2013-01-12 (×2): qty 25

## 2013-01-12 MED ORDER — PROMETHAZINE HCL 25 MG PO TABS
25.0000 mg | ORAL_TABLET | Freq: Four times a day (QID) | ORAL | Status: DC | PRN
  Administered 2013-01-12 (×2): 25 mg via ORAL
  Filled 2013-01-12 (×2): qty 1

## 2013-01-12 MED ORDER — SODIUM CHLORIDE 0.9 % IJ SOLN: 9.0000 mL | INTRAMUSCULAR | Status: DC | PRN

## 2013-01-12 MED ORDER — SUMATRIPTAN SUCCINATE 6 MG/0.5ML ~~LOC~~ SOLN
6.0000 mg | SUBCUTANEOUS | Status: DC | PRN
  Administered 2013-01-12: 6 mg via SUBCUTANEOUS
  Filled 2013-01-12: qty 0.5

## 2013-01-12 MED FILL — Linaclotide Cap 290 MCG: ORAL | Qty: 1 | Status: AC

## 2013-01-12 NOTE — Progress Notes (Signed)
Pharmacy  Re: Imitirex for migraine  Spoke with patient about prior Imitrex used. Usually just needs one dose of 6 mg SQ, but occasionally needs repeat dose.  Max dose 12 mg/24 hrs.   First dose provided.  Nicolette Bang, RPh Pager: [phone] 01/12/2013. 3:28 PM

## 2013-01-12 NOTE — Progress Notes (Signed)
Physical Therapy Treatment Patient Details Name: Joel Galloway MRN: [id number] DOB: 1960-11-24 Today's Date: 01/12/2013 Time: 7829-5621 PT Time Calculation (min): 35 min  PT Assessment / Plan / Recommendation  History of Present Illness Pt is a 52 y/o male admitted s/p posterior lumbar fusion L5-S1. PMH includes MS.   PT Comments   Pt progressing with mobility. Continues to have difficulty adhering to back precautions with bed mobility and during amb. Pt slightly unsteady with gt; would benefit from [facility] to increase stability vs. amb with IV pole. Pt c/o pain 9/10 is using PCA PRN for pain.   Follow Up Recommendations  Home health PT     Does the patient have the potential to tolerate intense rehabilitation     Barriers to Discharge        Equipment Recommendations  3in1 (PT)    Recommendations for Other Services    Frequency Min 5X/week   Progress towards PT Goals Progress towards PT goals: Progressing toward goals  Plan Frequency needs to be updated    Precautions / Restrictions Precautions Precautions: Back Precaution Booklet Issued: Yes (comment) Precaution Comments: pt unable to recall back precautions; pt impulsive with transfers and twists with bed mobility and during amb  Required Braces or Orthoses: Spinal Brace Spinal Brace: Lumbar corset;Applied in sitting position Restrictions Weight Bearing Restrictions: No   Pertinent Vitals/Pain 9/10; PCA PRN  For pain     Mobility  Bed Mobility Bed Mobility: Sit to Supine;Sit to Sidelying Left;Rolling Right Rolling Right: 5: Supervision;With rail Supine to Sit: 5: Supervision;HOB elevated Sitting - Scoot to Edge of Bed: 5: Supervision;With rail Sit to Supine: 5: Supervision;HOB elevated Sit to Sidelying Left: 5: Supervision;HOB flat;With rail Details for Bed Mobility Assistance: pt encouraged to perform log rolling technique for bed mobility; with sit to supine pt was insistent on performing bed mobility his way; when  returned to bed pt was agreeable to perform log rolling technique to adhere to back precautions; pt relies heavily on handrails and HOB being elevated. (reports his bed at home does elevate)   Transfers Transfers: Sit to Stand;Stand to Sit Sit to Stand: 5: Supervision;From bed Stand to Sit: 5: Supervision;To bed Details for Transfer Assistance: supervision for safety; vc's to adhere to back precautions; pt is impulsive with transfers  Ambulation/Gait Ambulation/Gait Assistance: 5: Supervision Ambulation Distance (Feet): 500 Feet Assistive device: Other (Comment) (IV pole ) Ambulation/Gait Assistance Details: pt reports he feels more stable amb with IV pole; pt would benefit from [facility] to amb with to increase stabilty; pt demo decreased safety awareness and twists with amb Gait Pattern: Step-through pattern Gait velocity: decreased Stairs: No Wheelchair Mobility Wheelchair Mobility: No         PT Diagnosis:    PT Problem List:   PT Treatment Interventions:     PT Goals (current goals can now be found in the care plan section) Acute Rehab PT Goals Patient Stated Goal: to get back to swimming PT Goal Formulation: With patient/family Time For Goal Achievement: 01/18/13 Potential to Achieve Goals: Good  Visit Information  Last PT Received On: 01/12/13 Assistance Needed: +1 History of Present Illness: Pt is a 52 y/o male admitted s/p posterior lumbar fusion L5-S1. PMH includes MS.    Subjective Data  Subjective: "can you organize this room. I need to walk and get my strength back but i need to go to the bathroom"  Patient Stated Goal: to get back to swimming   Cognition  Cognition Arousal/Alertness: Awake/alert Behavior  During Therapy: Anxious;Impulsive Overall Cognitive Status: Within Functional Limits for tasks assessed    Balance  Balance Balance Assessed: Yes Static Standing Balance Static Standing - Balance Support: No upper extremity supported;During functional  activity Static Standing - Level of Assistance: 5: Stand by assistance Static Standing - Comment/# of Minutes: pt performed ADL's standing at sink with SBA ; tolerated static standing ~6 min   End of Session PT - End of Session Equipment Utilized During Treatment: Gait belt;Back brace Activity Tolerance: Patient tolerated treatment well Patient left: in bed;with call bell/phone within reach;with family/visitor present Nurse Communication: Mobility status;Patient requests pain meds   GP     Joel Galloway, [place] [phone] 01/12/2013, 8:43 AM

## 2013-01-12 NOTE — Progress Notes (Addendum)
Occupational Therapy Treatment Patient Details Name: Joel Galloway MRN: [id number] DOB: 18-May-1961 Today's Date: 01/12/2013 Time: 1191-4782 OT Time Calculation (min): 25 min  OT Assessment / Plan / Recommendation  History of present illness Pt is a 52 y/o male admitted s/p posterior lumbar fusion L5-S1. PMH includes MS.   OT comments  Pt progressing towards goals. OT educated on AE, pt practiced toilet transfer on 3 in 1, donning/doffing brace,bed mobility, practiced LB dressing and OT came back in and talked about shower transfer. Several cues to maintain precautions during session.    Follow Up Recommendations  Home health OT;Supervision/Assistance - 24 hour    Barriers to Discharge       Equipment Recommendations  3 in 1 bedside comode    Recommendations for Other Services    Frequency Min 2X/week   Progress towards OT Goals Progress towards OT goals: Progressing toward goals  Plan Discharge plan remains appropriate    Precautions / Restrictions Precautions Precautions: Back Precaution Booklet Issued: No Precaution Comments: several cues during session to maintain-reviewed precautions with patient Required Braces or Orthoses: Spinal Brace Spinal Brace: Lumbar corset;Applied in sitting position Restrictions Weight Bearing Restrictions: No   Pertinent Vitals/Pain Pain 8/10. C/o migraine, leg pain, and back pain. Pt left in bed in sidelying position. Using PCA as needed.     ADL  Lower Body Dressing: Supervision/safety Where Assessed - Lower Body Dressing: Unsupported sitting Toilet Transfer: Min guard Toilet Transfer Method: Sit to stand Toilet Transfer Equipment: Raised toilet seat with arms (or 3-in-1 over toilet) Toileting - Clothing Manipulation and Hygiene: Simulated;Supervision/safety Where Assessed - Toileting Clothing Manipulation and Hygiene: Sit on 3-in-1 or toilet;Sit to stand from 3-in-1 or toilet Equipment Used: Back brace;Sock aid;Reacher;Long-handled  sponge;Long-handled shoe horn;Other (comment) (toilet aide) Transfers/Ambulation Related to ADLs: Min guard ADL Comments: Pt able to don/doff socks and bedroom shoes by crossing legs and OT provided cues to maintain precautions and educated how to avoid twisting. OT showed pt AE at beginning of session as he was unable to cross legs yesterday (may still be beneficial due to MS).  Pt simulated wiping and OT educated to be sure not to break precautions.  Educated on usefulness of reacher in using it to pick up items.  OT also explained benefit of having bed as flat as possible for bed mobility. Pt donned brace with cues to maintain precautions. OT went back in room and talked about shower- explained shower transfer technique and using 3 in 1 in shower. Will practice in later session.     OT Diagnosis:    OT Problem List:   OT Treatment Interventions:     OT Goals(current goals can now be found in the care plan section) Acute Rehab OT Goals Patient Stated Goal: to get back to swimming OT Goal Formulation: With patient Time For Goal Achievement: 01/18/13 Potential to Achieve Goals: Good ADL Goals Pt Will Perform Grooming: with modified independence;standing (maintaining back precautions) Pt Will Perform Lower Body Bathing: with modified independence;with adaptive equipment;sit to/from stand (maintaining back precautions) Pt Will Perform Lower Body Dressing: with modified independence;with adaptive equipment;sit to/from stand (maintaining back precautions) Pt Will Transfer to Toilet: with modified independence;ambulating (3 in 1 over commode) Pt Will Perform Toileting - Clothing Manipulation and hygiene: with modified independence;sit to/from stand;sitting/lateral leans;with adaptive equipment Pt Will Perform Tub/Shower Transfer: Shower transfer;with supervision;ambulating (shower equipment tbd) Additional ADL Goal #1: Pt will be able to don/doff back brace independently while maintaining back  precautions.  Visit Information  Last OT Received On: 01/12/13 Assistance Needed: +1 History of Present Illness: Pt is a 52 y/o male admitted s/p posterior lumbar fusion L5-S1. PMH includes MS.    Subjective Data      Prior Functioning       Cognition  Cognition Arousal/Alertness: Awake/alert Behavior During Therapy: Anxious;Impulsive Overall Cognitive Status: Within Functional Limits for tasks assessed    Mobility  Bed Mobility Bed Mobility: Sit to Sidelying Left;Sitting - Scoot to Edge of Bed;Left Sidelying to Sit Left Sidelying to Sit: 5: Supervision;HOB elevated Sitting - Scoot to Edge of Bed: 5: Supervision Sit to Sidelying Left: 5: Supervision;HOB elevated Details for Bed Mobility Assistance: pt encouraged to lower HOB for bed mobility. Cues to maintain precautions. Transfers Transfers: Sit to Stand;Stand to Sit Sit to Stand: 4: Min guard;With upper extremity assist;From bed;From chair/3-in-1;5: Supervision Stand to Sit: 4: Min guard;With upper extremity assist;To chair/3-in-1 Details for Transfer Assistance: Cues for hand placement and to back all the way until legs touch on 3 in 1 before sitting. Pt standing from recliner chair before OT was beside him.    Exercises      Balance     End of Session OT - End of Session Equipment Utilized During Treatment: Back brace Activity Tolerance: Patient limited by pain Patient left: in bed;with call bell/phone within reach;with family/visitor present Nurse Communication: Other (comment) (spoke about PCA and leaking IV as well as pt's pain)  GO     Joel Galloway  OTR/L [phone]  01/12/2013, 1:22 PM

## 2013-01-12 NOTE — Progress Notes (Signed)
Subjective: 2 Days Post-Op Procedure(s) (LRB): POSTERIOR LUMBAR FUSION 1 LEVEL (N/A) Patient reports pain as moderate.  Wants to stay on IV meds until back on all pre op pain meds and muscle relaxants.  Walking in hallway this am and states "couldnt walk this far before surgery.  Still some leg aching but much improved from pre op Not moving bowels.  Has chronic constipation with MS.  Want to make sure all home meds restarted.  Objective: Vital signs in last 24 hours: Temp:  [98 F (36.7 C)-98.4 F (36.9 C)] 98.4 F (36.9 C) (08/27 0610) Pulse Rate:  [94-101] 101 (08/27 0610) Resp:  [10-18] 16 (08/27 0650) BP: (100-116)/(53-66) 116/54 mmHg (08/27 0610) SpO2:  [96 %-100 %] 98 % (08/27 0650)  Intake/Output from previous day: 08/26 0701 - 08/27 0700 In: 3065 [I.V.:3065] Out: 250 [Urine:250] Intake/Output this shift:     Recent Labs  01/11/13 0520  HGB 11.8*    Recent Labs  01/11/13 0520  WBC 7.7  RBC 3.66*  HCT 32.6*  PLT 237    Recent Labs  01/11/13 0520  NA 135  K 3.7  CL 101  CO2 28  BUN 10  CREATININE 0.70  GLUCOSE 135*  CALCIUM 8.2*   No results found for this basename: LABPT, INR,  in the last 72 hours  Neurovascular intact Sensation intact distally Intact pulses distally Dorsiflexion/Plantar flexion intact Incision: no drainage Brace fits well.  Assessment/Plan: 2 Days Post-Op Procedure(s) (LRB): POSTERIOR LUMBAR FUSION 1 LEVEL (N/A) Up with therapy Plan for discharge tomorrow Address pain meds and laxatives today to resume all pre op meds.  Joel Galloway M 01/12/2013, 8:18 AM

## 2013-01-12 NOTE — Progress Notes (Signed)
Continued reinforcement of back precaution importance provided as patient was curling up with his legs towards his chest overnight.  Nursing will continue to monitor and redirect as needed.

## 2013-01-12 NOTE — Progress Notes (Signed)
Patient examined and lab reviewed with Vernon PA-C. 

## 2013-01-13 LAB — BASIC METABOLIC PANEL
Calcium: 8.9 mg/dL (ref 8.4–10.5)
Chloride: 100 mEq/L (ref 96–112)
Creatinine, Ser: 0.63 mg/dL (ref 0.50–1.35)
GFR calc Af Amer: >90 mL/min (ref >90)
Sodium: 136 mEq/L (ref 135–145)

## 2013-01-13 LAB — CBC
MCV: 90.8 fL (ref 78.0–100.0)
Platelets: 285 10*3/uL (ref 150–400)
RDW: 13.2 % (ref 11.5–15.5)
WBC: 7.9 10*3/uL (ref 4.0–10.5)

## 2013-01-13 MED ORDER — HYDROCODONE-ACETAMINOPHEN 10-325 MG PO TABS: 1.0000 | ORAL_TABLET | ORAL | Status: DC | PRN

## 2013-01-13 MED ORDER — MORPHINE SULFATE ER 15 MG PO TBCR: 75.0000 mg | EXTENDED_RELEASE_TABLET | Freq: Three times a day (TID) | ORAL | Status: DC

## 2013-01-13 MED ORDER — OXYCODONE HCL 5 MG PO TABS
5.0000 mg | ORAL_TABLET | ORAL | Status: DC | PRN
  Administered 2013-01-13: 5 mg via ORAL
  Filled 2013-01-13: qty 1

## 2013-01-13 MED ORDER — CARISOPRODOL 350 MG PO TABS: 350.0000 mg | ORAL_TABLET | Freq: Four times a day (QID) | ORAL | Status: DC | PRN

## 2013-01-13 MED ORDER — HYDROCODONE-ACETAMINOPHEN 10-325 MG PO TABS
1.0000 | ORAL_TABLET | ORAL | Status: DC | PRN
  Administered 2013-01-13: 1 via ORAL
  Filled 2013-01-13: qty 1

## 2013-01-13 MED ORDER — MORPHINE SULFATE ER 60 MG PO TBCR
75.0000 mg | EXTENDED_RELEASE_TABLET | ORAL | Status: DC
  Administered 2013-01-13: 75 mg via ORAL
  Filled 2013-01-13: qty 5

## 2013-01-13 MED ORDER — MORPHINE SULFATE ER 100 MG PO TBCR: 100.0000 mg | EXTENDED_RELEASE_TABLET | Freq: Three times a day (TID) | ORAL | Status: DC | PRN

## 2013-01-13 NOTE — Progress Notes (Signed)
Patient ID: Joel Galloway, male   DOB: [birth year], 52 y.o.   MRN: [id number] Subjective: 3 Days Post-Op Procedure(s) (LRB): POSTERIOR LUMBAR FUSION 1 LEVEL (N/A) Episode of SOB, itching and skin irritation last night following discontinuing PCA. PCA was restarted and he was given benadryl. Today he is walking in the hallway No limp list or pelvic obliquity.    Patient reports pain as mild.    Objective:   VITALS:  Temp:  [98.5 F (36.9 C)-99.5 F (37.5 C)] 98.5 F (36.9 C) (08/28 0646) Pulse Rate:  [101-128] 121 (08/28 0646) Resp:  [8-18] 14 (08/28 0849) BP: (109-137)/(54-99) 119/68 mmHg (08/28 0646) SpO2:  [93 %-100 %] 98 % (08/28 0849) Weight:  [77.8 kg (171 lb 8.3 oz)] 77.8 kg (171 lb 8.3 oz) (08/27 1500)  Neurologically intact ABD soft Sensation intact distally Intact pulses distally Incision: no drainage No cellulitis present Mild swelling probably due to hematoma/seroma, no signs of CSF leak.   LABS  Recent Labs  01/11/13 0520  HGB 11.8*  WBC 7.7  PLT 237    Recent Labs  01/11/13 0520  NA 135  K 3.7  CL 101  CO2 28  BUN 10  CREATININE 0.70  GLUCOSE 135*   No results found for this basename: LABPT, INR,  in the last 72 hours   Assessment/Plan: 3 Days Post-Op Procedure(s) (LRB): POSTERIOR LUMBAR FUSION 1 LEVEL (N/A) Patient believes allergic reaction may have been food related.  Advance diet Up with therapy D/C IV fluids Discharge home with home health if able to tolerate po meds Patient requests epi pen incase of episode of SOB and reaction.  Siddh Vandeventer,Kenry E 01/13/2013, 8:58 AM

## 2013-01-13 NOTE — Progress Notes (Signed)
Patient ID: Joel Galloway, male   DOB: August 07, 1960, 52 y.o.   MRN: [id number] Pain improved with increased MS Contin and break through meds. Up and walking in the room and Hallway. I spoke with his pain management specialist Dr. Abigail Butts who is located in [place], he was in [place], [place] at the time but was Able to discuss [name]'s current meds, He recommended that he remain on the Increased dose of MS Contin 75mg  every 8 hours (not Opana) and hydrocodone 10/325mg  Every 4 hours for break through pain. [name] also notes that his muscle relaxer is nearly Out so a prescription for Soma 350 mg every 6 hours will be written. Will discharge home. Home health to follow up at home. Return to my office in one week to recheck incision and ensure no recurrent dural leak. Dr. Abigail Butts requests he follow up with him in 3-4 weeks.

## 2013-01-13 NOTE — Progress Notes (Signed)
Occupational Therapy Treatment Patient Details Name: Joel Galloway MRN: [id number] DOB: Oct 07, 1960 Today's Date: 01/13/2013 Time: 4098-1191 OT Time Calculation (min): 23 min  OT Assessment / Plan / Recommendation  History of present illness Pt is a 52 y/o male admitted s/p posterior lumbar fusion L5-S1. PMH includes MS.   OT comments  Pt overall supervision level for toileting transfers (performed) and grooming standing at sink. Shower transfers also supervision level as simulated in rehab gym today. Pt ambulated from room to rehab gym for simulated shower transfer using side stepping and forward stepping technique on stairs in gym again at supervision level. He pushed IV pole back to room (supervision-Mod I) where he was educated in and issued handout for back precautions and this was reviewed with pt in pt's room (front & back of handout). He will benefit from [facility] secondary to recent surgery & anxious/implusive at times.  Follow Up Recommendations  Home health OT;Supervision/Assistance - 24 hour    Barriers to Discharge       Equipment Recommendations  3 in 1 bedside comode    Recommendations for Other Services    Frequency Min 2X/week   Progress towards OT Goals Progress towards OT goals: Progressing toward goals  Plan Discharge plan remains appropriate    Precautions / Restrictions Precautions Precautions: Back Precaution Comments:   Required Braces or Orthoses: Spinal Brace Spinal Brace: Lumbar corset;Applied in sitting position;Other (comment) (Pt demonstrates independence don/doff corset) Restrictions Weight Bearing Restrictions: No   Pertinent Vitals/Pain Pt does not rate pain "I have pain, but I'll have to think about it and let you know later at a later time." Pt w/o facial grimace, SOB or holding breath throughout treatment session noted.    ADL  Eating/Feeding: Performed;Independent Where Assessed - Eating/Feeding: Other (comment) (Standing in room at  counter) Grooming: Performed;Wash/dry hands;Wash/dry face;Teeth care;Modified independent Where Assessed - Grooming: Unsupported standing Toilet Transfer: Research scientist (life sciences) Method: Sit to Barista: Raised toilet seat with arms (or 3-in-1 over toilet) Toileting - Clothing Manipulation and Hygiene: Performed;Supervision/safety Where Assessed - Engineer, mining and Hygiene: Standing Tub/Shower Transfer: Landscape architect Method: Ambulating;Other (comment) (Pt amb to gym & side stepped on stairs to simulate transfer) Tub/Shower Transfer Equipment: Other (comment) (Simulated shower transfer (side stepping up/down stairs)) Equipment Used: Back brace Transfers/Ambulation Related to ADLs: Supervision level, pt pushes IV pole during functional mobility.  ADL Comments: Pt overall supervision level for toileting transfers (performed) and grooming standing at sink. Shower transfers also supervision level as simulated in rehab gym today. Pt ambulated from room to rehab gym for simulated shower transfer using side stepping and forward stepping technique on stairs in gym again at supervision level. He pushed IV pole back to room (supervision-Mod I) where he was educated in and issued handout for back precautions and this was reviewed with pt in pt's room (front & back of handout).     OT Diagnosis:    OT Problem List:   OT Treatment Interventions:     OT Goals(current goals can now be found in the care plan section) Acute Rehab OT Goals Patient Stated Goal: to go home   Visit Information  Last OT Received On: 01/13/13 Assistance Needed: +1 History of Present Illness: Pt is a 52 y/o male admitted s/p posterior lumbar fusion L5-S1. PMH includes MS.    Subjective Data      Prior Functioning       Cognition  Cognition Arousal/Alertness: Awake/alert Behavior During Therapy: Anxious Overall  Cognitive  Status: Within Functional Limits for tasks assessed    Mobility  Bed Mobility Bed Mobility: Not assessed (Pt up with RN student upon OT arrival) Transfers Transfers: Sit to Stand;Stand to Sit Sit to Stand: 5: Supervision;From bed;From chair/3-in-1 Stand to Sit: 5: Supervision;To bed;To chair/3-in-1 Details for Transfer Assistance: Supervision for cues & safety.         Balance Balance Balance Assessed: Yes Static Sitting Balance Static Sitting - Balance Support: No upper extremity supported;Feet supported Static Sitting - Level of Assistance: 6: Modified independent (Device/Increase time) Static Standing Balance Static Standing - Balance Support: During functional activity;Left upper extremity supported (While brushing teeth standing at sink) Static Standing - Level of Assistance: 5: Stand by assistance   End of Session OT - End of Session Equipment Utilized During Treatment: Back brace Activity Tolerance: Patient tolerated treatment well Patient left: Other (comment);with call bell/phone within reach (Sitting EOB)  GO     Charletta Cousin, Amy Beth Dixon 01/13/2013, 1:32 PM

## 2013-01-13 NOTE — Progress Notes (Signed)
Pt c/o shortness of breath, trouble swallowing(a few mins previously had said his IV site was itching and slightly red- stated he was having allergic rx, pt very anxious and working himself up into panic attack).  Sats 100% on 2 liters. Started having exp wheezes. Pt with hx of anxiety/ panic attacks RPT RN and RT (responded immediately. HHN given. RRT RN and RT helping to calm pt down and pt's lung sounds clear after HHN.  On call MD advised and order for ativan given and for valium to be given IV if needed since pt did not want to take POs. Ativan given. Pt much better within 15-20 mins and resting comfortably.  Pt c/o of severe pain also and O/C MD order given to cont. PCA pain meds.

## 2013-01-13 NOTE — Progress Notes (Signed)
Physical Therapy Treatment Patient Details Name: CAROLL CUNNINGTON MRN: [id number] DOB: 07-04-1960 Today's Date: 01/13/2013 Time: 4098-1191 PT Time Calculation (min): 20 min  PT Assessment / Plan / Recommendation  History of Present Illness Pt is a 52 y/o male admitted s/p posterior lumbar fusion L5-S1. PMH includes MS.   PT Comments   Pt is slowly progressing with therapy. Continues to have difficulties recalling and adhering to back precautions. Pt has had difficulty controlling pain during acute stay, attributes the pain to his MS. Will cont to f/u with pt to maximize functional mobility.   Follow Up Recommendations  Home health PT     Does the patient have the potential to tolerate intense rehabilitation     Barriers to Discharge        Equipment Recommendations       Recommendations for Other Services    Frequency Min 5X/week   Progress towards PT Goals Progress towards PT goals: Progressing toward goals  Plan Current plan remains appropriate    Precautions / Restrictions Precautions Precautions: Back Precaution Comments: Pt able to recall 2/3 back precautions; requires cues to adhere  Required Braces or Orthoses: Spinal Brace Spinal Brace: Lumbar corset;Applied in sitting position Restrictions Weight Bearing Restrictions: No   Pertinent Vitals/Pain 6/10 in back; 7/10 in bil LEs pt using PCA PRN     Mobility  Bed Mobility Bed Mobility: Not assessed Details for Bed Mobility Assistance: pt sitting EOB and requesting to retturn to EOB; discussed with pt log rolling technique for bed mobility to adhere to back precautions; pt verbalized understanding  Transfers Transfers: Sit to Stand;Stand to Sit Sit to Stand: 5: Supervision;From bed Stand to Sit: 5: Supervision;To bed Details for Transfer Assistance: cues for safety and to adhere to back precautions; supervision for cues and safety  Ambulation/Gait Ambulation/Gait Assistance: 5: Supervision Ambulation Distance  (Feet): 200 Feet Assistive device: Small based quad cane Ambulation/Gait Assistance Details: pt cont to twist with amb when distracted and looking around in hallway; pt more steady with quad cane; requires cues for sequencing and management of quad cane.  Gait Pattern: Step-through pattern Gait velocity: decreased Stairs: No Wheelchair Mobility Wheelchair Mobility: No         PT Diagnosis:    PT Problem List:   PT Treatment Interventions:     PT Goals (current goals can now be found in the care plan section) Acute Rehab PT Goals Patient Stated Goal: to go home  PT Goal Formulation: With patient/family Time For Goal Achievement: 01/18/13 Potential to Achieve Goals: Good  Visit Information  Last PT Received On: 01/13/13 Assistance Needed: +1 History of Present Illness: Pt is a 52 y/o male admitted s/p posterior lumbar fusion L5-S1. PMH includes MS.    Subjective Data  Subjective: "I had a bad night. I must have eaten something i was allergic to. I just hope im going home today"  Patient Stated Goal: to go home    Cognition  Cognition Arousal/Alertness: Awake/alert Behavior During Therapy: Anxious;Impulsive Overall Cognitive Status: Within Functional Limits for tasks assessed    Balance  Balance Balance Assessed: Yes Static Standing Balance Static Standing - Balance Support: Right upper extremity supported;During functional activity Static Standing - Level of Assistance: 5: Stand by assistance Static Standing - Comment/# of Minutes: tolerated static standing ~68min to speak with MD in hallway; pt with knees slightly flexed during static standing    End of Session PT - End of Session Equipment Utilized During Treatment: Back brace Activity Tolerance:  Patient tolerated treatment well Patient left: in bed;with call bell/phone within reach;with nursing/sitter in room Nurse Communication: Mobility status   GP     Donell Sievert, [place] [phone] 01/13/2013, 8:29 AM

## 2013-01-31 NOTE — Discharge Summary (Signed)
Physician Discharge Summary  Patient ID: Joel Galloway MRN: [id number] DOB/AGE: May 16, 1961 52 y.o.  Admit date: 01/10/2013 Discharge date: 01/13/2013  Admission Diagnoses:  Lumbar degenerative disc disease and HNP L5-S1  Discharge Diagnoses:  Principal Problem:   Lumbar degenerative disc disease Active Problems:   HNP (herniated nucleus pulposus), lumbar   Spinal stenosis, lumbar region, with neurogenic claudication   Past Medical History  Diagnosis Date  . Multiple sclerosis   . Asthma   . Seasonal allergies   . Anxiety   . Hypoglycemia     las t episode 12/24/12  . Arthritis   . Cancer     skin     Surgeries: Procedure(s): POSTERIOR LUMBAR FUSION L5-S1, 1 LEVEL on 01/10/2013   Consultants (if any):    Discharged Condition: Improved  Hospital Course: RONDO SPITTLER is an 52 y.o. male who was admitted 01/10/2013 with a diagnosis of Lumbar degenerative disc disease and went to the operating room on 01/10/2013 and underwent the above named procedures. Intraop dural tear required pt at flat bed rest for 24 hrs.  No residual symptoms after ambulation started.  He was given perioperative antibiotics:  Anti-infectives   Start     Dose/Rate Route Frequency Ordered Stop   01/10/13 1530  ceFAZolin (ANCEF) IVPB 1 g/50 mL premix     1 g 100 mL/hr over 30 Minutes Intravenous Every 8 hours 01/10/13 1526 01/10/13 2337   01/10/13 0600  ceFAZolin (ANCEF) IVPB 2 g/50 mL premix     2 g 100 mL/hr over 30 Minutes Intravenous On call to O.R. 01/09/13 1433 01/10/13 0807    Slow with bowel function to return secondary to chronic constipation.  Pain control accomplished with po analgesics prior to discharge. Pt voiding and eating prior to discharge. Wound healing without drainage.  Walking in hallway with walker.   NV and motor function of lower extremities intact.  He was given sequential compression devices, early ambulation for DVT prophylaxis.  He benefited maximally from the hospital  stay and there were no complications.    Recent vital signs:  Filed Vitals:   01/13/13 1200  BP:   Pulse:   Temp:   Resp: 14    Recent laboratory studies:  Lab Results  Component Value Date   HGB 11.2* 01/13/2013   HGB 11.8* 01/11/2013   HGB 15.8 01/03/2013   Lab Results  Component Value Date   WBC 7.9 01/13/2013   PLT 285 01/13/2013   No results found for this basename: INR   Lab Results  Component Value Date   NA 136 01/13/2013   K 4.1 01/13/2013   CL 100 01/13/2013   CO2 25 01/13/2013   BUN 6 01/13/2013   CREATININE 0.63 01/13/2013   GLUCOSE 107* 01/13/2013    Discharge Medications:     Medication List    STOP taking these medications       oxymorphone 10 MG tablet  Commonly known as:  OPANA      TAKE these medications       albuterol 108 (90 BASE) MCG/ACT inhaler  Commonly known as:  PROVENTIL HFA;VENTOLIN HFA  Inhale 2 puffs into the lungs every 6 (six) hours as needed for wheezing.     carisoprodol 350 MG tablet  Commonly known as:  SOMA  Take 1 tablet (350 mg total) by mouth 4 (four) times daily as needed for muscle spasms.     diazepam 10 MG tablet  Commonly known as:  VALIUM  Take  10 mg by mouth every 6 (six) hours as needed for anxiety.     diphenhydrAMINE 25 mg capsule  Commonly known as:  BENADRYL  Take 25 mg by mouth every 6 (six) hours as needed for itching (throat swells).     HYDROcodone-acetaminophen 10-325 MG per tablet  Commonly known as:  NORCO  Take 1 tablet by mouth every 4 (four) hours as needed for pain (for break through pain every 4 hours.).     ibuprofen 200 MG tablet  Commonly known as:  ADVIL,MOTRIN  Take 800 mg by mouth every 8 (eight) hours as needed for pain.     interferon beta-1a 30 MCG/0.5ML injection  Commonly known as:  AVONEX  Inject 30 mcg into the muscle every 7 (seven) days.     LINZESS 290 MCG Caps capsule  Generic drug:  Linaclotide  Take 290 mcg by mouth daily.     lisinopril 20 MG tablet  Commonly known  as:  PRINIVIL,ZESTRIL  Take 20 mg by mouth daily.     morphine 15 MG 12 hr tablet  Commonly known as:  MS CONTIN  Take 5 tablets (75 mg total) by mouth every 8 (eight) hours.     omeprazole 20 MG capsule  Commonly known as:  PRILOSEC  Take 20 mg by mouth daily.     OVER THE COUNTER MEDICATION  Apply 1 application topically 2 (two) times daily.     tamsulosin 0.4 MG Caps capsule  Commonly known as:  FLOMAX  Take 0.4 mg by mouth daily.        Diagnostic Studies: Dg Chest 2 View  01/03/2013   *RADIOLOGY REPORT*  Clinical Data: Preadmission radiograph.  Posterior lumbar fusion.  CHEST - 2 VIEW  Comparison: None  Findings: The heart size and mediastinal contours are within normal limits.  Both lungs are clear.  The visualized skeletal structures are unremarkable.  IMPRESSION: No acute cardiopulmonary abnormalities.   Original Report Authenticated By: Signa Kell, M.D.   Dg Lumbar Spine 2-3 Views  01/10/2013   CLINICAL DATA:  L5-S1 fusion.  EXAM: DG C-ARM 1-60 MIN; LUMBAR SPINE - 2-3 VIEW  COMPARISON:  MRI 12/13/2012  FINDINGS: Four intraoperative spot images demonstrate posterior fusion from L5-S1. Bilateral pedicle screws present. No hardware or bony complicating feature on these intraoperative images.  IMPRESSION: L5-S1 posterior fusion as above.   Electronically Signed   By: Charlett Nose   On: 01/10/2013 14:18   Dg C-arm 1-60 Min  01/10/2013   CLINICAL DATA:  L5-S1 fusion.  EXAM: DG C-ARM 1-60 MIN; LUMBAR SPINE - 2-3 VIEW  COMPARISON:  MRI 12/13/2012  FINDINGS: Four intraoperative spot images demonstrate posterior fusion from L5-S1. Bilateral pedicle screws present. No hardware or bony complicating feature on these intraoperative images.  IMPRESSION: L5-S1 posterior fusion as above.   Electronically Signed   By: Charlett Nose   On: 01/10/2013 14:18    Disposition: 06-Home-Health Care Svc      Discharge Orders   Future Orders Complete By Expires   Call MD / Call 911  As directed     Comments:     If you experience chest pain or shortness of breath, CALL 911 and be transported to the hospital emergency room.  If you develope a fever above 101 F, pus (white drainage) or increased drainage or redness at the wound, or calf pain, call your surgeon's office.   Constipation Prevention  As directed    Comments:     Drink plenty  of fluids.  Prune juice may be helpful.  You may use a stool softener, such as Colace (over the counter) 100 mg twice a day.  Use MiraLax (over the counter) for constipation as needed.   Diet - low sodium heart healthy  As directed    Discharge instructions  As directed    Comments:     Call if there is increasing drainage, fever greater than 101.5, severe head aches, and worsening nausea or light sensitivity. If shortness of breath, bloody cough or chest tightness or pain go to an emergency room. No lifting greater than 10 lbs. Avoid bending, stooping and twisting. Use brace when sitting and out of bed even to go to bathroom. Walk in house for first 2 weeks then may start to get out slowly increasing distances up to one mile by 4-6 weeks post op. After 5 days may shower and change dressing following bathing with shower.When bathing remove the brace shower and replace brace before getting out of the shower. If drainage, keep dry dressing and do not bathe the incision, use an moisture impervious dressing. Please call and return for scheduled follow up appointment 2 weeks from the time of surgery.   Driving restrictions  As directed    Comments:     No driving for 6 weeks   Increase activity slowly as tolerated  As directed    Lifting restrictions  As directed    Comments:     No lifting for 6 weeks      Follow-up Information   Follow up with NITKA,Oryon E, MD In 1 week.   Specialty:  Orthopedic Surgery   Contact information:   [address] [place] [address] [phone]       Follow up with [facility]. (home  physical therapy, they will call you)    Contact information:   [address] [place] [address] [phone]        Signed: Wende Neighbors 01/31/2013, 10:20 AM

## 2013-02-02 NOTE — Discharge Summary (Signed)
Patient d/c summary note and lab reviewed.  

## 2013-02-08 NOTE — Progress Notes (Signed)
SW received a consult for possible placement. PT  At this time is recommending home with HH and not SNF. Clinical Social Worker will sign off for now as social work intervention is no longer needed. Please consult us again if new need arises.   Kincaid Tiger, MSW, LCSWA  [phone] 

## 2013-04-05 ENCOUNTER — Other Ambulatory Visit: Payer: Self-pay | Admitting: Specialist

## 2013-04-05 DIAGNOSIS — M79651 Pain in right thigh: Secondary | ICD-10-CM

## 2013-04-05 DIAGNOSIS — M545 Low back pain, unspecified: Secondary | ICD-10-CM

## 2013-04-15 ENCOUNTER — Ambulatory Visit
Admission: RE | Admit: 2013-04-15 | Discharge: 2013-04-15 | Disposition: A | Discharge status: Home / Self Care | Payer: 59 | Source: Ambulatory Visit | Attending: Specialist | Admitting: Specialist

## 2013-04-15 DIAGNOSIS — M545 Low back pain, unspecified: Secondary | ICD-10-CM

## 2013-04-15 DIAGNOSIS — M79651 Pain in right thigh: Secondary | ICD-10-CM

## 2013-04-15 MED ORDER — GADOBENATE DIMEGLUMINE 529 MG/ML IV SOLN
15.0000 mL | Freq: Once | INTRAVENOUS | Status: AC | PRN
  Administered 2013-04-15: 15 mL via INTRAVENOUS

## 2013-05-15 ENCOUNTER — Emergency Department (HOSPITAL_BASED_OUTPATIENT_CLINIC_OR_DEPARTMENT_OTHER)
Admission: EM | Admit: 2013-05-15 | Discharge: 2013-05-15 | Disposition: A | Discharge status: Home / Self Care | Payer: Medicare Other | Attending: Emergency Medicine | Admitting: Emergency Medicine

## 2013-05-15 ENCOUNTER — Emergency Department (HOSPITAL_BASED_OUTPATIENT_CLINIC_OR_DEPARTMENT_OTHER): Payer: Medicare Other

## 2013-05-15 ENCOUNTER — Encounter (HOSPITAL_BASED_OUTPATIENT_CLINIC_OR_DEPARTMENT_OTHER): Payer: Self-pay | Admitting: Emergency Medicine

## 2013-05-15 DIAGNOSIS — Y929 Unspecified place or not applicable: Secondary | ICD-10-CM | POA: Insufficient documentation

## 2013-05-15 DIAGNOSIS — G40909 Epilepsy, unspecified, not intractable, without status epilepticus: Secondary | ICD-10-CM | POA: Insufficient documentation

## 2013-05-15 DIAGNOSIS — J45901 Unspecified asthma with (acute) exacerbation: Secondary | ICD-10-CM | POA: Insufficient documentation

## 2013-05-15 DIAGNOSIS — G35 Multiple sclerosis: Secondary | ICD-10-CM | POA: Insufficient documentation

## 2013-05-15 DIAGNOSIS — R569 Unspecified convulsions: Secondary | ICD-10-CM

## 2013-05-15 DIAGNOSIS — Z79899 Other long term (current) drug therapy: Secondary | ICD-10-CM | POA: Insufficient documentation

## 2013-05-15 DIAGNOSIS — M129 Arthropathy, unspecified: Secondary | ICD-10-CM | POA: Insufficient documentation

## 2013-05-15 DIAGNOSIS — M950 Acquired deformity of nose: Secondary | ICD-10-CM | POA: Insufficient documentation

## 2013-05-15 DIAGNOSIS — R404 Transient alteration of awareness: Secondary | ICD-10-CM | POA: Insufficient documentation

## 2013-05-15 DIAGNOSIS — R040 Epistaxis: Secondary | ICD-10-CM | POA: Insufficient documentation

## 2013-05-15 DIAGNOSIS — R209 Unspecified disturbances of skin sensation: Secondary | ICD-10-CM | POA: Insufficient documentation

## 2013-05-15 DIAGNOSIS — Z85828 Personal history of other malignant neoplasm of skin: Secondary | ICD-10-CM | POA: Insufficient documentation

## 2013-05-15 DIAGNOSIS — Y939 Activity, unspecified: Secondary | ICD-10-CM | POA: Insufficient documentation

## 2013-05-15 DIAGNOSIS — S0990XA Unspecified injury of head, initial encounter: Secondary | ICD-10-CM | POA: Insufficient documentation

## 2013-05-15 DIAGNOSIS — S0003XA Contusion of scalp, initial encounter: Secondary | ICD-10-CM | POA: Insufficient documentation

## 2013-05-15 DIAGNOSIS — IMO0002 Reserved for concepts with insufficient information to code with codable children: Secondary | ICD-10-CM | POA: Insufficient documentation

## 2013-05-15 DIAGNOSIS — R197 Diarrhea, unspecified: Secondary | ICD-10-CM | POA: Insufficient documentation

## 2013-05-15 DIAGNOSIS — F411 Generalized anxiety disorder: Secondary | ICD-10-CM | POA: Insufficient documentation

## 2013-05-15 LAB — COMPREHENSIVE METABOLIC PANEL
ALT: 26 U/L (ref 0–53)
AST: 19 U/L (ref 0–37)
Albumin: 3.6 g/dL (ref 3.5–5.2)
Alkaline Phosphatase: 43 U/L (ref 39–117)
Chloride: 103 mEq/L (ref 96–112)
Creatinine, Ser: 0.8 mg/dL (ref 0.50–1.35)
GFR calc Af Amer: >90 mL/min (ref >90)
Potassium: 4.1 mEq/L (ref 3.5–5.1)
Sodium: 139 mEq/L (ref 135–145)
Total Bilirubin: 0.1 mg/dL — ABNORMAL LOW (ref 0.3–1.2)

## 2013-05-15 LAB — URINALYSIS, ROUTINE W REFLEX MICROSCOPIC
Bilirubin Urine: NEGATIVE
Glucose, UA: NEGATIVE mg/dL
Hgb urine dipstick: NEGATIVE
Ketones, ur: NEGATIVE mg/dL
Nitrite: NEGATIVE
Protein, ur: NEGATIVE mg/dL

## 2013-05-15 LAB — CBC WITH DIFFERENTIAL/PLATELET
Basophils Absolute: 0 10*3/uL (ref 0.0–0.1)
Basophils Relative: 1 % (ref 0–1)
HCT: 38.7 % — ABNORMAL LOW (ref 39.0–52.0)
MCH: 29.7 pg (ref 26.0–34.0)
MCHC: 33.6 g/dL (ref 30.0–36.0)
Monocytes Absolute: 0.6 10*3/uL (ref 0.1–1.0)
Neutro Abs: 4.2 10*3/uL (ref 1.7–7.7)
Neutrophils Relative %: 63 % (ref 43–77)
Platelets: 248 10*3/uL (ref 150–400)
RDW: 14.6 % (ref 11.5–15.5)
WBC: 6.6 10*3/uL (ref 4.0–10.5)

## 2013-05-15 MED ORDER — SODIUM CHLORIDE 0.9 % IV BOLUS (SEPSIS)
1000.0000 mL | Freq: Once | INTRAVENOUS | Status: AC
  Administered 2013-05-15: 1000 mL via INTRAVENOUS

## 2013-05-15 MED ORDER — MORPHINE SULFATE 4 MG/ML IJ SOLN
4.0000 mg | Freq: Once | INTRAMUSCULAR | Status: AC
  Administered 2013-05-15: 4 mg via INTRAVENOUS
  Filled 2013-05-15: qty 1

## 2013-05-15 MED ORDER — ONDANSETRON 4 MG PO TBDP
4.0000 mg | ORAL_TABLET | Freq: Once | ORAL | Status: AC
  Administered 2013-05-15: 4 mg via ORAL
  Filled 2013-05-15: qty 1

## 2013-05-15 NOTE — ED Notes (Signed)
Pt reports that he had an unwitnessed seizure this morning.  Reports that he remembers the event.  States that he started to shake and then fell to the floor.  Denies incontinence, denies biting tongue.  Pt is noted to have multiple abrasions on his face.

## 2013-05-15 NOTE — ED Notes (Signed)
Pt told registration that he felt worse, was nauseated.  Pt given 4mg  zofran ODT but reports that he takes 150mg  phenegran daily for chronic nausea.  Pt reassured.

## 2013-05-15 NOTE — ED Notes (Addendum)
Patient states that he had  "seizure like" activity last night, and that he can remember the first "30 seconds". He states that he hit his nose and it has been bleeding "for 10 hours", his nose is swollen.  Was placed on a medication recently for muscle tightness, benztropine, and he believes that medication is the reason he had a seizure. Patient states that he "feels bad, headache, weakness, shaky, dizzy". He states that he is "starving, but also feels like he wants to throw up." Patient was taking vital signs at home and states that none of his VS are normal for him. Patient states that he refuses to allow himself to be diagnosed with MS unless he has extensive testing done first. Finished a round of prednisone this past week because he felt an "MS attack coming on".

## 2013-05-15 NOTE — ED Provider Notes (Signed)
CSN: [id number]     Arrival date & time 05/15/13  1352 History  This chart was scribed for Shanna Cisco, MD by Quintella Reichert, ED scribe.  This patient was seen in room MH11/MH11 and the patient's care was started at 6:30 PM.   Chief Complaint  Patient presents with  . Seizures    Patient is a 52 y.o. male presenting with seizures. The history is provided by the patient. No language interpreter was used.  Seizures Seizure activity on arrival: no   Seizure type:  Unable to specify Initial focality: upper body. Episode characteristics comment:  Convulsion, LOC Postictal symptoms comment:  Weakness, dizziness, fatigue, lightheadedness Return to baseline: no   Number of seizures this episode:  1 Progression:  Resolved Recent head injury:  During the event PTA treatment:  None History of seizures: no     HPI Comments: Joel Galloway is a 52 y.o. male with h/o MS and asthma who presents to the Emergency Department complaining of an unwitnessed seizure that occurred this morning at 3 AM.  Pt states that he was standing in front of his stove when he suddenly felt "uncontrolled convulsions."  He felt that his upper body was shaking.  He was able to brace himself and stay standing for around 30 seconds but then fell.  He felt his face hit the stove 3 times and his head hit the side of the counter.  He then lost consciousness and woke up on the floor "in a pile of blood" from his nose.  Presently he complains of pain to his nose, posterior head, and back.  He states he has some trouble breathing out of the right nostril.  He states his back feels as painful as it did before his back surgery in August 2014 (posterior fusion lumbar spine, Dr. Otelia Sergeant).  He also states he feels generally weak and fatigued like "I have no strength whatsoever, like I've stayed up for 4 or 5 days straight."  He states he also feels lightheaded and dizzy.  He also complains of numbness and pallor in bilateral hands which  began after his seizure.  He denies nausea, vomiting, or loss of bowel or bladder control.  Pt states he has had some diarrhea over the past 2 days described as a few episodes/day of loose watery stool without blood.  He also began feeling SOB several days ago and has been using his inhaler.  He has h/o asthma but had not used his inhaler in some time.  He states he has generalized body aches chronically but denies recent changes.  Pt denies prior h/o seizures.  He states he has had approximately 15 episodes of syncope in his life and had similar associated symptoms afterwards.  He denies recent sick contacts.  He states that at his last appointment with his PCP on 12/11 his "whole body was shaking" so he was placed on an benztropine.  He took only 2 tablets.  Pt normally medicates regularly with oxymorphone 60mg  3x/day, oxycontin 75mg  3x/day, Norco 10mg  4x/day, Soma 4x/day, Flomax each morning, and Prilosec 20mg  each night.  He was taking Lantus for constipation due to his MS which he states was providing relief but 3 days ago he developed diarrhea.  He stopped taking it 2 days ago but has continued to have diarrhea.   Past Medical History  Diagnosis Date  . Multiple sclerosis   . Asthma   . Seasonal allergies   . Anxiety   . Hypoglycemia  las t episode 12/24/12  . Arthritis   . Cancer     skin    Past Surgical History  Procedure Laterality Date  . Knee surgery Left     x2  arthroscopy  . Nose surgery    . Wisdom tooth extraction    . Posterior fusion lumbar spine  01/10/2013    Dr Otelia Sergeant   History reviewed. No pertinent family history.  History  Substance Use Topics  . Smoking status: Never Smoker   . Smokeless tobacco: Never Used  . Alcohol Use: No     Review of Systems  Constitutional: Negative for fever, chills, diaphoresis, activity change, appetite change and fatigue.  HENT: Positive for nosebleeds. Negative for congestion, drooling, facial swelling, rhinorrhea, sore throat  and voice change.   Respiratory: Positive for shortness of breath. Negative for stridor.   Gastrointestinal: Positive for diarrhea. Negative for nausea, vomiting, abdominal pain, blood in stool and abdominal distention.  Endocrine: Negative for polydipsia and polyuria.  Genitourinary: Negative for dysuria, urgency, frequency and decreased urine volume.  Musculoskeletal: Positive for back pain and myalgias (chronic). Negative for gait problem and neck pain.  Skin: Negative for color change and wound.  Neurological: Positive for dizziness, seizures, syncope, weakness, light-headedness and numbness. Negative for facial asymmetry.  Hematological: Does not bruise/bleed easily.  Psychiatric/Behavioral: Negative for confusion and agitation.     Allergies  Lyrica; Other; Benztropine; Gabapentin; and Neosporin  Home Medications   Current Outpatient Rx  Name  Route  Sig  Dispense  Refill  . Linaclotide (LINZESS) 290 MCG CAPS   Oral   Take 290 mcg by mouth daily.         Marland Kitchen albuterol (PROVENTIL HFA;VENTOLIN HFA) 108 (90 BASE) MCG/ACT inhaler   Inhalation   Inhale 2 puffs into the lungs every 6 (six) hours as needed for wheezing.         . carisoprodol (SOMA) 350 MG tablet   Oral   Take 1 tablet (350 mg total) by mouth 4 (four) times daily as needed for muscle spasms.   120 tablet   0   . diazepam (VALIUM) 10 MG tablet   Oral   Take 10 mg by mouth every 6 (six) hours as needed for anxiety.         . diphenhydrAMINE (BENADRYL) 25 mg capsule   Oral   Take 25 mg by mouth every 6 (six) hours as needed for itching (throat swells).         Marland Kitchen HYDROcodone-acetaminophen (NORCO) 10-325 MG per tablet   Oral   Take 1 tablet by mouth every 4 (four) hours as needed for pain (for break through pain every 4 hours.).   60 tablet   1   . ibuprofen (ADVIL,MOTRIN) 200 MG tablet   Oral   Take 800 mg by mouth every 8 (eight) hours as needed for pain.         Marland Kitchen interferon beta-1a (AVONEX)  30 MCG/0.5ML injection   Intramuscular   Inject 30 mcg into the muscle every 7 (seven) days.         Marland Kitchen lisinopril (PRINIVIL,ZESTRIL) 20 MG tablet   Oral   Take 20 mg by mouth daily.         Marland Kitchen morphine (MS CONTIN) 15 MG 12 hr tablet   Oral   Take 5 tablets (75 mg total) by mouth every 8 (eight) hours.   150 tablet   0   . omeprazole (PRILOSEC) 20 MG capsule  Oral   Take 20 mg by mouth daily.         Marland Kitchen OVER THE COUNTER MEDICATION   Topical   Apply 1 application topically 2 (two) times daily.         . tamsulosin (FLOMAX) 0.4 MG CAPS capsule   Oral   Take 0.4 mg by mouth daily.          BP 120/76  Pulse 86  Temp(Src) 98.3 F (36.8 C) (Oral)  Resp 20  Ht 6' (1.829 m)  Wt 170 lb (77.111 kg)  BMI 23.05 kg/m2  SpO2 99%  Physical Exam  Nursing note and vitals reviewed. Constitutional: He is oriented to person, place, and time. He appears well-developed and well-nourished. No distress.  HENT:  Head: Normocephalic. Head is with contusion.    Nose: Sinus tenderness and nasal deformity present. No nasal septal hematoma.  Mouth/Throat: No oropharyngeal exudate.  Dried blood at BL nares  Eyes: Pupils are equal, round, and reactive to light.  Neck: Normal range of motion. Neck supple.  Cardiovascular: Normal rate, regular rhythm and normal heart sounds.  Exam reveals no gallop and no friction rub.   No murmur heard. Pulmonary/Chest: Effort normal and breath sounds normal. No respiratory distress. He has no wheezes. He has no rales.  Abdominal: Soft. Bowel sounds are normal. He exhibits no distension and no mass. There is no tenderness. There is no rebound and no guarding.  Musculoskeletal: Normal range of motion. He exhibits no edema.  Neurological: He is alert and oriented to person, place, and time. He has normal strength. He displays no tremor. No sensory deficit. He exhibits normal muscle tone. Coordination and gait normal. GCS eye subscore is 4. GCS verbal  subscore is 5. GCS motor subscore is 6.  Skin: Skin is warm and dry.  Psychiatric: He has a normal mood and affect.    ED Course  Procedures (including critical care time)  DIAGNOSTIC STUDIES: Oxygen Saturation is 99% on room air, normal by my interpretation.    COORDINATION OF CARE: 6:53 PM-Discussed treatment plan which includes head CT, CXR, and labs with pt at bedside and pt agreed to plan.   Labs Review Labs Reviewed  CBC WITH DIFFERENTIAL - Abnormal; Notable for the following:    HCT 38.7 (*)    All other components within normal limits  COMPREHENSIVE METABOLIC PANEL - Abnormal; Notable for the following:    Glucose, Bld 100 (*)    Total Bilirubin 0.1 (*)    All other components within normal limits  URINE CULTURE  URINALYSIS, ROUTINE W REFLEX MICROSCOPIC    Imaging Review Dg Chest 2 View  05/15/2013   CLINICAL DATA:  Seizure  EXAM: CHEST  2 VIEW  COMPARISON:  01/03/2013  FINDINGS: The lungs are clear. Negative for heart failure or pneumonia. No pleural effusion.  IMPRESSION: No active cardiopulmonary disease.   Electronically Signed   By: Marlan Palau M.D.   On: 05/15/2013 20:00   Ct Head Wo Contrast  05/15/2013   CLINICAL DATA:  Seizure  EXAM: CT HEAD WITHOUT CONTRAST  TECHNIQUE: Contiguous axial images were obtained from the base of the skull through the vertex without intravenous contrast.  COMPARISON:  None.  FINDINGS: Ventricle size is normal. Negative for acute or chronic infarction. Negative for hemorrhage or fluid collection. Negative for mass or edema. No shift of the midline structures.  Calvarium is intact.  IMPRESSION: Normal   Electronically Signed   By: Delorise Jackson.D.  On: 05/15/2013 19:58    EKG Interpretation    Date/Time:  Sunday May 15 2013 19:14:21 EST Ventricular Rate:  72 PR Interval:  128 QRS Duration: 104 QT Interval:  350 QTC Calculation: 383 R Axis:   87 Text Interpretation:  Normal sinus rhythm Incomplete right bundle branch  block Borderline ECG No significant change was found Confirmed by DOCHERTY  MD, MEGAN ([id number]) on 05/15/2013 8:22:37 PM            MDM   1. Seizure    Pt is a 52 y.o. male with Pmhx as above who presents with report of seizure. Pt states was standing, had about 30 sec of upper body shaking, but retained consciousness, hit head 3 times in a row on stove before falling to ground loosing consciousness.  No bowel or bladder unconsciousness.  On PE, pt in NAD, no objective neuro findings, pt able to ambulate to nurses station several time w/o difficulty.  No signs of decreased circulation of extremities as reported.  No shaking of extremities witnessed as reported.   He does have contusion to  Nasal bridge w/ dried blood at nares but no septal hematoma. CT head, CXR, EKG, UA, CBC, CMP unremarkable. Doubt CVA/TIA, intracranial bleed, ACS, cardiogenic syncope.  EPisode is not classic for seizure, but will have him f/u with neurology as an outpt for likely MRI, EEG.  Pt instructed he should be seeing a neurologist for mgmt of his reported MS.    I personally performed the services described in this documentation, which was scribed in my presence. The recorded information has been reviewed and is accurate.     Shanna Cisco, MD 05/16/13 1210

## 2013-05-17 ENCOUNTER — Encounter: Payer: Self-pay | Admitting: Neurology

## 2013-05-17 ENCOUNTER — Ambulatory Visit (INDEPENDENT_AMBULATORY_CARE_PROVIDER_SITE_OTHER): Payer: 59 | Admitting: Neurology

## 2013-05-17 VITALS — BP 110/60 | HR 78 | Temp 97.7°F | Ht 72.0 in | Wt 175.0 lb

## 2013-05-17 DIAGNOSIS — G35 Multiple sclerosis: Secondary | ICD-10-CM

## 2013-05-17 DIAGNOSIS — R569 Unspecified convulsions: Secondary | ICD-10-CM

## 2013-05-17 LAB — URINE CULTURE: Colony Count: NO GROWTH

## 2013-05-17 NOTE — Progress Notes (Addendum)
NEUROLOGY CONSULTATION NOTE  Joel Galloway MRN: [id number] DOB: [birth year]  Referring provider: Toy Cookey, MD  Primary care provider: Dorthula Rue, MD  Reason for consult:  seizure  HISTORY OF PRESENT ILLNESS: Joel Galloway is a 52 year old right-handed man with history of chronic pain, lumbosacral radiculopathy status post surgery, and MS who presents for seizure.  Records and images were personally reviewed where available.   On 05/15/13, he presented to the ED for an unwitnessed seizure.  He woke up in the middle of the night to let the dog out.  He couldn't get back to sleep, so he decided to cook eggs.  He said that he started to exhibit shaking of his arms and head.  He reportedly was able to hold on for a few seconds while he was shaking.  He was conscious during this period.  Then he fell and hit his face on the stove and hit the side of his head on the kitchen counter.  The next thing he remembers was laying on the floor with blood on the ground coming from his nose.  Afterwards, he felt a diffuse weakness and generalized fatigue.  There was no preceding olfactory aura or epigastric rising.  There was no tongue biting or bowel or bladder incontinence.  The event was not witnessed.  He went to the ED where CT of head was normal.  No evidence of infection or metabolic abnormality.  Exam was normal. He has no prior history of seizures.  He says he began not feeling right since undergoing posterior lumbar fusion at L5-S1 back in August.  He would feel much more sluggish and has memory problems.  On 04/28/13, he saw his PCP, Dr. Abigail Butts, because he was exhibiting these episodes where he would exhibit a "creep crawly" feeling.  He was told that he had akathisia and was prescribed Cogentin.  After taking a couple of doses, he developed an episode of severe pain and tensing up of his body that would occur off and on for 5 hours.  He discontinued the Cogentin at that point.  Last week,  he began feeling more strange.  He would start losing awareness.  For example, his children came to visit a couple of days before Christmas, and he doesn't remember it.  His son would notice him performing repetitive movements such as opening and closing the door, while unresponsive.  A few days before and since the seizures, he has been experiencing diarrhea and nausea.  He denies fevers or abdominal pain.  A couple of days before this event, he thinks he may have had a seizure in bed because he woke up having urinary incontinence.  Since the seizures, he feels more shaky particularly in his left leg.  He also feels like his speech sounds more slurred now.    He has a diagnosis of multiple sclerosis.  He reportedly developed right optic neuritis around 2001, in which he lost his vision for about 5 months.  In 2004, he developed an episode of right arm and leg weakness and later an episode of left leg weakness.  He also had complained of numbness of hands and feet, as well as clumsiness of hands, and muscle jerking.  It was around this time that he was diagnosed with multiple sclerosis.  He reports having an extensive workup performed at an MS center in [place].  He was reportedly on Avonex up until 2 years ago when he couldn't afford it anymore. He  has been on several narcotics for over 10 years for chronic diffuse pain.  He takes oxymorphone, oxycontin, Norco and Soma.  PAST MEDICAL HISTORY: Past Medical History  Diagnosis Date  . Multiple sclerosis   . Asthma   . Seasonal allergies   . Anxiety   . Hypoglycemia     las t episode 12/24/12  . Arthritis   . Cancer     skin     PAST SURGICAL HISTORY: Past Surgical History  Procedure Laterality Date  . Knee surgery Left     x2  arthroscopy  . Nose surgery    . Wisdom tooth extraction    . Posterior fusion lumbar spine  01/10/2013    Dr Otelia Sergeant    MEDICATIONS: Current Outpatient Prescriptions on File Prior to Visit  Medication Sig Dispense  Refill  . albuterol (PROVENTIL HFA;VENTOLIN HFA) 108 (90 BASE) MCG/ACT inhaler Inhale 2 puffs into the lungs every 6 (six) hours as needed for wheezing.      . carisoprodol (SOMA) 350 MG tablet Take 1 tablet (350 mg total) by mouth 4 (four) times daily as needed for muscle spasms.  120 tablet  0  . diazepam (VALIUM) 10 MG tablet Take 10 mg by mouth every 6 (six) hours as needed for anxiety.      Marland Kitchen HYDROcodone-acetaminophen (NORCO) 10-325 MG per tablet Take 1 tablet by mouth every 4 (four) hours as needed for pain (for break through pain every 4 hours.).  60 tablet  1  . ibuprofen (ADVIL,MOTRIN) 200 MG tablet Take 800 mg by mouth every 8 (eight) hours as needed for pain.      Marland Kitchen interferon beta-1a (AVONEX) 30 MCG/0.5ML injection Inject 30 mcg into the muscle every 7 (seven) days.      Marland Kitchen lisinopril (PRINIVIL,ZESTRIL) 20 MG tablet Take 20 mg by mouth daily.      Marland Kitchen morphine (MS CONTIN) 15 MG 12 hr tablet Take 5 tablets (75 mg total) by mouth every 8 (eight) hours.  150 tablet  0  . omeprazole (PRILOSEC) 20 MG capsule Take 20 mg by mouth daily.      Marland Kitchen OVER THE COUNTER MEDICATION Apply 1 application topically 2 (two) times daily.      . tamsulosin (FLOMAX) 0.4 MG CAPS capsule Take 0.4 mg by mouth daily.      . diphenhydrAMINE (BENADRYL) 25 mg capsule Take 25 mg by mouth every 6 (six) hours as needed for itching (throat swells).      . Linaclotide (LINZESS) 290 MCG CAPS Take 290 mcg by mouth daily.       No current facility-administered medications on file prior to visit.    ALLERGIES: Allergies  Allergen Reactions  . Lyrica [Pregabalin] Other (See Comments)  . Other Anaphylaxis    nuts  . Benztropine   . Gabapentin Other (See Comments)  . Neosporin [Neomycin-Bacitracin Zn-Polymyx] Swelling    FAMILY HISTORY: No family history on file.  SOCIAL HISTORY: History   Social History  . Marital Status: Married    Spouse Name: N/A    Number of Children: N/A  . Years of Education: N/A    Occupational History  . Not on file.   Social History Main Topics  . Smoking status: Never Smoker   . Smokeless tobacco: Never Used  . Alcohol Use: No  . Drug Use: No  . Sexual Activity: Not on file   Other Topics Concern  . Not on file   Social History Narrative  . No narrative  on file    REVIEW OF SYSTEMS: Constitutional: No fevers, chills, or sweats, no generalized fatigue, change in appetite Eyes: No visual changes, double vision, eye pain Ear, nose and throat: No hearing loss, ear pain, nasal congestion, sore throat Cardiovascular: No chest pain, palpitations Respiratory:  No shortness of breath at rest or with exertion, wheezes GastrointestinaI: Nausea, diarrhea Genitourinary:  No dysuria, urinary retention or frequency Musculoskeletal:  Diffuse pain Integumentary: No rash, pruritus, skin lesions Neurological: as above Psychiatric: anxiety, difficulty sleeping. Endocrine: Fatigue Hematologic/Lymphatic:  No anemia, purpura, petechiae. Allergic/Immunologic: no itchy/runny eyes, nasal congestion, recent allergic reactions, rashes  PHYSICAL EXAM: Filed Vitals:   05/17/13 1302  BP: 110/60  Pulse: 78  Temp: 97.7 F (36.5 C)   General: No acute distress Head:  Normocephalic/atraumatic Neck: supple, no paraspinal tenderness, full range of motion Back: No paraspinal tenderness Heart: regular rate and rhythm Lungs: Clear to auscultation bilaterally. Vascular: No carotid bruits. Neurological Exam: Mental status: alert and oriented to person, place, and time, speech fluent and not dysarthric, language intact. Cranial nerves: CN I: not tested CN II: pupils equal, round and reactive to light, visual fields intact, fundi unremarkable. CN III, IV, VI:  full range of motion, no nystagmus, no ptosis CN V: facial sensation intact CN VII: upper and lower face symmetric CN VIII: hearing intact CN IX, X: gag intact, uvula midline CN XI: sternocleidomastoid and trapezius  muscles intact CN XII: tongue midline Bulk & Tone: normal, no fasciculations. Motor: 5/5 throughout Sensation: pinprick and vibration intact Deep Tendon Reflexes: 2+ throughout, toes down Finger to nose testing: normal Heel to shin: normal Gait: normal stance and stride.  Difficulty walking on toes, heels and in tandem. Romberg negative.  IMPRESSION: 1.  Episode of loss of consciousness.  Unusual presentation but seizure is in the differential.  He also exhibits depressed affect. 2.  History of multiple sclerosis  PLAN: 1.  Will need to get MRI of brain and EEG. 2.  Will need to get records from prior MS workup. 3.  Will hold off on initiating antiepileptic medication at this time until results of above tests. 4.  After review of old records, will discuss initiating a disease-modifying agent for MS. 5.  Follow up in 3 months.  60 minutes spent with patient, over 50% spent counseling and coordinating care.  Thank you for allowing me to take part in the care of this patient.  Shon Millet, DO  CC:  Dorthula Rue, MD   ADDENDUM 05/24/2012: Received prior notes. He was evaluated by Dr. Lossie Faes at the [facility] in May 2010.  Neurological exam, including pupils, fundi, strength, sensory, and gait were normal.  He had VER with revealed relative delay on the right with P100 of 139 (repeat 148), which suggested past history of optic neuritis.  SSEP and BAER were normal.  EEG from 12/20/08 was normal.  Blood work included positive ANA.  ACE 32, Sjogren's antibodies, TSH 1.480, B12 869, ESR 3, RPR non-reactive were all unremarkable.  Fasting glucose was 105.  I don't believe an LP was performed.  He had a NCV-EMG performed on 01/16/03 for right arm weakness.  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 Shon Millet, DO

## 2013-05-17 NOTE — Patient Instructions (Addendum)
1.  We will get your records of your MS workup 2.  MRI of brain 3.  EEG 4.  No driving, swimming alone, standing on ladders or operating heavy machinery. 5.  If you have another episode, go to the ED. 6.  Follow up in 3 months.  Your sleep deprived EEG is scheduled at [facility] on Monday, January 5th at 8:00 am.  Please arrive at first floor admitting fifteen minutes prior to your appointment. Enter the hospital at Entrance A off of [address]. You must stay awake from midnight forward prior to your appointment.  Avoid all caffeine products.   [phone].  Your MRI is scheduled at [facility] ([facility] located at [address] in [place] on Wednesday, Dec. 31st at 3:45 pm. Please arrive at 3:15 pm.  Their phone # is [phone].

## 2013-05-18 ENCOUNTER — Telehealth: Payer: Self-pay | Admitting: Neurology

## 2013-05-18 NOTE — Telephone Encounter (Signed)
Returned a call from the patient who left me a message at 11:53 am. He reports a severe migraine and nausea/diarrhea since last Sunday. After speaking with Dr. Everlena Cooper prior to returning this call, I urged the patient to call his PCP to address his nausea and diarrhea and to keep the appointment for the MRI brain scheduled this afternoon at [facility]. He states he will. Aware that the office will be closing today at 2:00pm and all day tomorrow; will reopen on 05/20/13 at 0800. We will let him know the results of the MRI once we have it. The patient was ok with this plan.

## 2013-05-20 ENCOUNTER — Encounter: Payer: Self-pay | Admitting: Neurology

## 2013-05-20 ENCOUNTER — Telehealth: Payer: Self-pay | Admitting: Neurology

## 2013-05-20 NOTE — Telephone Encounter (Signed)
Spoke with patient and relayed below message from Dr Tomi Likens. He states he has his records at home and is working on locating them to bring them to the office for review. He is aware that we can not give him much information until we have past records for comparison. He is already on a 81 mg aspirin daily. I encouraged him to continue this. His PCP is out of town this week, but he did see a Librarian, academic, Leigh Aurora, today regarding his ongoing headache. He states this is no better. His diarrhea has stopped but he is still having nausea. He states he was on Imitrex for migraines 2-3 years ago. He asked Leigh Aurora, NP for an RX today but per the patient she asked him to call our office because she was not sure if it was safe to prescribe this medication if patient is having seizures. He is asking for an RX from Dr Tomi Likens. Dr Tomi Likens please advise.

## 2013-05-20 NOTE — Telephone Encounter (Signed)
Spoke with Dr Tomi Likens who advised it is not safe to prescribe Imitrex. I relayed this to the patient. He inquired if any of his other pain medications could be "doubled up" to help with pain management for his headache. I STRONGLY advised him to take all pain mediations exactly as directed due to being on multiple medications for pain. He will work on getting [place] his past records and we will follow up once these are received and reviewed.

## 2013-05-20 NOTE — Telephone Encounter (Signed)
Message copied by Annamaria Helling on Fri May 20, 2013  3:05 PM ------      Message from: JAFFE, ADAM R      Created: Fri May 20, 2013  2:35 PM       MRI of brain shows tiny spots.  Nothing suggests it is active but without past imaging available, not sure if this has progressed since prior imaging.  We really need him to get [place] his past notes and workup for MS to figure out what we need to do.  Also, there is evidence of an old tiny stroke, so he should probably be on ASA 81mg  daily.       ------

## 2013-05-20 NOTE — Telephone Encounter (Signed)
Patient left me a message to return his call at 1:44 pm--asked that he return my call.

## 2013-05-20 NOTE — Telephone Encounter (Signed)
I wouldn't use Imitrex mostly because of history of cerebrovascular disease.  It is difficult for me to prescribe something for headache when I really don't know the details of his headache.  I don't even know if these are migraines that he is talking about.  When I saw him earlier this week, we discussed seizure and history of MS.  I would really need to see him again to specifically discuss his headaches in order for me to prescribe something.  Are these his typical migraines?  What medications has he tried? Also, he already is taking narcotics daily, which he has been on for at least 15 years, which makes it more difficult to treat a migraine.  Since I wouldn't use Imitrex, the only options I suggest would be any NSAID (ibuprofen, naproxen) or Excedrin.

## 2013-05-23 ENCOUNTER — Ambulatory Visit (HOSPITAL_COMMUNITY)
Admission: RE | Admit: 2013-05-23 | Discharge: 2013-05-23 | Disposition: A | Discharge status: Home / Self Care | Payer: 59 | Source: Ambulatory Visit | Attending: Neurology | Admitting: Neurology

## 2013-05-23 DIAGNOSIS — R569 Unspecified convulsions: Secondary | ICD-10-CM

## 2013-05-23 DIAGNOSIS — G8929 Other chronic pain: Secondary | ICD-10-CM | POA: Insufficient documentation

## 2013-05-23 DIAGNOSIS — I679 Cerebrovascular disease, unspecified: Secondary | ICD-10-CM | POA: Insufficient documentation

## 2013-05-23 DIAGNOSIS — G35 Multiple sclerosis: Secondary | ICD-10-CM | POA: Insufficient documentation

## 2013-05-23 DIAGNOSIS — R9401 Abnormal electroencephalogram [EEG]: Secondary | ICD-10-CM | POA: Insufficient documentation

## 2013-05-23 NOTE — Procedures (Signed)
ELECTROENCEPHALOGRAM REPORT  Date of Study: 05/23/2012  Patient's Name: Joel Galloway MRN: [id number] Date of Birth: 1960/10/15  Indication: 53 year old man with history of cerebrovascular disease, chronic pain on narcotics and multiple sclerosis, who presents for new-onset seizure-like activity.  Medications: Morphine, hydrocodone, oxymorphone, diphenhydramine  Technical Summary: This is a multichannel digital EEG recording, using the international 10-20 placement system.  Spike detection software was employed.  Description: The EEG background is symmetric, with a well-developed posterior dominant rhythm of 8 Hz, which is reactive to eye opening and closing.  Diffuse beta activity is seen, with a bilateral frontal preponderance.  Mild generalized slowing is seen, but no focal abnormalities are seen.  No focal or generalized epileptiform discharges are seen.  Stage II sleep is not seen.  Hyperventilation was not performed.  Photic stimulation was performed, and produced no abnormalities.  ECG revealed normal cardiac rate and rhythm.  Impression: This is an abnormal routine EEG of the awake and drowsy states, with activating procedures, due to generalized slowing, likely secondary to pharmacologic effect.  No evidence of increased seizure tendency seen in this study.  Joel Galloway R. Tomi Likens, DO

## 2013-05-23 NOTE — Telephone Encounter (Signed)
Patient walked in to the office to deliver medical records and his recent MRI CD. He was asking for something for his HA that he states he has had for the past 9 days. I verbally let him know the response from Dr. Tomi Likens from last Friday and encouraged him to make an appointment with Dr. Tomi Likens to discuss. He did that.

## 2013-05-23 NOTE — Progress Notes (Signed)
EEG Completed; Results Pending  

## 2013-05-23 NOTE — Telephone Encounter (Signed)
Please see phone note dated 05/20/13.

## 2013-05-24 ENCOUNTER — Encounter: Payer: Self-pay | Admitting: Neurology

## 2013-05-24 ENCOUNTER — Ambulatory Visit (INDEPENDENT_AMBULATORY_CARE_PROVIDER_SITE_OTHER): Payer: 59 | Admitting: Neurology

## 2013-05-24 VITALS — BP 98/68 | HR 88 | Temp 97.4°F | Ht 72.0 in | Wt 177.0 lb

## 2013-05-24 DIAGNOSIS — R569 Unspecified convulsions: Secondary | ICD-10-CM

## 2013-05-24 DIAGNOSIS — R51 Headache: Secondary | ICD-10-CM

## 2013-05-24 DIAGNOSIS — G43709 Chronic migraine without aura, not intractable, without status migrainosus: Secondary | ICD-10-CM

## 2013-05-24 DIAGNOSIS — G35 Multiple sclerosis: Secondary | ICD-10-CM

## 2013-05-24 DIAGNOSIS — IMO0002 Reserved for concepts with insufficient information to code with codable children: Secondary | ICD-10-CM

## 2013-05-24 NOTE — Progress Notes (Signed)
NEUROLOGY FOLLOW UP OFFICE NOTE  Joel Galloway [id number]  HISTORY OF PRESENT ILLNESS: Joel Galloway is a 53 year old right-handed man with history of chronic pain, lumbosacral radiculopathy status post surgery, and MS who follows up for chronic headaches, as well as possible recent seizure and history of MS.  Records and images were personally reviewed where available.    HEADACHE: History of migraines. Onset: He had not had a migraine for 2 years until this seizure episode.  These are his typical past migraines. Location:  Left temporal Quality:  stabbing Intensity:  6/10 Aura:  no Associated symptoms:  Nausea, photophobia, phonophobia Duration:  Constant  Frequency:  Constant Past abortive therapy:  Maxalt, Imitrex (helpful), Excedrin (ineffective), phenergan (ineffective) Past preventative therapy:  Amitriptyline/nortriptyline (tremor), Depakote (tremor).  He has not tried propranolol, Effexor or Topamax Current abortive therapy:  Oxymorphone, Norco, oxycontin, NSAIDs, Zofran for nausea Current preventative therapy:  none  CONVULSION: On 05/15/13, he presented to the ED for an unwitnessed seizure.  He woke up in the middle of the night to let the dog out.  He couldn't get back to sleep, so he decided to cook eggs.  He said that he started to exhibit shaking of his arms and head.  He reportedly was able to hold on for a few seconds while he was shaking.  He was conscious during this period.  Then he fell and remembers hitting his face on the stove and hitting the side of his head on the kitchen counter.  The next thing he remembers was laying on the floor with blood on the ground coming from his nose.  Afterwards, he felt a diffuse weakness and generalized fatigue.  There was no preceding olfactory aura or epigastric rising.  There was no tongue biting or bowel or bladder incontinence.  The event was not witnessed.  He went to the ED where CT of head was normal.  No evidence of  infection or metabolic abnormality.  Exam was normal. He has no prior history of seizures.  EEG performed 05/23/12 was normal except for mild slowing secondary to pharmacologic effect.  MRI of brain with and without contrast performed on 05/18/13 revealed nonspecific white matter lesions, but nothing involving the corpus callosum and nothing enhancing.  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  He says he began not feeling right since undergoing posterior lumbar fusion at L5-S1 back in August.  He would feel much more sluggish and has memory problems.  On 04/28/13, he saw his PCP, Dr. Rodolph Bong, because he was exhibiting these episodes where he would exhibit a "creep crawly" feeling.  He was told that he had akathisia and was prescribed Cogentin.  After taking a couple of doses, he developed an episode of severe pain and tensing up of his body that would occur off and on for 5 hours.  He discontinued the Cogentin at that point.  He would start losing awareness.  For example, his children came to visit a couple of days before Christmas, and he doesn't remember it.  His son would notice him performing repetitive movements such as opening and closing the door, while unresponsive.  A few days before and since the seizures, he has been experiencing diarrhea and nausea.  He denies fevers or abdominal pain.  A couple of days before this event, he thinks he may have had a seizure in bed because he woke up having had urinary and fecal incontinence (diarrhea).  Since the seizures, he feels more shaky particularly in his left leg.  He also feels like his speech sounds more slurred now.    MS: He has a diagnosis of multiple sclerosis.  He reportedly developed right optic neuritis around 2001, in which he lost his vision for about 5 months.  In 2004, he developed an episode of right arm and leg weakness and later an episode of left leg weakness.  He also had complained of numbness of  hands and feet, as well as clumsiness of hands, and muscle jerking.  It was around this time that he was diagnosed with multiple sclerosis.  He was evaluated by Dr. America Brown at the [facility] in May 2010.  Neurological exam, including pupils, fundi, strength, sensory, and gait were normal.  He had VER with revealed relative delay on the right with P100 of 139 and repeated at 148 (left P100 was 106), which suggested past history of optic neuritis.  SSEP and BAER were normal.  EEG from 12/20/08 was normal.  Blood work included positive ANA.  ACE 32, Sjogren's antibodies, TSH 1.480, B12 869, ESR 3, RPR non-reactive were all unremarkable.  Fasting glucose was 105.  I don't believe an LP was performed.  He was reportedly on Avonex up until 2 years ago when he couldn't afford it anymore.  MRI of brain with and without contrast performed on 07/13/08 revealed tiny non-specific white matter lesions.  MRI of brain with and without contrast performed on 05/18/13 revealed nonspecific white matter lesions, but nothing involving the corpus callosum and nothing enhancing.  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  Unfortunately, I don't have the actual images and the reading radiologist did not have any prior imaging to determine if it looked stable.  CHRONIC PAIN SYNDROME: He has been on several narcotics for over 10 years for chronic diffuse pain.  He says it is related to his MS.  He takes oxymorphone, oxycontin, Norco and Soma.  He reports that he is resistant to most pain medications.  He had a NCV-EMG performed on 01/16/03 for right arm weakness.  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  PAST MEDICAL HISTORY: Past Medical History  Diagnosis Date  . Multiple sclerosis   . Asthma   . Seasonal allergies   . Anxiety   . Hypoglycemia     las t episode 12/24/12  . Arthritis   . Cancer     skin      MEDICATIONS: Current Outpatient Prescriptions on File Prior to Visit  Medication Sig Dispense Refill  . albuterol (PROVENTIL HFA;VENTOLIN HFA) 108 (90 BASE) MCG/ACT inhaler Inhale 2 puffs into the lungs every 6 (six) hours as needed for wheezing.      . carisoprodol (SOMA) 350 MG tablet Take 1 tablet (350 mg total) by mouth 4 (four) times daily as needed for muscle spasms.  120 tablet  0  . diazepam (VALIUM) 10 MG tablet Take 10 mg by mouth every 6 (six) hours as needed for anxiety.      . diphenhydrAMINE (BENADRYL) 25 mg capsule Take 25 mg by mouth every 6 (six) hours as needed for itching (throat swells).      Marland Kitchen HYDROcodone-acetaminophen (NORCO) 10-325 MG per tablet Take 1 tablet by mouth every 4 (four) hours as needed for pain (for break through pain every 4 hours.).  60 tablet  1  . ibuprofen (ADVIL,MOTRIN) 200 MG tablet  Take 800 mg by mouth every 8 (eight) hours as needed for pain.      Marland Kitchen interferon beta-1a (AVONEX) 30 MCG/0.5ML injection Inject 30 mcg into the muscle every 7 (seven) days.      Marland Kitchen lisinopril (PRINIVIL,ZESTRIL) 20 MG tablet Take 20 mg by mouth daily.      Marland Kitchen morphine (MS CONTIN) 15 MG 12 hr tablet Take 5 tablets (75 mg total) by mouth every 8 (eight) hours.  150 tablet  0  . omeprazole (PRILOSEC) 20 MG capsule Take 20 mg by mouth daily.      Marland Kitchen oxymorphone (OPANA) 10 MG tablet Take 10 mg by mouth every 4 (four) hours as needed for pain.      . tamsulosin (FLOMAX) 0.4 MG CAPS capsule Take 0.4 mg by mouth daily.      . Linaclotide (LINZESS) 290 MCG CAPS Take 290 mcg by mouth daily.      Marland Kitchen OVER THE COUNTER MEDICATION Apply 1 application topically 2 (two) times daily.       No current facility-administered medications on file prior to visit.    ALLERGIES: Allergies  Allergen Reactions  . Lyrica [Pregabalin] Other (See Comments)  . Other Anaphylaxis    nuts  . Benztropine   . Gabapentin Other (See Comments)  . Neosporin [Neomycin-Bacitracin Zn-Polymyx] Swelling     FAMILY HISTORY: Family History  Problem Relation Age of Onset  . Ataxia Neg Hx   . Chorea Neg Hx   . Dementia Neg Hx   . Mental retardation Neg Hx   . Migraines Neg Hx   . Multiple sclerosis Neg Hx   . Neurofibromatosis Neg Hx   . Neuropathy Neg Hx   . Parkinsonism Neg Hx   . Seizures Neg Hx   . Stroke Neg Hx     SOCIAL HISTORY: History   Social History  . Marital Status: Married    Spouse Name: N/A    Number of Children: N/A  . Years of Education: N/A   Occupational History  . Not on file.   Social History Main Topics  . Smoking status: Never Smoker   . Smokeless tobacco: Never Used  . Alcohol Use: No  . Drug Use: No  . Sexual Activity: Not on file   Other Topics Concern  . Not on file   Social History Narrative  . No narrative on file    REVIEW OF SYSTEMS: Constitutional: Fatigue, reduced appetite Eyes: No visual changes, double vision, eye pain Ear, nose and throat: No hearing loss, ear pain, nasal congestion, sore throat Cardiovascular: No chest pain, palpitations Respiratory:  No shortness of breath at rest or with exertion, wheezes GastrointestinaI: Nausea, vomiting, diarrhea Genitourinary:  No dysuria, urinary retention or frequency Musculoskeletal:  Diffuse pain Integumentary: No rash, pruritus, skin lesions Neurological: as above Psychiatric: feels unwell Endocrine: No palpitations, fatigue, diaphoresis, mood swings, change in appetite, change in weight, increased thirst Hematologic/Lymphatic:  No anemia, purpura, petechiae. Allergic/Immunologic: no itchy/runny eyes, nasal congestion, recent allergic reactions, rashes  PHYSICAL EXAM: Filed Vitals:   05/24/13 1436  BP: 98/68  Pulse: 88  Temp: 97.4 F (36.3 C)   General: No acute distress Head:  Normocephalic/atraumatic   IMPRESSION: 1.  Chronic migraines.  Complicated by stress, medication-overuse and possibly dehydration due to ongoing diarrhea. 2.  Convulsions.  Possible seizure.   Unusual presentation for a seizure, given that he was conscious during bilateral shaking of his body while standing, as well as the fact that he remembers actually  hitting his head and face several times while falling down before passing out.  EEG is essentially unremarkable and past EEG normal.  This event was unwitnessed and I don't know if he passed out due to hitting his head and face. 3.  HISTORY OF MULTIPLE SCLEROSIS.  At this point, I am still hesitant to call it MS.  The white matter lesions on MRI from 2010 are very few, punctate and nonspecific, something that could be seen in a person with history of chronic migraine.  He may have had an isolated optic neuritis at one time, but that does not constitute MS. 4.  CEREBROVASCULAR DISEASE 5.  CHRONIC PAIN SYNDROME  PLAN: 1.  For headache, we will try propranolol, starting at 56m BID and increasing to 446mBID.  I also recommended Topamax, especially since it is an anti-epileptic as well, but he does not want to start an antiepileptic medication due to past history of side effects. he should increase water intake. Due to history of cerebrovascular disease, I do not want to prescribe triptans.  Treatment is limited to NSAIDs, Tylenol and Excedrin.  Likely difficult to treat in conjunction with chronic pain and daily use of pain medications, including narcotics.  Ultimately, may need to see headache or pain specialist to manage. 2. Recommend LP, checking protein, glucose, cell count w/diff, cytology, flow cytometry, GS and culture, IgG index, OCB, MBP, Lyme antibody index, and crypto PCR.  He said he will get me the actual images from the MRI from last week.  He also told me he had an MRI of the cervical spine performed in the last couple of years and will try to get that to me.  Otherwise, we will need to perform that. 3.  Continue ASA 8196maily for secondary stroke prevention.  Would check carotid doppler as well. 4.  No driving.  Must be  seizure-free for six months before able to resume driving (as per [place] law). Informed him and he understands. 5.  Discuss with Dr. ArmRodolph Bonggarding ongoing diarrhea.  45 minutes spent with patient, 100% spent counseling and coordinating care.  AdaMetta ClinesO  CC:  MarLilly CoveD

## 2013-05-24 NOTE — Patient Instructions (Addendum)
1.  For the chronic headaches, we will start propranolol.  Take 20mg  twice daily for one week, then 40mg  twice daily.  Side effects include sleepiness, dizziness, lightheadedness. 2.  Also, dehydration may be contributing to the headaches due to diarrhea.  Drink water and contact Dr. Rodolph Bong regarding cause of diarrhea 3.  We will order spinal tap  4.  Get the MRI of the neck (cervical spine) for my review. 5.  No driving as per [place] law. 6.  Follow up in 2 months.  I will set up the spinal tap at the hospital and call you with all of the details.

## 2013-05-25 ENCOUNTER — Encounter: Payer: Self-pay | Admitting: Neurology

## 2013-05-25 ENCOUNTER — Other Ambulatory Visit: Payer: Self-pay | Admitting: Neurology

## 2013-05-25 DIAGNOSIS — I639 Cerebral infarction, unspecified: Secondary | ICD-10-CM

## 2013-05-26 ENCOUNTER — Other Ambulatory Visit: Payer: Self-pay | Admitting: Neurology

## 2013-05-26 MED ORDER — PROPRANOLOL HCL 20 MG PO TABS: 20.0000 mg | ORAL_TABLET | Freq: Two times a day (BID) | ORAL | Status: DC

## 2013-05-27 ENCOUNTER — Ambulatory Visit (HOSPITAL_COMMUNITY)
Admission: RE | Admit: 2013-05-27 | Discharge: 2013-05-27 | Disposition: A | Discharge status: Home / Self Care | Payer: 59 | Source: Ambulatory Visit | Attending: Neurology | Admitting: Neurology

## 2013-05-27 ENCOUNTER — Ambulatory Visit (HOSPITAL_COMMUNITY): Payer: 59

## 2013-05-27 ENCOUNTER — Other Ambulatory Visit (HOSPITAL_COMMUNITY): Payer: Self-pay | Admitting: Neurology

## 2013-05-27 DIAGNOSIS — I639 Cerebral infarction, unspecified: Secondary | ICD-10-CM

## 2013-05-27 DIAGNOSIS — R0989 Other specified symptoms and signs involving the circulatory and respiratory systems: Secondary | ICD-10-CM

## 2013-05-27 DIAGNOSIS — R569 Unspecified convulsions: Secondary | ICD-10-CM | POA: Diagnosis present

## 2013-05-30 ENCOUNTER — Encounter: Payer: Self-pay | Admitting: Neurology

## 2013-06-01 ENCOUNTER — Ambulatory Visit (HOSPITAL_COMMUNITY): Admission: RE | Admit: 2013-06-01 | Payer: 59 | Source: Ambulatory Visit

## 2013-06-06 ENCOUNTER — Telehealth: Payer: Self-pay | Admitting: Neurology

## 2013-06-06 ENCOUNTER — Encounter: Payer: Self-pay | Admitting: Neurology

## 2013-06-06 NOTE — Telephone Encounter (Signed)
Patient called and states he had to cancel lumbar puncture because he did not have a ride home. He wants to know if the lumbar puncture is being done to figure out why he is having seizures. He states he does not want another work up for MS. He wants to know prior to rescheduling. Please advise.

## 2013-06-07 NOTE — Telephone Encounter (Signed)
He would like to know if there is any more work up for seizures that he needs to complete.

## 2013-06-07 NOTE — Telephone Encounter (Signed)
No, I have no other workup for seizures.

## 2013-06-07 NOTE — Telephone Encounter (Signed)
We don't need to pursue a workup for MS but then from my standpoint, I cannot presume he has MS since I feel the original workup was incomplete.

## 2013-06-07 NOTE — Telephone Encounter (Signed)
Yes, I have them.  He can pick them up.

## 2013-07-26 ENCOUNTER — Ambulatory Visit: Payer: 59 | Admitting: Neurology

## 2013-07-27 ENCOUNTER — Telehealth: Payer: Self-pay | Admitting: Neurology

## 2013-07-27 NOTE — Telephone Encounter (Signed)
Pt no showed 07/26/13 follow up appt w/ dr. Tomi Likens. No show letter mailed to pt / Sherri S.

## 2013-08-16 ENCOUNTER — Ambulatory Visit: Payer: 59 | Admitting: Neurology

## 2014-07-13 ENCOUNTER — Ambulatory Visit (HOSPITAL_COMMUNITY): Payer: 59 | Admitting: Anesthesiology

## 2014-07-13 ENCOUNTER — Encounter (HOSPITAL_COMMUNITY)
Admission: RE | Disposition: A | Discharge status: Home / Self Care | Payer: Self-pay | Source: Ambulatory Visit | Attending: Orthopaedic Surgery

## 2014-07-13 ENCOUNTER — Encounter (HOSPITAL_COMMUNITY): Payer: Self-pay | Admitting: *Deleted

## 2014-07-13 ENCOUNTER — Ambulatory Visit (HOSPITAL_COMMUNITY)
Admission: RE | Admit: 2014-07-13 | Discharge: 2014-07-13 | Disposition: A | Discharge status: Home / Self Care | Payer: 59 | Source: Ambulatory Visit | Attending: Orthopaedic Surgery | Admitting: Orthopaedic Surgery

## 2014-07-13 DIAGNOSIS — S67192A Crushing injury of right middle finger, initial encounter: Secondary | ICD-10-CM | POA: Diagnosis present

## 2014-07-13 DIAGNOSIS — Z886 Allergy status to analgesic agent status: Secondary | ICD-10-CM | POA: Insufficient documentation

## 2014-07-13 DIAGNOSIS — G4730 Sleep apnea, unspecified: Secondary | ICD-10-CM | POA: Diagnosis not present

## 2014-07-13 DIAGNOSIS — Z888 Allergy status to other drugs, medicaments and biological substances status: Secondary | ICD-10-CM | POA: Insufficient documentation

## 2014-07-13 DIAGNOSIS — Y929 Unspecified place or not applicable: Secondary | ICD-10-CM | POA: Insufficient documentation

## 2014-07-13 DIAGNOSIS — S61312A Laceration without foreign body of right middle finger with damage to nail, initial encounter: Secondary | ICD-10-CM | POA: Diagnosis not present

## 2014-07-13 DIAGNOSIS — W230XXA Caught, crushed, jammed, or pinched between moving objects, initial encounter: Secondary | ICD-10-CM | POA: Insufficient documentation

## 2014-07-13 DIAGNOSIS — G35 Multiple sclerosis: Secondary | ICD-10-CM | POA: Diagnosis not present

## 2014-07-13 DIAGNOSIS — S6981XA Other specified injuries of right wrist, hand and finger(s), initial encounter: Secondary | ICD-10-CM

## 2014-07-13 DIAGNOSIS — IMO0002 Reserved for concepts with insufficient information to code with codable children: Secondary | ICD-10-CM

## 2014-07-13 DIAGNOSIS — J45909 Unspecified asthma, uncomplicated: Secondary | ICD-10-CM | POA: Insufficient documentation

## 2014-07-13 DIAGNOSIS — M1990 Unspecified osteoarthritis, unspecified site: Secondary | ICD-10-CM | POA: Diagnosis not present

## 2014-07-13 DIAGNOSIS — Z79899 Other long term (current) drug therapy: Secondary | ICD-10-CM | POA: Diagnosis not present

## 2014-07-13 DIAGNOSIS — F419 Anxiety disorder, unspecified: Secondary | ICD-10-CM | POA: Insufficient documentation

## 2014-07-13 DIAGNOSIS — Z91018 Allergy to other foods: Secondary | ICD-10-CM | POA: Insufficient documentation

## 2014-07-13 HISTORY — PX: HEMATOMA EVACUATION: SHX5118

## 2014-07-13 HISTORY — DX: Headache, unspecified: R51.9

## 2014-07-13 HISTORY — DX: Sleep apnea, unspecified: G47.30

## 2014-07-13 HISTORY — DX: Headache: R51

## 2014-07-13 LAB — CBC
HCT: 41.8 % (ref 39.0–52.0)
Hemoglobin: 14.6 g/dL (ref 13.0–17.0)
MCH: 30.2 pg (ref 26.0–34.0)
MCHC: 34.9 g/dL (ref 30.0–36.0)
MCV: 86.4 fL (ref 78.0–100.0)
PLATELETS: 317 10*3/uL (ref 150–400)
RBC: 4.84 MIL/uL (ref 4.22–5.81)
RDW: 12.4 % (ref 11.5–15.5)
WBC: 7.5 10*3/uL (ref 4.0–10.5)

## 2014-07-13 LAB — GLUCOSE, CAPILLARY: Glucose-Capillary: 118 mg/dL — ABNORMAL HIGH (ref 70–99)

## 2014-07-13 SURGERY — EVACUATION HEMATOMA
Anesthesia: Monitor Anesthesia Care | Site: Finger | Laterality: Right

## 2014-07-13 MED ORDER — LACTATED RINGERS IV SOLN
INTRAVENOUS | Status: DC
  Administered 2014-07-13: 15:00:00 via INTRAVENOUS

## 2014-07-13 MED ORDER — MIDAZOLAM HCL 2 MG/2ML IJ SOLN
INTRAMUSCULAR | Status: AC
  Filled 2014-07-13: qty 2

## 2014-07-13 MED ORDER — OXYCODONE-ACETAMINOPHEN 5-325 MG PO TABS: 1.0000 | ORAL_TABLET | ORAL | Status: DC | PRN

## 2014-07-13 MED ORDER — HYDROMORPHONE HCL 1 MG/ML IJ SOLN: 0.2500 mg | INTRAMUSCULAR | Status: DC | PRN

## 2014-07-13 MED ORDER — LIDOCAINE HCL (CARDIAC) 20 MG/ML IV SOLN
INTRAVENOUS | Status: DC | PRN
  Administered 2014-07-13: 60 mg via INTRAVENOUS

## 2014-07-13 MED ORDER — PROPOFOL INFUSION 10 MG/ML OPTIME
INTRAVENOUS | Status: DC | PRN
  Administered 2014-07-13: 100 ug/kg/min via INTRAVENOUS

## 2014-07-13 MED ORDER — CEFAZOLIN SODIUM-DEXTROSE 2-3 GM-% IV SOLR
INTRAVENOUS | Status: DC | PRN
  Administered 2014-07-13: 2 g via INTRAVENOUS

## 2014-07-13 MED ORDER — FENTANYL CITRATE 0.05 MG/ML IJ SOLN
INTRAMUSCULAR | Status: DC | PRN
  Administered 2014-07-13: 100 ug via INTRAVENOUS

## 2014-07-13 MED ORDER — FENTANYL CITRATE 0.05 MG/ML IJ SOLN
INTRAMUSCULAR | Status: AC
  Filled 2014-07-13: qty 5

## 2014-07-13 MED ORDER — BUPIVACAINE HCL (PF) 0.25 % IJ SOLN
INTRAMUSCULAR | Status: AC
  Filled 2014-07-13: qty 30

## 2014-07-13 MED ORDER — ONDANSETRON HCL 4 MG/2ML IJ SOLN: 4.0000 mg | Freq: Once | INTRAMUSCULAR | Status: DC | PRN

## 2014-07-13 MED ORDER — 0.9 % SODIUM CHLORIDE (POUR BTL) OPTIME
TOPICAL | Status: DC | PRN
  Administered 2014-07-13: 1000 mL

## 2014-07-13 SURGICAL SUPPLY — 60 items
BANDAGE ELASTIC 3 VELCRO ST LF (GAUZE/BANDAGES/DRESSINGS) IMPLANT
BLADE SURG 10 STRL SS (BLADE) ×3 IMPLANT
BNDG COHESIVE 1X5 TAN STRL LF (GAUZE/BANDAGES/DRESSINGS) ×3 IMPLANT
BNDG COHESIVE 4X5 TAN STRL (GAUZE/BANDAGES/DRESSINGS) ×3 IMPLANT
BNDG COHESIVE 6X5 TAN STRL LF (GAUZE/BANDAGES/DRESSINGS) IMPLANT
BNDG CONFORM 2 STRL LF (GAUZE/BANDAGES/DRESSINGS) ×3 IMPLANT
BNDG CONFORM 3 STRL LF (GAUZE/BANDAGES/DRESSINGS) IMPLANT
BNDG GAUZE STRTCH 6 (GAUZE/BANDAGES/DRESSINGS) IMPLANT
CORDS BIPOLAR (ELECTRODE) IMPLANT
COVER SURGICAL LIGHT HANDLE (MISCELLANEOUS) ×3 IMPLANT
CUFF TOURNIQUET SINGLE 18IN (TOURNIQUET CUFF) IMPLANT
CUFF TOURNIQUET SINGLE 24IN (TOURNIQUET CUFF) IMPLANT
CUFF TOURNIQUET SINGLE 34IN LL (TOURNIQUET CUFF) IMPLANT
CUFF TOURNIQUET SINGLE 44IN (TOURNIQUET CUFF) IMPLANT
DRAIN PENROSE 1/4X12 LTX STRL (WOUND CARE) IMPLANT
DRAPE ORTHO SPLIT 77X108 STRL (DRAPES) ×2
DRAPE SURG 17X23 STRL (DRAPES) IMPLANT
DRAPE SURG ORHT 6 SPLT 77X108 (DRAPES) ×4 IMPLANT
DRAPE U-SHAPE 47X51 STRL (DRAPES) ×3 IMPLANT
DURAPREP 26ML APPLICATOR (WOUND CARE) ×3 IMPLANT
ELECT CAUTERY BLADE 6.4 (BLADE) IMPLANT
ELECT REM PT RETURN 9FT ADLT (ELECTROSURGICAL)
ELECTRODE REM PT RTRN 9FT ADLT (ELECTROSURGICAL) IMPLANT
GAUZE SPONGE 4X4 12PLY STRL (GAUZE/BANDAGES/DRESSINGS) IMPLANT
GAUZE XEROFORM 1X8 LF (GAUZE/BANDAGES/DRESSINGS) ×3 IMPLANT
GLOVE BIO SURGEON STRL SZ 6.5 (GLOVE) ×3 IMPLANT
GLOVE BIO SURGEON STRL SZ8 (GLOVE) ×3 IMPLANT
GLOVE BIOGEL PI IND STRL 8 (GLOVE) ×4 IMPLANT
GLOVE BIOGEL PI INDICATOR 8 (GLOVE) ×2
GLOVE ORTHO TXT STRL SZ7.5 (GLOVE) ×3 IMPLANT
GOWN STRL REUS W/ TWL LRG LVL3 (GOWN DISPOSABLE) ×2 IMPLANT
GOWN STRL REUS W/ TWL XL LVL3 (GOWN DISPOSABLE) ×4 IMPLANT
GOWN STRL REUS W/TWL LRG LVL3 (GOWN DISPOSABLE) ×1
GOWN STRL REUS W/TWL XL LVL3 (GOWN DISPOSABLE) ×2
HANDPIECE INTERPULSE COAX TIP (DISPOSABLE)
KIT BASIN OR (CUSTOM PROCEDURE TRAY) ×3 IMPLANT
KIT ROOM TURNOVER OR (KITS) ×3 IMPLANT
MANIFOLD NEPTUNE II (INSTRUMENTS) ×3 IMPLANT
NS IRRIG 1000ML POUR BTL (IV SOLUTION) ×3 IMPLANT
PACK ORTHO EXTREMITY (CUSTOM PROCEDURE TRAY) ×3 IMPLANT
PAD ARMBOARD 7.5X6 YLW CONV (MISCELLANEOUS) ×6 IMPLANT
PADDING CAST ABS 4INX4YD NS (CAST SUPPLIES)
PADDING CAST ABS COTTON 4X4 ST (CAST SUPPLIES) IMPLANT
PADDING CAST COTTON 6X4 STRL (CAST SUPPLIES) IMPLANT
SET HNDPC FAN SPRY TIP SCT (DISPOSABLE) IMPLANT
SPONGE LAP 18X18 X RAY DECT (DISPOSABLE) IMPLANT
STOCKINETTE IMPERVIOUS 9X36 MD (GAUZE/BANDAGES/DRESSINGS) IMPLANT
SUT CHROMIC 5 0 P 3 (SUTURE) ×3 IMPLANT
SUT ETHILON 2 0 FS 18 (SUTURE) ×9 IMPLANT
SUT ETHILON 3 0 PS 1 (SUTURE) ×6 IMPLANT
SUT PROLENE 3 0 SH 48 (SUTURE) ×3 IMPLANT
SYR CONTROL 10ML LL (SYRINGE) IMPLANT
TOWEL OR 17X24 6PK STRL BLUE (TOWEL DISPOSABLE) ×3 IMPLANT
TOWEL OR 17X26 10 PK STRL BLUE (TOWEL DISPOSABLE) ×3 IMPLANT
TUBE ANAEROBIC SPECIMEN COL (MISCELLANEOUS) IMPLANT
TUBE CONNECTING 12X1/4 (SUCTIONS) IMPLANT
TUBE FEEDING 5FR 15 INCH (TUBING) IMPLANT
UNDERPAD 30X30 INCONTINENT (UNDERPADS AND DIAPERS) ×3 IMPLANT
WATER STERILE IRR 1000ML POUR (IV SOLUTION) ×3 IMPLANT
YANKAUER SUCT BULB TIP NO VENT (SUCTIONS) IMPLANT

## 2014-07-13 NOTE — Discharge Instructions (Signed)
What to eat: ° °For your first meals, you should eat lightly; only small meals initially.  If you do not have nausea, you may eat larger meals.  Avoid spicy, greasy and heavy food.   ° °General Anesthesia, Adult, Care After  °Refer to this sheet in the next few weeks. These instructions provide you with information on caring for yourself after your procedure. Your health care provider may also give you more specific instructions. Your treatment has been planned according to current medical practices, but problems sometimes occur. Call your health care provider if you have any problems or questions after your procedure.  °WHAT TO EXPECT AFTER THE PROCEDURE  °After the procedure, it is typical to experience:  °Sleepiness.  °Nausea and vomiting. °HOME CARE INSTRUCTIONS  °For the first 24 hours after general anesthesia:  °Have a responsible person with you.  °Do not drive a car. If you are alone, do not take public transportation.  °Do not drink alcohol.  °Do not take medicine that has not been prescribed by your health care provider.  °Do not sign important papers or make important decisions.  °You may resume a normal diet and activities as directed by your health care provider.  °Change bandages (dressings) as directed.  °If you have questions or problems that seem related to general anesthesia, call the hospital and ask for the anesthetist or anesthesiologist on call. °SEEK MEDICAL CARE IF:  °You have nausea and vomiting that continue the day after anesthesia.  °You develop a rash. °SEEK IMMEDIATE MEDICAL CARE IF:  °You have difficulty breathing.  °You have chest pain.  °You have any allergic problems. °Document Released: 08/11/2000 Document Revised: 01/05/2013 Document Reviewed: 11/18/2012  °[employer]® Patient Information ©2014 [employer].  ° °Sore Throat  ° ° °A sore throat is a painful, burning, sore, or scratchy feeling of the throat. There may be pain or tenderness when swallowing or talking. You may have  other symptoms with a sore throat. These include coughing, sneezing, fever, or a swollen neck. A sore throat is often the first sign of another sickness. These sicknesses may include a cold, flu, strep throat, or an infection called mono. Most sore throats go away without medical treatment.  °HOME CARE  °Only take medicine as told by your doctor.  °Drink enough fluids to keep your pee (urine) clear or pale yellow.  °Rest as needed.  °Try using throat sprays, lozenges, or suck on hard candy (if older than 4 years or as told).  °Sip warm liquids, such as broth, herbal tea, or warm water with honey. Try sucking on frozen ice pops or drinking cold liquids.  °Rinse the mouth (gargle) with salt water. Mix 1 teaspoon salt with 8 ounces of water.  °Do not smoke. Avoid being around others when they are smoking.  °Put a humidifier in your bedroom at night to moisten the air. You can also turn on a hot shower and sit in the bathroom for 5-10 minutes. Be sure the bathroom door is closed. °GET HELP RIGHT AWAY IF:  °You have trouble breathing.  °You cannot swallow fluids, soft foods, or your spit (saliva).  °You have more puffiness (swelling) in the throat.  °Your sore throat does not get better in 7 days.  °You feel sick to your stomach (nauseous) and throw up (vomit).  °You have a fever or lasting symptoms for more than 2-3 days.  °You have a fever and your symptoms suddenly get worse. °MAKE SURE YOU:  °Understand these   instructions.  Will watch your condition.  Will get help right away if you are not doing well or get worse. Document Released: 02/12/2008 Document Revised: 01/28/2012 Document Reviewed: 01/11/2012  [facility] Patient Information 2015 [place], [place]. This information is not intended to replace advice given to you by your health care provider. Make sure you discuss any questions you have with your health care provider.    Keep your middle finger dressing on, clean, and dry

## 2014-07-13 NOTE — Transfer of Care (Signed)
Immediate Anesthesia Transfer of Care Note  Patient: Joel Galloway  Procedure(s) Performed: Procedure(s): EVACUATION HEMATOMA RIGHT MIDDLE FINGER (Right)  EXCISION AND REPAIR OF NAIL MATRIX BED RIGHT MIDDLE FINGER (Right)  Patient Location: PACU  Anesthesia Type:General  Level of Consciousness: awake, alert  and oriented  Airway & Oxygen Therapy: Patient Spontanous Breathing and Patient connected to nasal cannula oxygen  Post-op Assessment: Report given to RN, Post -op Vital signs reviewed and stable and Patient moving all extremities X 4  Post vital signs: Reviewed and stable  Last Vitals:  Filed Vitals:   07/13/14 1436  BP: 148/82  Pulse: 78  Temp: 36.7 C  Resp: 18    Complications: No apparent anesthesia complications

## 2014-07-13 NOTE — Anesthesia Preprocedure Evaluation (Signed)
Anesthesia Evaluation  Patient identified by MRN, date of birth, ID band Patient awake    Reviewed: Allergy & Precautions, NPO status , Patient's Chart, lab work & pertinent test results  Airway        Dental   Pulmonary asthma , sleep apnea ,          Cardiovascular     Neuro/Psych  Headaches,    GI/Hepatic   Endo/Other    Renal/GU      Musculoskeletal  (+) Arthritis -,   Abdominal   Peds  Hematology   Anesthesia Other Findings MS  Reproductive/Obstetrics                             Anesthesia Physical Anesthesia Plan  ASA: III  Anesthesia Plan: Regional   Post-op Pain Management:    Induction: Intravenous  Airway Management Planned: Simple Face Mask  Additional Equipment:   Intra-op Plan:   Post-operative Plan:   Informed Consent: I have reviewed the patients History and Physical, chart, labs and discussed the procedure including the risks, benefits and alternatives for the proposed anesthesia with the patient or authorized representative who has indicated his/her understanding and acceptance.     Plan Discussed with: CRNA, Anesthesiologist and Surgeon  Anesthesia Plan Comments: (Pt request finger block. GES)        Anesthesia Quick Evaluation

## 2014-07-13 NOTE — Brief Op Note (Signed)
07/13/2014  5:42 PM  PATIENT:  [name]  54 y.o. male  PRE-OPERATIVE DIAGNOSIS:  Right middle finger crush injury  POST-OPERATIVE DIAGNOSIS:  Right middle finger crush injury  PROCEDURE:  Procedure(s): EVACUATION HEMATOMA RIGHT MIDDLE FINGER (Right)  EXCISION AND REPAIR OF NAIL MATRIX BED RIGHT MIDDLE FINGER (Right)  SURGEON:  Surgeon(s) and Role:    * Mcarthur Rossetti, MD - Primary  ANESTHESIA:   local and IV sedation  LOCAL MEDICATIONS USED:  MARCAINE     COUNTS:  YES  TOURNIQUET:  * No tourniquets in log *  DICTATION: .Other Dictation: Dictation Number [id number]  PLAN OF CARE: Discharge to home after PACU  PATIENT DISPOSITION:  PACU - hemodynamically stable.   Delay start of Pharmacological VTE agent (>24hrs) due to surgical blood loss or risk of bleeding: not applicable

## 2014-07-13 NOTE — H&P (Signed)
ELYAS VILLAMOR is an 54 y.o. male.   Chief Complaint:   Right middle finger crush injury HPI:   54 yo male who got his right middle finger crushed in a car door nine days ago.  Has an obvious injury to the nail, nail bed, and nail matrix.  He understands he will certainly lose his finger nail.  Surgery is warranted to explore and repair the nail bed and nail matrix.  Past Medical History  Diagnosis Date  . Multiple sclerosis   . Asthma   . Seasonal allergies   . Anxiety   . Hypoglycemia     las t episode 12/24/12  . Arthritis   . Cancer     skin   . Sleep apnea     Pt reports "mild sleep apnea"  . Headache     pt reports migraines    Past Surgical History  Procedure Laterality Date  . Knee surgery Left     x2  arthroscopy  . Nose surgery    . Wisdom tooth extraction    . Posterior fusion lumbar spine  01/10/2013    Dr Louanne Skye  . Back surgery      Family History  Problem Relation Age of Onset  . Ataxia Neg Hx   . Chorea Neg Hx   . Dementia Neg Hx   . Mental retardation Neg Hx   . Migraines Neg Hx   . Multiple sclerosis Neg Hx   . Neurofibromatosis Neg Hx   . Neuropathy Neg Hx   . Parkinsonism Neg Hx   . Seizures Neg Hx   . Stroke Neg Hx    Social History:  reports that he has never smoked. He has never used smokeless tobacco. He reports that he does not drink alcohol or use illicit drugs.  Allergies:  Allergies  Allergen Reactions  . Lyrica [Pregabalin] Other (See Comments)  . Other Anaphylaxis    nuts  . Aspirin   . Benadryl [Diphenhydramine]   . Benztropine   . Gabapentin Other (See Comments)  . Neosporin [Neomycin-Bacitracin Zn-Polymyx] Swelling    Medications Prior to Admission  Medication Sig Dispense Refill  . albuterol (PROVENTIL HFA;VENTOLIN HFA) 108 (90 BASE) MCG/ACT inhaler Inhale 2 puffs into the lungs every 6 (six) hours as needed for wheezing.    . carisoprodol (SOMA) 350 MG tablet Take 1 tablet (350 mg total) by mouth 4 (four) times daily as  needed for muscle spasms. 120 tablet 0  . diazepam (VALIUM) 10 MG tablet Take 10 mg by mouth every 6 (six) hours as needed for anxiety.    Marland Kitchen HYDROcodone-acetaminophen (NORCO) 10-325 MG per tablet Take 1 tablet by mouth every 4 (four) hours as needed for pain (for break through pain every 4 hours.). 60 tablet 1  . ibuprofen (ADVIL,MOTRIN) 200 MG tablet Take 800 mg by mouth every 8 (eight) hours as needed for pain.    Marland Kitchen lisinopril (PRINIVIL,ZESTRIL) 20 MG tablet Take 20 mg by mouth daily.    Marland Kitchen morphine (MS CONTIN) 60 MG 12 hr tablet Take 60 mg by mouth 3 (three) times daily.  0  . omeprazole (PRILOSEC) 20 MG capsule Take 20 mg by mouth daily.    Marland Kitchen OVER THE COUNTER MEDICATION Apply 1 application topically 2 (two) times daily.    . Oxycodone HCl 10 MG TABS Take 10 mg by mouth 3 (three) times daily.  0  . propranolol (INDERAL) 20 MG tablet Take 1 tablet (20 mg total) by mouth 2 (  two) times daily. 60 tablet 3  . tamsulosin (FLOMAX) 0.4 MG CAPS capsule Take 0.4 mg by mouth daily.      Results for orders placed or performed during the hospital encounter of 07/13/14 (from the past 48 hour(s))  CBC     Status: None   Collection Time: 07/13/14  2:26 PM  Result Value Ref Range   WBC 7.5 4.0 - 10.5 K/uL   RBC 4.84 4.22 - 5.81 MIL/uL   Hemoglobin 14.6 13.0 - 17.0 g/dL   HCT 41.8 39.0 - 52.0 %   MCV 86.4 78.0 - 100.0 fL   MCH 30.2 26.0 - 34.0 pg   MCHC 34.9 30.0 - 36.0 g/dL   RDW 12.4 11.5 - 15.5 %   Platelets 317 150 - 400 K/uL  Glucose, capillary     Status: Abnormal   Collection Time: 07/13/14  3:12 PM  Result Value Ref Range   Glucose-Capillary 118 (H) 70 - 99 mg/dL   No results found.  Review of Systems  All other systems reviewed and are negative.   Blood pressure 148/82, pulse 78, temperature 98.1 F (36.7 C), temperature source Oral, resp. rate 18, height 6' (1.829 m), weight 79.379 kg (175 lb), SpO2 99 %. Physical Exam  Constitutional: He is oriented to person, place, and time. He  appears well-developed and well-nourished.  HENT:  Head: Normocephalic and atraumatic.  Eyes: EOM are normal. Pupils are equal, round, and reactive to light.  Neck: Normal range of motion. Neck supple.  Cardiovascular: Normal rate and regular rhythm.   Respiratory: Effort normal and breath sounds normal.  GI: Soft. Bowel sounds are normal.  Musculoskeletal:       Hands: Neurological: He is alert and oriented to person, place, and time.  Skin: Skin is warm and dry.  Psychiatric: He has a normal mood and affect.     Assessment/Plan Right middle finger tip crush injury with nail be and nail matrix injury 1)  To the OR as an outpatient for repair of the nail bed and nail matrix  Keyasha Miah Y 07/13/2014, 4:57 PM

## 2014-07-13 NOTE — Anesthesia Postprocedure Evaluation (Signed)
  Anesthesia Post-op Note  Patient: Joel Galloway  Procedure(s) Performed: Procedure(s): EVACUATION HEMATOMA RIGHT MIDDLE FINGER (Right)  EXCISION AND REPAIR OF NAIL MATRIX BED RIGHT MIDDLE FINGER (Right)  Patient Location: PACU  Anesthesia Type:Regional  Level of Consciousness: awake, alert , oriented and patient cooperative  Airway and Oxygen Therapy: Patient Spontanous Breathing  Post-op Pain: none  Post-op Assessment: Post-op Vital signs reviewed, Patient's Cardiovascular Status Stable, Respiratory Function Stable, Patent Airway, No signs of Nausea or vomiting and Pain level controlled  Post-op Vital Signs: stable  Last Vitals:  Filed Vitals:   07/13/14 1436  BP: 148/82  Pulse: 78  Temp: 36.7 C  Resp: 18    Complications: No apparent anesthesia complications

## 2014-07-14 NOTE — Op Note (Signed)
NAME:  Joel Galloway, Joel Galloway NO.:  [id number]  MEDICAL RECORD NO.:  [id number]  LOCATION:  [facility]                         FACILITY:  [facility]  PHYSICIAN:  Lind Guest. Ninfa Linden, M.D.DATE OF BIRTH:  [birth year]  DATE OF PROCEDURE:  07/13/2014 DATE OF DISCHARGE:  07/13/2014                              OPERATIVE REPORT   PREOPERATIVE DIAGNOSIS:  Right middle finger crush injury with nail bed and nail matrix injury as well as nail hematoma.  POSTOPERATIVE DIAGNOSIS:  Right middle finger crush injury with nail bed and nail matrix injury as well as nail hematoma.  PROCEDURE: 1. Evacuation of right middle finger nail matrix and nail bed     hematoma. 2. Repair of transverse laceration to right middle finger nail bed. 3. Removal of finger nail from the middle finger with placement of     fingernail back under the nail matrix.  SURGEON:  Lind Guest. Ninfa Linden, MD  ANESTHESIA: 1. Local digital nerve block of the right middle finger with 0.25%     plain Sensorcaine. 2. Mask ventilation, IV sedation.  BLOOD LOSS:  Minimal.  COMPLICATIONS:  None.  INDICATIONS:  Mr. Clubb is a 54 year old who 9 days ago sustained a crush injury to his right middle finger when it slammed in a car door. He did not seek treatment until yesterday when I saw him in the office. He had a large subungual hematoma involving the matrix and the nail bed with an obvious tuft fracture.  I recommended he undergo an operative repair of this.  He understands that he will lose the middle fingernail due to the crushing injury of this finger and is being now 9 days out from his original injury.  PROCEDURE DESCRIPTION:  After informed consent appropriate right middle finger was marked.  Anesthesia obtained a local block in the holding room and brought him into the operating room and then blocked his middle finger again.  His right hand was prepped and draped with DuraPrep and sterile drapes.  Mask  ventilation and IV sedation was as obtained.  A time-out was called and he was identified as the correct patient and correct right middle finger.  I then was able to evacuate the hematoma easily from the matrix and the nail was certainly loose so it came off easily. I found a transverse laceration nail bed which I repaired with interrupted 5-0 chromic suture.  I then placed the nail gently back under the nail matrix and sewed it into place using interrupted 3-0 Prolene suture for temporary protection.  We then cleaned the finger off and placed Xeroform and well-padded sterile dressing to the finger.  He was taken to recovery room in stable condition.  All final counts were correct, and there were no complications noted.     Lind Guest. Ninfa Linden, M.D.     CYB/MEDQ  D:  07/13/2014  T:  07/14/2014  Job:  [id number]

## 2014-07-17 ENCOUNTER — Encounter (HOSPITAL_COMMUNITY): Payer: Self-pay | Admitting: Orthopaedic Surgery

## 2014-12-25 ENCOUNTER — Other Ambulatory Visit: Payer: Self-pay | Admitting: Specialist

## 2014-12-25 DIAGNOSIS — M545 Low back pain: Secondary | ICD-10-CM

## 2015-01-02 ENCOUNTER — Ambulatory Visit
Admission: RE | Admit: 2015-01-02 | Discharge: 2015-01-02 | Disposition: A | Discharge status: Home / Self Care | Payer: 59 | Source: Ambulatory Visit | Attending: Specialist | Admitting: Specialist

## 2015-01-02 DIAGNOSIS — M545 Low back pain: Secondary | ICD-10-CM

## 2015-01-02 MED ORDER — GADOBENATE DIMEGLUMINE 529 MG/ML IV SOLN
15.0000 mL | Freq: Once | INTRAVENOUS | Status: AC | PRN
  Administered 2015-01-02: 15 mL via INTRAVENOUS

## 2016-03-21 ENCOUNTER — Encounter: Payer: Self-pay | Admitting: Family Medicine

## 2016-04-02 ENCOUNTER — Ambulatory Visit (INDEPENDENT_AMBULATORY_CARE_PROVIDER_SITE_OTHER): Payer: Self-pay | Admitting: Specialist

## 2016-04-21 ENCOUNTER — Ambulatory Visit (INDEPENDENT_AMBULATORY_CARE_PROVIDER_SITE_OTHER): Payer: Medicare Other | Admitting: Orthopaedic Surgery

## 2016-04-21 ENCOUNTER — Ambulatory Visit (INDEPENDENT_AMBULATORY_CARE_PROVIDER_SITE_OTHER): Payer: 59 | Admitting: Orthopaedic Surgery

## 2016-04-21 DIAGNOSIS — S2222XA Fracture of body of sternum, initial encounter for closed fracture: Secondary | ICD-10-CM
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
## 2016-05-17 ENCOUNTER — Emergency Department (HOSPITAL_COMMUNITY)
Admission: EM | Admit: 2016-05-17 | Discharge: 2016-05-17 | Disposition: A | Discharge status: Home / Self Care | Payer: 59 | Attending: Emergency Medicine | Admitting: Emergency Medicine

## 2016-05-17 ENCOUNTER — Encounter (HOSPITAL_COMMUNITY): Payer: Self-pay | Admitting: Emergency Medicine

## 2016-05-17 DIAGNOSIS — R109 Unspecified abdominal pain: Secondary | ICD-10-CM

## 2016-05-17 DIAGNOSIS — Z85828 Personal history of other malignant neoplasm of skin: Secondary | ICD-10-CM | POA: Diagnosis not present

## 2016-05-17 DIAGNOSIS — J45909 Unspecified asthma, uncomplicated: Secondary | ICD-10-CM | POA: Diagnosis not present

## 2016-05-17 DIAGNOSIS — E876 Hypokalemia: Secondary | ICD-10-CM

## 2016-05-17 DIAGNOSIS — R339 Retention of urine, unspecified: Secondary | ICD-10-CM | POA: Diagnosis present

## 2016-05-17 LAB — URINALYSIS, ROUTINE W REFLEX MICROSCOPIC
Bacteria, UA: NONE SEEN
Bilirubin Urine: NEGATIVE
Glucose, UA: NEGATIVE mg/dL
Ketones, ur: 5 mg/dL — AB
Leukocytes, UA: NEGATIVE
Nitrite: NEGATIVE
Protein, ur: NEGATIVE mg/dL
Specific Gravity, Urine: 1.023 (ref 1.005–1.030)
pH: 5 (ref 5.0–8.0)

## 2016-05-17 LAB — CBC
HCT: 29.4 % — ABNORMAL LOW (ref 39.0–52.0)
Hemoglobin: 10.1 g/dL — ABNORMAL LOW (ref 13.0–17.0)
MCH: 31.7 pg (ref 26.0–34.0)
MCHC: 34.4 g/dL (ref 30.0–36.0)
MCV: 92.2 fL (ref 78.0–100.0)
Platelets: 320 10*3/uL (ref 150–400)
RBC: 3.19 MIL/uL — ABNORMAL LOW (ref 4.22–5.81)
RDW: 13.7 % (ref 11.5–15.5)
WBC: 9.4 10*3/uL (ref 4.0–10.5)

## 2016-05-17 LAB — COMPREHENSIVE METABOLIC PANEL
ALT: 70 U/L — ABNORMAL HIGH (ref 17–63)
AST: 19 U/L (ref 15–41)
Albumin: 3.3 g/dL — ABNORMAL LOW (ref 3.5–5.0)
Alkaline Phosphatase: 52 U/L (ref 38–126)
Anion gap: 7 (ref 5–15)
BUN: 11 mg/dL (ref 6–20)
CO2: 24 mmol/L (ref 22–32)
Calcium: 8.5 mg/dL — ABNORMAL LOW (ref 8.9–10.3)
Chloride: 109 mmol/L (ref 101–111)
Creatinine, Ser: 0.76 mg/dL (ref 0.61–1.24)
GFR calc Af Amer: >60 mL/min (ref >60)
GFR calc non Af Amer: >60 mL/min (ref >60)
Glucose, Bld: 107 mg/dL — ABNORMAL HIGH (ref 65–99)
Potassium: 2.7 mmol/L — CL (ref 3.5–5.1)
Sodium: 140 mmol/L (ref 135–145)
Total Bilirubin: 0.4 mg/dL (ref 0.3–1.2)
Total Protein: 5.9 g/dL — ABNORMAL LOW (ref 6.5–8.1)

## 2016-05-17 LAB — I-STAT TROPONIN, ED: Troponin i, poc: 0 ng/mL (ref 0.00–0.08)

## 2016-05-17 LAB — LIPASE, BLOOD: Lipase: 62 U/L — ABNORMAL HIGH (ref 11–51)

## 2016-05-17 MED ORDER — MAGNESIUM OXIDE 400 (241.3 MG) MG PO TABS
800.0000 mg | ORAL_TABLET | Freq: Once | ORAL | Status: AC
  Administered 2016-05-17: 800 mg via ORAL
  Filled 2016-05-17: qty 2

## 2016-05-17 MED ORDER — TAMSULOSIN HCL 0.4 MG PO CAPS: 0.4000 mg | ORAL_CAPSULE | Freq: Every day | ORAL | 0 refills | Status: DC

## 2016-05-17 MED ORDER — POTASSIUM CHLORIDE CRYS ER 20 MEQ PO TBCR
60.0000 meq | EXTENDED_RELEASE_TABLET | Freq: Once | ORAL | Status: AC
  Administered 2016-05-17: 60 meq via ORAL
  Filled 2016-05-17: qty 3

## 2016-05-17 NOTE — ED Triage Notes (Signed)
Pt. Stated, I have renal failure and heart failure.  I've not used the bathroom in 4 days. I was at [facility] for renal failure. My stomach's hurting and I have a little chest pain.

## 2016-05-17 NOTE — ED Notes (Signed)
Notified Dr Wilson Singer of critical potassium 2.7.

## 2016-05-17 NOTE — ED Provider Notes (Signed)
[facility] DEPT Provider Note   CSN: [id number] Arrival date & time: 05/17/16  [id number]  By signing my name below, I, Sonum Patel, attest that this documentation has been prepared under the direction and in the presence of Virgel Manifold, MD. Electronically Signed: Sonum Patel, Education administrator. 05/17/16. 10:24 AM.  History   Chief Complaint Chief Complaint  Patient presents with  . Abdominal Pain  . Chest Pain  . Back Pain    The history is provided by the patient. No language interpreter was used.     HPI Comments: Joel Galloway is a 55 y.o. male with past medical history of multiple sclerosis who presents to the Emergency Department complaining of unchanged urinary retention with his last void being 3-4 days ago. Patient states he has had similar issues in the past but is unsure if the bladder issues are related to his prostate or MS. He has had to have a catheter for this in the past. He has taken Flomax in the past but was recently discontinued. He denies any modifying factors. He denies any associated symptoms at this time.   Past Medical History:  Diagnosis Date  . Anxiety   . Arthritis   . Asthma   . Cancer ([place])    skin   . Headache    pt reports migraines  . Hypoglycemia    las t episode 12/24/12  . Multiple sclerosis ([place])   . Seasonal allergies   . Sleep apnea    Pt reports "mild sleep apnea"    Patient Active Problem List   Diagnosis Date Noted  . Nail bed injury right middle finger 07/13/2014  . Lumbar degenerative disc disease 01/12/2013    Class: Chronic  . HNP (herniated nucleus pulposus), lumbar 01/12/2013    Class: Chronic  . Spinal stenosis, lumbar region, with neurogenic claudication 01/12/2013    Class: Chronic    Past Surgical History:  Procedure Laterality Date  . BACK SURGERY    . HEMATOMA EVACUATION Right 07/13/2014   Procedure: EVACUATION HEMATOMA RIGHT MIDDLE FINGER;  Surgeon: Mcarthur Rossetti, MD;  Location: [facility];  Service: Orthopedics;   Laterality: Right;  . KNEE SURGERY Left    x2  arthroscopy  . NOSE SURGERY    . POSTERIOR FUSION LUMBAR SPINE  01/10/2013   Dr Louanne Skye  . WISDOM TOOTH EXTRACTION         Home Medications    Prior to Admission medications   Medication Sig Start Date End Date Taking? Authorizing Provider  albuterol (PROVENTIL HFA;VENTOLIN HFA) 108 (90 BASE) MCG/ACT inhaler Inhale 2 puffs into the lungs every 6 (six) hours as needed for wheezing.    Historical Provider, MD  carisoprodol (SOMA) 350 MG tablet Take 1 tablet (350 mg total) by mouth 4 (four) times daily as needed for muscle spasms. 01/13/13   Jessy Oto, MD  diazepam (VALIUM) 10 MG tablet Take 10 mg by mouth every 6 (six) hours as needed for anxiety.    Historical Provider, MD  famotidine (PEPCID) 40 MG tablet Take 40 mg by mouth.    Historical Provider, MD  HYDROcodone-acetaminophen (NORCO) 10-325 MG per tablet Take 1 tablet by mouth every 4 (four) hours as needed for pain (for break through pain every 4 hours.). Patient not taking: Reported on 04/21/2016 01/13/13   Jessy Oto, MD  ibuprofen (ADVIL,MOTRIN) 200 MG tablet Take 800 mg by mouth every 8 (eight) hours as needed for pain.    Historical Provider, MD  lisinopril (PRINIVIL,ZESTRIL)  20 MG tablet Take 20 mg by mouth daily.    Historical Provider, MD  morphine (MS CONTIN) 60 MG 12 hr tablet Take 60 mg by mouth 3 (three) times daily. 07/05/14   Historical Provider, MD  omeprazole (PRILOSEC) 20 MG capsule Take 20 mg by mouth daily.    Historical Provider, MD  OVER THE COUNTER MEDICATION Apply 1 application topically 2 (two) times daily.    Historical Provider, MD  Oxycodone HCl 10 MG TABS Take 10 mg by mouth 3 (three) times daily. 07/05/14   Historical Provider, MD  oxyCODONE-acetaminophen (ROXICET) 5-325 MG per tablet Take 1-2 tablets by mouth every 4 (four) hours as needed. 07/13/14   Mcarthur Rossetti, MD  propranolol (INDERAL) 20 MG tablet Take 1 tablet (20 mg total) by mouth 2 (two)  times daily. 05/26/13   Pieter Partridge, DO  tamsulosin (FLOMAX) 0.4 MG CAPS capsule Take 0.4 mg by mouth daily.    Historical Provider, MD    Family History Family History  Problem Relation Age of Onset  . Ataxia Neg Hx   . Chorea Neg Hx   . Dementia Neg Hx   . Mental retardation Neg Hx   . Migraines Neg Hx   . Multiple sclerosis Neg Hx   . Neurofibromatosis Neg Hx   . Neuropathy Neg Hx   . Parkinsonism Neg Hx   . Seizures Neg Hx   . Stroke Neg Hx     Social History Social History  Substance Use Topics  . Smoking status: Never Smoker  . Smokeless tobacco: Never Used  . Alcohol use No     Allergies   Lyrica [pregabalin]; Other; Aspirin; Benadryl [diphenhydramine]; Benztropine; Gabapentin; and Neosporin [neomycin-bacitracin zn-polymyx]   Review of Systems Review of Systems  A complete 10 system review of systems was obtained and all systems are negative except as noted in the HPI and PMH.   Physical Exam Updated Vital Signs BP 143/93 (BP Location: Right Arm)   Pulse 89   Temp 97.9 F (36.6 C)   Resp 17   Ht 6\' 1"  (1.854 m)   SpO2 100%   Physical Exam  Constitutional: He is oriented to person, place, and time. He appears well-developed and well-nourished.  HENT:  Head: Normocephalic and atraumatic.  Eyes: EOM are normal.  Neck: Normal range of motion.  Cardiovascular: Normal rate, regular rhythm, normal heart sounds and intact distal pulses.   Pulmonary/Chest: Effort normal and breath sounds normal. No respiratory distress.  Abdominal: Soft. He exhibits no distension. There is tenderness.  Mild lower abdominal tenderness. Bladder doesn't seem distended.    Genitourinary:  Genitourinary Comments: Normal external genitalia  Musculoskeletal: Normal range of motion.  Neurological: He is alert and oriented to person, place, and time.  Skin: Skin is warm and dry. Ecchymosis noted.  Multiple ecchymosis to BUE consistent with recent IV placement.   Psychiatric: He has  a normal mood and affect. Judgment normal.  Nursing note and vitals reviewed.    ED Treatments / Results  DIAGNOSTIC STUDIES: Oxygen Saturation is 100% on RA, normal by my interpretation.    COORDINATION OF CARE: 10:00 AM Discussed treatment plan with pt at bedside and pt agreed to plan.   Labs (all labs ordered are listed, but only abnormal results are displayed) Labs Reviewed  LIPASE, BLOOD - Abnormal; Notable for the following:       Result Value   Lipase 62 (*)    All other components within normal limits  COMPREHENSIVE METABOLIC PANEL - Abnormal; Notable for the following:    Potassium 2.7 (*)    Glucose, Bld 107 (*)    Calcium 8.5 (*)    Total Protein 5.9 (*)    Albumin 3.3 (*)    ALT 70 (*)    All other components within normal limits  CBC - Abnormal; Notable for the following:    RBC 3.19 (*)    Hemoglobin 10.1 (*)    HCT 29.4 (*)    All other components within normal limits  URINALYSIS, ROUTINE W REFLEX MICROSCOPIC - Abnormal; Notable for the following:    Hgb urine dipstick SMALL (*)    Ketones, ur 5 (*)    Squamous Epithelial / LPF 0-5 (*)    All other components within normal limits  I-STAT TROPOININ, ED    EKG  EKG Interpretation None       Radiology No results found.  Procedures Procedures (including critical care time)  Medications Ordered in ED Medications - No data to display   Initial Impression / Assessment and Plan / ED Course  I have reviewed the triage vital signs and the nursing notes.  Pertinent labs & imaging results that were available during my care of the patient were reviewed by me and considered in my medical decision making (see chart for details).  Clinical Course     He voided in the ED w/o difficulty.UA ok.OutptFU.   Final Clinical Impressions(s) / ED Diagnoses   Final diagnoses:  Abdominal discomfort  Hypokalemia    New Prescriptions New Prescriptions   No medications on file   I personally preformed the  services scribed in my presence. The recorded information has been reviewed is accurate. Virgel Manifold, MD.    Virgel Manifold, MD 05/22/16 [phone]

## 2016-05-26 ENCOUNTER — Ambulatory Visit (HOSPITAL_BASED_OUTPATIENT_CLINIC_OR_DEPARTMENT_OTHER)
Admission: RE | Admit: 2016-05-26 | Discharge: 2016-05-26 | Disposition: A | Discharge status: Home / Self Care | Payer: 59 | Source: Ambulatory Visit | Attending: Orthopaedic Surgery | Admitting: Orthopaedic Surgery

## 2016-05-26 ENCOUNTER — Other Ambulatory Visit (INDEPENDENT_AMBULATORY_CARE_PROVIDER_SITE_OTHER): Payer: Self-pay | Admitting: Orthopaedic Surgery

## 2016-05-26 ENCOUNTER — Ambulatory Visit (INDEPENDENT_AMBULATORY_CARE_PROVIDER_SITE_OTHER): Payer: 59 | Admitting: Orthopaedic Surgery

## 2016-05-26 DIAGNOSIS — Z09 Encounter for follow-up examination after completed treatment for conditions other than malignant neoplasm: Secondary | ICD-10-CM | POA: Diagnosis not present

## 2016-05-26 DIAGNOSIS — M79641 Pain in right hand: Secondary | ICD-10-CM | POA: Insufficient documentation

## 2016-05-26 DIAGNOSIS — G8929 Other chronic pain: Secondary | ICD-10-CM | POA: Diagnosis not present

## 2016-05-26 DIAGNOSIS — M545 Low back pain, unspecified: Secondary | ICD-10-CM | POA: Insufficient documentation

## 2016-05-26 DIAGNOSIS — M25571 Pain in right ankle and joints of right foot: Secondary | ICD-10-CM

## 2016-05-26 DIAGNOSIS — Z9889 Other specified postprocedural states: Secondary | ICD-10-CM | POA: Insufficient documentation

## 2016-05-26 DIAGNOSIS — S2222XA Fracture of body of sternum, initial encounter for closed fracture: Secondary | ICD-10-CM | POA: Insufficient documentation

## 2016-05-26 DIAGNOSIS — S2222XD Fracture of body of sternum, subsequent encounter for fracture with routine healing: Secondary | ICD-10-CM
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
## 2016-06-02 ENCOUNTER — Ambulatory Visit (INDEPENDENT_AMBULATORY_CARE_PROVIDER_SITE_OTHER): Payer: 59 | Admitting: Neurology

## 2016-06-02 ENCOUNTER — Encounter: Payer: Self-pay | Admitting: Neurology

## 2016-06-02 VITALS — BP 138/86 | HR 78 | Resp 16 | Ht 73.0 in | Wt 178.0 lb

## 2016-06-02 DIAGNOSIS — R200 Anesthesia of skin: Secondary | ICD-10-CM | POA: Diagnosis not present

## 2016-06-02 DIAGNOSIS — R252 Cramp and spasm: Secondary | ICD-10-CM | POA: Diagnosis not present

## 2016-06-02 DIAGNOSIS — R350 Frequency of micturition: Secondary | ICD-10-CM

## 2016-06-02 DIAGNOSIS — Z8669 Personal history of other diseases of the nervous system and sense organs: Secondary | ICD-10-CM

## 2016-06-02 DIAGNOSIS — G35 Multiple sclerosis: Secondary | ICD-10-CM | POA: Diagnosis not present

## 2016-06-02 DIAGNOSIS — R269 Unspecified abnormalities of gait and mobility: Secondary | ICD-10-CM

## 2016-06-02 DIAGNOSIS — R5383 Other fatigue: Secondary | ICD-10-CM

## 2016-06-02 DIAGNOSIS — E559 Vitamin D deficiency, unspecified: Secondary | ICD-10-CM | POA: Diagnosis not present

## 2016-06-02 DIAGNOSIS — R7989 Other specified abnormal findings of blood chemistry: Secondary | ICD-10-CM

## 2016-06-02 MED ORDER — TIZANIDINE HCL 4 MG PO TABS: ORAL_TABLET | ORAL | 3 refills | Status: DC

## 2016-06-02 MED ORDER — DOXEPIN HCL 10 MG PO CAPS: 10.0000 mg | ORAL_CAPSULE | Freq: Every day | ORAL | 5 refills | Status: DC

## 2016-06-02 NOTE — Progress Notes (Signed)
[facility]  PATIENT: Joel Galloway DOB: 04-21-1961  REFERRING DOCTOR OR PCP:  Referred by Tamsen Roers.   Also sees [name] SOURCE: patient, notes form Dr. Rodolph Bong, notes from Dr. Tomi Likens, MRI results  _________________________________   HISTORICAL  CHIEF COMPLAINT:  Chief Complaint  Patient presents with  . Multiple Sclerosis    [name] is here with his wife Wilhemena Durie for eval of MS.  Sts. he was dx. in 2004.  Presenting sx. was right optic neuritis, right sided numbness.  Sts. dx. confirmed with MRI and LP. He doesn't recall the name of the neurologist he initially saw in [place].  Sts. he was first on Avonex, but stopped due to flu like sx. Sts. he also tried either Rebif or Betaseron, not able to remember which one, and wasn't able to tolerate it either. Denies trying other MS therapies.  Some gait difficulty, right leg weaker than   . Gait Disturbance    left.  Sts. the med that has helped the most is marijuana given with a vaporizer, but his children made him stop using it.  Sts. he has been on MS Contin for chronic pain, but is out/fim     HISTORY OF PRESENT ILLNESS:  I had the pleasure seeing patient, Joel Galloway, at [facility] for neurologic consultation regarding his history of multiple sclerosis.  He presented with right optic neuritis in 2003 or 2004.    The next year, he states he  had right arm and leg weakness and clumsiness.  He saw Dr. Tressie Ellis that the [facility] in [place] and was diagnosed with MS. He reports having an LP in the past and thinks one was done in [place] and one in [place] but not sure exactly where (we don't have any results from any of his MS evaluations).   He took Avonex x 3 years but felt terrible.   He tried a second interferon x 2-3 more years but also felt terrible. He was last on an MS DMT about 5 years ago.   About 2014, he had a left foot drop and received steroids.  He saw Dr. Tomi Likens in 2014 and an LP  was ordered but he did not have it done.    He reports in November 2017 he had a right foot drop . He reports being placed on oral steroids..       On 04/11/16 and 05/09/2016, he presented to [facility] with altered mental status that was felt to probably be due to polysubstance abuse.    I reviewed those admissions.   He saw Psychiatry the second one.    Gait/strength/sensation:   He reports poor balance and leg weakness.    He is wearing a right ankle brace.    He reports numbness in both feet.     Bladder:    He reports urinary frequency and urgency. He also has had urinary hesitancy and is on tamsulosin.    He has occasional urinary incontinence.     Vision:   He feels vision is reduced, right eye worse than left.     Mood/Cognition:   He has depression and anxiety.  He notes difficulty with short term memory,   He often misplaces items and has poor focus and attention.   He sometimes has trouble with word finding.     Fatigue/Sleep:   He reports fatigue everyday.   He does worse as the day goes on and with heat.  He tries to stay cool.   He has difficulty with sleep, both sleep onset and sleep maintenance.    He does not snore.     LBP/Leg pain:   He has had surgery at L5S1 Louanne Skye).   He had had large right L5S1 HNP.     Record Review:   His recent hospital admissions were reviewed. I also looked at notes from Dr. Tomi Likens from late 2014 and early 2015.    I reviewed the MRI results from 05/18/13.   There are a few nonspecific white matter foci in the hemispheres reported. Pearson films are not available.   Recent lab work from his recent hospital stay showed hypokalemia.   REVIEW OF SYSTEMS: Constitutional: No fevers, chills, sweats, or change in appetite.  He reports fatigue and insomnia Eyes: Notes blurry vision.   Ear, nose and throat: No hearing loss, ear pain, nasal congestion, sore throat Cardiovascular: No chest pain, palpitations Respiratory: No shortness of  breath at rest or with exertion.   No wheezes GastrointestinaI: No nausea, vomiting, diarrhea, abdominal pain, fecal incontinence Genitourinary: No dysuria, urinary retention or frequency.  No nocturia. Musculoskeletal: Notes back pain Integumentary: No rash, pruritus, skin lesions Neurological: as above Psychiatric: Notes depression and anxiety Endocrine: No palpitations, diaphoresis, change in appetite, change in weigh or increased thirst Hematologic/Lymphatic: No anemia, purpura, petechiae. Allergic/Immunologic: No itchy/runny eyes, nasal congestion, recent allergic reactions, rashes  ALLERGIES: Allergies  Allergen Reactions  . Antihistamines, Chlorpheniramine-Type Other (See Comments)    Chest tightness  . Lyrica [Pregabalin] Palpitations  . Other Anaphylaxis    nuts  . Neosporin [Neomycin-Bacitracin Zn-Polymyx] Swelling  . Aspirin Other (See Comments)    Unknown  . Benadryl [Diphenhydramine] Other (See Comments)    Unknown  . Benztropine Other (See Comments)    Unknown  . Protein Other (See Comments)    Unknown  . Gabapentin Palpitations  . Propranolol Nausea Only    HOME MEDICATIONS:  Current Outpatient Prescriptions:  .  acetaminophen (TYLENOL) 500 MG tablet, Take 500 mg by mouth every 6 (six) hours as needed., Disp: , Rfl:  .  albuterol (PROVENTIL HFA;VENTOLIN HFA) 108 (90 BASE) MCG/ACT inhaler, Inhale 2 puffs into the lungs every 6 (six) hours as needed for wheezing., Disp: , Rfl:  .  diazepam (VALIUM) 10 MG tablet, Take 10 mg by mouth 3 (three) times daily as needed for anxiety. , Disp: , Rfl:  .  diclofenac sodium (VOLTAREN) 1 % GEL, Apply 1 application topically as needed (for pain)., Disp: , Rfl:  .  famotidine (PEPCID) 40 MG tablet, Take 40 mg by mouth daily. , Disp: , Rfl:  .  NIFEdipine (PROCARDIA-XL/ADALAT-CC/NIFEDICAL-XL) 30 MG 24 hr tablet, Take 30 mg by mouth daily., Disp: , Rfl:  .  tamsulosin (FLOMAX) 0.4 MG CAPS capsule, Take 0.4 mg by mouth daily.,  Disp: , Rfl:  .  traMADol (ULTRAM) 50 MG tablet, Take 50 mg by mouth 3 (three) times daily as needed., Disp: , Rfl:  .  carisoprodol (SOMA) 350 MG tablet, Take 1 tablet (350 mg total) by mouth 4 (four) times daily as needed for muscle spasms. (Patient not taking: Reported on 06/02/2016), Disp: 120 tablet, Rfl: 0 .  cyclobenzaprine (FLEXERIL) 10 MG tablet, Take 10 mg by mouth 4 (four) times daily., Disp: , Rfl:  .  doxepin (SINEQUAN) 10 MG capsule, Take 10 mg by mouth daily., Disp: , Rfl:  .  morphine (MS CONTIN) 60 MG 12 hr tablet, Take 60 mg by mouth  2 (two) times daily. , Disp: , Rfl: 0 .  testosterone cypionate (DEPOTESTOSTERONE CYPIONATE) 200 MG/ML injection, , Disp: , Rfl: 0  PAST MEDICAL HISTORY: Past Medical History:  Diagnosis Date  . Anxiety   . Arthritis   . Asthma   . Cancer ([place])    skin   . Chronic kidney disease   . Coronary artery disease   . Headache    pt reports migraines  . Hypoglycemia    las t episode 12/24/12  . Multiple sclerosis ([place])   . Seasonal allergies   . Seizures ([place])   . Sleep apnea    Pt reports "mild sleep apnea"  . Vision abnormalities     PAST SURGICAL HISTORY: Past Surgical History:  Procedure Laterality Date  . BACK SURGERY    . HEMATOMA EVACUATION Right 07/13/2014   Procedure: EVACUATION HEMATOMA RIGHT MIDDLE FINGER;  Surgeon: Mcarthur Rossetti, MD;  Location: [facility];  Service: Orthopedics;  Laterality: Right;  . KNEE SURGERY Left    x2  arthroscopy  . NOSE SURGERY    . POSTERIOR FUSION LUMBAR SPINE  01/10/2013   Dr Louanne Skye  . WISDOM TOOTH EXTRACTION      FAMILY HISTORY: Family History  Problem Relation Age of Onset  . Hypertension Mother   . Obesity Mother   . Diabetes Mother   . Anxiety disorder Mother   . Ataxia Neg Hx   . Chorea Neg Hx   . Dementia Neg Hx   . Mental retardation Neg Hx   . Migraines Neg Hx   . Multiple sclerosis Neg Hx   . Neurofibromatosis Neg Hx   . Neuropathy Neg Hx   . Parkinsonism Neg Hx   .  Seizures Neg Hx   . Stroke Neg Hx     SOCIAL HISTORY:  Social History   Social History  . Marital status: Married    Spouse name: N/A  . Number of children: N/A  . Years of education: N/A   Occupational History  . Not on file.   Social History Main Topics  . Smoking status: Never Smoker  . Smokeless tobacco: Never Used  . Alcohol use No  . Drug use: No  . Sexual activity: Not on file   Other Topics Concern  . Not on file   Social History Narrative  . No narrative on file     PHYSICAL EXAM  Vitals:   06/02/16 0848  BP: 138/86  Pulse: 78  Resp: 16  Weight: 178 lb (80.7 kg)  Height: 6\' 1"  (1.854 m)    Body mass index is 23.48 kg/m.   General: The patient is well-developed and well-nourished and in no acute distress  Eyes:  Funduscopic exam shows normal optic discs and retinal vessels.  Neck: The neck is supple, no carotid bruits are noted.  The neck is nontender.  Cardiovascular: The heart has a regular rate and rhythm with a normal S1 and S2. There were no murmurs, gallops or rubs. Lungs are clear to auscultation.  Skin: Extremities are without significant edema.  Musculoskeletal:  Back is nontender  Neurologic Exam  Mental status: The patient is alert and oriented x 3 at the time of the examination. The patient has apparent normal recent and remote memory, with an apparently normal attention span and concentration ability.   Speech is normal.  Cranial nerves: Extraocular movements are full. Pupils are equal, round, and reactive to light and accomodation.  Visual fields are full.  He reports reduced  sensation to touch, temp and vibration on the right.  There is some functional component as frontal bone vibration sensation is asymmetric.  .Facial strength is normal.  Trapezius and sternocleidomastoid strength is normal. No dysarthria is noted.  The tongue is midline, and the patient has symmetric elevation of the soft palate. No obvious hearing deficits are  noted.  Motor:  Muscle bulk is normal.   Tone is normal. Strength is  5 / 5 in all 4 extremities.   Sensory: He reports reduced right arm and leg pinprick, soft touch and vibration sensation.  In the right foot, he has less sensation in the S1 sensation to touch.    Coordination: Cerebellar testing reveals good finger-nose-finger and heel-to-shin bilaterally.  Gait and station: Station is normal.   Gait is mildly wide and he has a variable left foot drop. Tandem gait is wide. Romberg is positive.   Reflexes: Deep tendon reflexes are symmetric and normal in arms and reduced in right ankle.   Plantar responses are flexor.    DIAGNOSTIC DATA (LABS, IMAGING, TESTING) - I reviewed patient records, labs, notes, testing and imaging myself where available.  Lab Results  Component Value Date   WBC 9.4 05/17/2016   HGB 10.1 (L) 05/17/2016   HCT 29.4 (L) 05/17/2016   MCV 92.2 05/17/2016   PLT 320 05/17/2016      Component Value Date/Time   NA 140 05/17/2016 0858   K 2.7 (LL) 05/17/2016 0858   CL 109 05/17/2016 0858   CO2 24 05/17/2016 0858   GLUCOSE 107 (H) 05/17/2016 0858   BUN 11 05/17/2016 0858   CREATININE 0.76 05/17/2016 0858   CALCIUM 8.5 (L) 05/17/2016 0858   PROT 5.9 (L) 05/17/2016 0858   ALBUMIN 3.3 (L) 05/17/2016 0858   AST 19 05/17/2016 0858   ALT 70 (H) 05/17/2016 0858   ALKPHOS 52 05/17/2016 0858   BILITOT 0.4 05/17/2016 0858   GFRNONAA >60 05/17/2016 0858   GFRAA >60 05/17/2016 0858       ASSESSMENT AND PLAN  Low vitamin D level - Plan: VITAMIN D 25 Hydroxy (Vit-D Deficiency, Fractures)  MS (multiple sclerosis) (HCC) - Plan: MR BRAIN W WO CONTRAST, MR CERVICAL SPINE W WO CONTRAST, Neuromyelitis optica autoab, IgG  History of optic neuritis - Plan: Neuromyelitis optica autoab, IgG  Numbness - Plan: MR BRAIN W WO CONTRAST, MR CERVICAL SPINE W WO CONTRAST  Gait disturbance - Plan: MR BRAIN W WO CONTRAST, MR CERVICAL SPINE W WO CONTRAST  Urinary  frequency  Other fatigue  Leg cramp   In summary, Trendyn Arrellano is a 56 year old man who reportedly was diagnosed with multiple sclerosis many years ago after presenting with optic neuritis and right-sided motor and sensory symptoms.   He reports having several exacerbations over the last 12 years. He has been on disease modifying therapies  about half of that time.    The MRI of the brain reportedly just shows a few nonspecific white matter foci which would be very unusual for a patient who has had long-standing MS, with many years disease modifying therapies.   I am uncertain of the diagnosis of MS and we need to [place] to determine if this is his diagnosis and he can start disease modifying therapies.  I will try to get his old MRI images and compare them to a new MRI of the brain and also check an MRI of the cervical spine as several of his possible exacerbations could have been spinal.  If there are significant changes in the MRI of the brain consistent with MS or if he has plaques in the spinal cord consistent with MS, then I would want him to start a disease modifying therapy., If not, then he may not have MS.    Also check a vitamin D level and a neuromyelitis optica IgG (to rule out Devic's disease).  He will return to see me in 3-4 months or sooner if there are new or worsening neurologic symptoms. I will call him in sooner if we need to start a disease modifying therapy.  Thank you for asking me to see Mr. Lovena Le. Please let me know if I can be of further assistance with her or other patients in the future.   Abbye Lao A. Felecia Shelling, MD, PhD [id number], [id number] AM Certified in Neurology, Clinical Neurophysiology, Sleep Medicine, Pain Medicine and Neuroimaging  [facility] [address] [place], [place] [address] [phone]

## 2016-06-06 LAB — NEUROMYELITIS OPTICA AUTOAB, IGG

## 2016-06-06 LAB — VITAMIN D 25 HYDROXY (VIT D DEFICIENCY, FRACTURES): Vit D, 25-Hydroxy: 25.4 ng/mL — ABNORMAL LOW (ref 30.0–100.0)

## 2016-06-09 ENCOUNTER — Telehealth (INDEPENDENT_AMBULATORY_CARE_PROVIDER_SITE_OTHER): Payer: Self-pay | Admitting: *Deleted

## 2016-06-09 ENCOUNTER — Telehealth: Payer: Self-pay | Admitting: Neurology
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
## 2016-06-09 NOTE — Telephone Encounter (Signed)
I have spoken with Joel Galloway this morning.  He has Diazepam at home and will take one of these prior to MRI/fim

## 2016-06-09 NOTE — Telephone Encounter (Signed)
I just got off the phone with the patient to ask him what is a good time or date for me to schedule his MRI at [facility] he informed me anytime day after 3pm. He also informed me that he is very claustrophobia and will need something to calm his nerves . He told me that he mailed a big yellow packet to our office and said that is all the medical records that he has and they are the original copies he said to make copies because he will need that packet back.

## 2016-06-10 ENCOUNTER — Telehealth: Payer: Self-pay | Admitting: *Deleted

## 2016-06-10 MED ORDER — VITAMIN D (ERGOCALCIFEROL) 1.25 MG (50000 UNIT) PO CAPS: 50000.0000 [IU] | ORAL_CAPSULE | ORAL | 0 refills | Status: DC

## 2016-06-10 NOTE — Telephone Encounter (Signed)
I have spoken with Joel Galloway this afternoon, and per RAS, explained that vitamin d level is low, needs rx. vit. d 50,000iu weekly for 12 weeks, then, once done with rx., he should resume otc vit. d 5,000iu daily.  He verbalized understanding of same.  Rx. escribed to [facility] per his request/fim

## 2016-06-10 NOTE — Telephone Encounter (Signed)
-----   Message from Britt Bottom, MD sent at 06/09/2016  7:34 PM EST ----- Please let him know Vit D is low    50000 U weekly x 12 weeks then 5000 U OTC daily
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
## 2016-06-16 ENCOUNTER — Telehealth: Payer: Self-pay | Admitting: Neurology

## 2016-06-16 NOTE — Telephone Encounter (Signed)
Pt called said he got confused on MRI appt, it was 1/27, he thought it was 2/3. Please call to r/s

## 2016-06-17 NOTE — Telephone Encounter (Signed)
I called the patient back and he informed me that he already rescheduled his appointment to [facility]

## 2016-06-20 ENCOUNTER — Other Ambulatory Visit (INDEPENDENT_AMBULATORY_CARE_PROVIDER_SITE_OTHER): Payer: Self-pay

## 2016-06-20 NOTE — Telephone Encounter (Signed)
I will call if something opens up sooner

## 2016-06-23 ENCOUNTER — Ambulatory Visit (INDEPENDENT_AMBULATORY_CARE_PROVIDER_SITE_OTHER): Payer: 59 | Admitting: Orthopaedic Surgery

## 2016-06-23 ENCOUNTER — Encounter (INDEPENDENT_AMBULATORY_CARE_PROVIDER_SITE_OTHER): Payer: Self-pay | Admitting: Orthopaedic Surgery

## 2016-06-23 ENCOUNTER — Ambulatory Visit (HOSPITAL_BASED_OUTPATIENT_CLINIC_OR_DEPARTMENT_OTHER)
Admission: RE | Admit: 2016-06-23 | Discharge: 2016-06-23 | Disposition: A | Discharge status: Home / Self Care | Payer: 59 | Source: Ambulatory Visit | Attending: Orthopaedic Surgery | Admitting: Orthopaedic Surgery

## 2016-06-23 DIAGNOSIS — M25572 Pain in left ankle and joints of left foot: Secondary | ICD-10-CM | POA: Diagnosis present

## 2016-06-23 DIAGNOSIS — R609 Edema, unspecified: Secondary | ICD-10-CM | POA: Insufficient documentation
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
## 2016-06-26 ENCOUNTER — Ambulatory Visit (INDEPENDENT_AMBULATORY_CARE_PROVIDER_SITE_OTHER): Payer: 59 | Admitting: Specialist

## 2016-07-01 ENCOUNTER — Ambulatory Visit (INDEPENDENT_AMBULATORY_CARE_PROVIDER_SITE_OTHER): Payer: Medicare Other | Admitting: Specialist

## 2016-07-08 ENCOUNTER — Ambulatory Visit (INDEPENDENT_AMBULATORY_CARE_PROVIDER_SITE_OTHER): Payer: 59 | Admitting: Neurology

## 2016-07-08 ENCOUNTER — Encounter: Payer: Self-pay | Admitting: Neurology

## 2016-07-08 VITALS — BP 141/79 | HR 106 | Resp 6 | Ht 73.0 in | Wt 180.5 lb

## 2016-07-08 DIAGNOSIS — T8484XA Pain due to internal orthopedic prosthetic devices, implants and grafts, initial encounter: Secondary | ICD-10-CM | POA: Insufficient documentation

## 2016-07-08 DIAGNOSIS — G43709 Chronic migraine without aura, not intractable, without status migrainosus: Secondary | ICD-10-CM | POA: Insufficient documentation

## 2016-07-08 DIAGNOSIS — R269 Unspecified abnormalities of gait and mobility: Secondary | ICD-10-CM

## 2016-07-08 DIAGNOSIS — R5383 Other fatigue: Secondary | ICD-10-CM | POA: Diagnosis not present

## 2016-07-08 DIAGNOSIS — R9089 Other abnormal findings on diagnostic imaging of central nervous system: Secondary | ICD-10-CM | POA: Insufficient documentation

## 2016-07-08 DIAGNOSIS — G894 Chronic pain syndrome: Secondary | ICD-10-CM

## 2016-07-08 MED ORDER — TOPIRAMATE 50 MG PO TABS: 50.0000 mg | ORAL_TABLET | Freq: Every day | ORAL | 11 refills | Status: DC

## 2016-07-08 NOTE — Progress Notes (Signed)
[facility]  PATIENT: Joel Galloway DOB: 15-Oct-1960  REFERRING DOCTOR OR PCP:  Referred by Tamsen Roers.   Also sees [name] SOURCE: patient, notes form Dr. Rodolph Bong, notes from Dr. Tomi Likens, MRI results  _________________________________   HISTORICAL  CHIEF COMPLAINT:  Chief Complaint  Patient presents with  . MRI results    Here today to discuss recent MRI results.  Denies new or worsening sx/fim    HISTORY OF PRESENT ILLNESS:  Joel Galloway is a 56 year old man who has been diagnosed with multiple sclerosis in the past.   I personally reviewed an MRI of the cervical spine that was performed  06/28/2016. It shows degenerative changes worse to the right at C5-C6 and degenerative changes worse to the left at C6-C7 where there could be compression of the C7 nerve root. Spinal cord appears normal.   I also reviewed an MRI of the brain to 06/28/2016.   It shows scattered T2/FLAIR hyperintense foci predominantly in the subcortical white matter of both hemispheres. Foci are not in the corpus callosum. The brainstem appears normal.  There is a normal enhancement pattern.  Based on the MRIs, I think multiple cirrhosis is very unlikely.  Migraine:   He reports migraine headaches 28-30 days a month and they occur for most of the day.   He takes Tylenol prn and is on Morphine 60 mg po bid and either tizanidiine or soma.    Chronic back pain: He is currently on morphine 60 mg twice a day and Valium 3 times a day. I discussed with him that those medications can affect his ability to drive and likely playing a role in his sleepiness he tried to reduce his dose further when he goes back to see his doctor.  Gait/strength/sensation:  He feels he is walking about the same as the last visit.  He reports poor balance and leg weakness.      He reports numbness in both feet.     Bladder:    He reports urinary frequency and urgency. He also has had urinary hesitancy and is on  tamsulosin.    He has occasional urinary incontinence.     Vision:   He feels vision is reduced, right eye worse than left.     Mood/Cognition:   He notes depression and anxiety.Marland Kitchen  He notes difficulty with short term memory,   He often misplaces items and has poor focus and attention.   He sometimes has trouble with word finding.     Fatigue/Sleep:   He reports daily fatigue that worsens as the day goes on and with heat.Marland Kitchen    He tries to stay cool.   He has difficulty with sleep, both sleep onset and sleep maintenance.    He does not snore.     History of neurologic symptoms and evaluation:   He presented with right optic neuritis in 2003 or 2004.    The next year, he states he  had right arm and leg weakness and clumsiness.  He saw Dr. Tressie Ellis that the [place] [facility] in [place] and was diagnosed with MS. He reports having an LP in the past and thinks one was done in [place] and one in [place] but not sure exactly where (we don't have any results from any of his MS evaluations).   He took Avonex x 3 years but felt terrible.   He tried a second interferon x 2-3 more years but also felt terrible. He was last on an MS  DMT about 5 years ago.   About 2014, he had a left foot drop and received steroids.  He saw Dr. Tomi Likens in 2014 and an LP was ordered but he did not have it done.    He reports in November 2017 he had a right foot drop . He reports being placed on oral steroids.     On 04/11/16 and 05/09/2016, he presented to [facility] with altered mental status that was felt to probably be due to polysubstance abuse.    I reviewed those admissions.   He saw Psychiatry the second one.     REVIEW OF SYSTEMS: Constitutional: No fevers, chills, sweats, or change in appetite.  He reports fatigue and insomnia Eyes: Notes blurry vision.   Ear, nose and throat: No hearing loss, ear pain, nasal congestion, sore throat Cardiovascular: No chest pain, palpitations Respiratory: No shortness of breath  at rest or with exertion.   No wheezes GastrointestinaI: No nausea, vomiting, diarrhea, abdominal pain, fecal incontinence Genitourinary: No dysuria, urinary retention or frequency.  No nocturia. Musculoskeletal: Notes back pain Integumentary: No rash, pruritus, skin lesions Neurological: as above Psychiatric: Notes depression and anxiety Endocrine: No palpitations, diaphoresis, change in appetite, change in weigh or increased thirst Hematologic/Lymphatic: No anemia, purpura, petechiae. Allergic/Immunologic: No itchy/runny eyes, nasal congestion, recent allergic reactions, rashes  ALLERGIES: Allergies  Allergen Reactions  . Antihistamines, Chlorpheniramine-Type Other (See Comments)    Chest tightness  . Lyrica [Pregabalin] Palpitations  . Other Anaphylaxis    nuts  . Neosporin [Neomycin-Bacitracin Zn-Polymyx] Swelling  . Aspirin Other (See Comments)    Unknown  . Benadryl [Diphenhydramine] Other (See Comments)    Unknown  . Benztropine Other (See Comments)    Unknown  . Protein Other (See Comments)    Unknown  . Gabapentin Palpitations  . Propranolol Nausea Only    HOME MEDICATIONS:  Current Outpatient Prescriptions:  .  acetaminophen (TYLENOL) 500 MG tablet, Take 500 mg by mouth every 6 (six) hours as needed., Disp: , Rfl:  .  albuterol (PROVENTIL HFA;VENTOLIN HFA) 108 (90 BASE) MCG/ACT inhaler, Inhale 2 puffs into the lungs every 6 (six) hours as needed for wheezing., Disp: , Rfl:  .  cyclobenzaprine (FLEXERIL) 10 MG tablet, Take 10 mg by mouth 4 (four) times daily., Disp: , Rfl:  .  diazepam (VALIUM) 10 MG tablet, Take 10 mg by mouth 3 (three) times daily as needed for anxiety. , Disp: , Rfl:  .  diclofenac sodium (VOLTAREN) 1 % GEL, Apply 1 application topically as needed (for pain)., Disp: , Rfl:  .  doxepin (SINEQUAN) 10 MG capsule, Take 1 capsule (10 mg total) by mouth daily., Disp: 30 capsule, Rfl: 5 .  famotidine (PEPCID) 40 MG tablet, Take 40 mg by mouth daily. ,  Disp: , Rfl:  .  morphine (MS CONTIN) 60 MG 12 hr tablet, Take 60 mg by mouth 2 (two) times daily. , Disp: , Rfl: 0 .  NIFEdipine (PROCARDIA-XL/ADALAT-CC/NIFEDICAL-XL) 30 MG 24 hr tablet, Take 30 mg by mouth daily., Disp: , Rfl:  .  tamsulosin (FLOMAX) 0.4 MG CAPS capsule, Take 0.4 mg by mouth daily., Disp: , Rfl:  .  testosterone cypionate (DEPOTESTOSTERONE CYPIONATE) 200 MG/ML injection, , Disp: , Rfl: 0 .  tiZANidine (ZANAFLEX) 4 MG tablet, Take one or two pills at bedtime, Disp: 60 tablet, Rfl: 3 .  traMADol (ULTRAM) 50 MG tablet, Take 50 mg by mouth 3 (three) times daily as needed., Disp: , Rfl:  .  Vitamin D, Ergocalciferol, (DRISDOL) 50000 units CAPS capsule, Take 1 capsule (50,000 Units total) by mouth every 7 (seven) days., Disp: 12 capsule, Rfl: 0 .  carisoprodol (SOMA) 350 MG tablet, Take 1 tablet (350 mg total) by mouth 4 (four) times daily as needed for muscle spasms. (Patient not taking: Reported on 06/02/2016), Disp: 120 tablet, Rfl: 0 .  topiramate (TOPAMAX) 50 MG tablet, Take 1 tablet (50 mg total) by mouth at bedtime., Disp: 30 tablet, Rfl: 11  PAST MEDICAL HISTORY: Past Medical History:  Diagnosis Date  . Anxiety   . Arthritis   . Asthma   . Cancer ([place])    skin   . Chronic kidney disease   . Coronary artery disease   . Headache    pt reports migraines  . Hypoglycemia    las t episode 12/24/12  . Multiple sclerosis ([place])   . Seasonal allergies   . Seizures ([place])   . Sleep apnea    Pt reports "mild sleep apnea"  . Vision abnormalities     PAST SURGICAL HISTORY: Past Surgical History:  Procedure Laterality Date  . BACK SURGERY    . HEMATOMA EVACUATION Right 07/13/2014   Procedure: EVACUATION HEMATOMA RIGHT MIDDLE FINGER;  Surgeon: Mcarthur Rossetti, MD;  Location: [facility];  Service: Orthopedics;  Laterality: Right;  . KNEE SURGERY Left    x2  arthroscopy  . NOSE SURGERY    . POSTERIOR FUSION LUMBAR SPINE  01/10/2013   Dr Louanne Skye  . WISDOM TOOTH EXTRACTION        FAMILY HISTORY: Family History  Problem Relation Age of Onset  . Hypertension Mother   . Obesity Mother   . Diabetes Mother   . Anxiety disorder Mother   . Ataxia Neg Hx   . Chorea Neg Hx   . Dementia Neg Hx   . Mental retardation Neg Hx   . Migraines Neg Hx   . Multiple sclerosis Neg Hx   . Neurofibromatosis Neg Hx   . Neuropathy Neg Hx   . Parkinsonism Neg Hx   . Seizures Neg Hx   . Stroke Neg Hx     SOCIAL HISTORY:  Social History   Social History  . Marital status: Married    Spouse name: N/A  . Number of children: N/A  . Years of education: N/A   Occupational History  . Not on file.   Social History Main Topics  . Smoking status: Never Smoker  . Smokeless tobacco: Never Used  . Alcohol use No  . Drug use: No  . Sexual activity: Not on file   Other Topics Concern  . Not on file   Social History Narrative  . No narrative on file     PHYSICAL EXAM  Vitals:   07/08/16 1000  BP: (!) 141/79  Pulse: (!) 106  Resp: (!) 6  Weight: 180 lb 8 oz (81.9 kg)  Height: 6\' 1"  (1.854 m)    Body mass index is 23.81 kg/m.   General: The patient is well-developed and well-nourished and in no acute distress  Eyes:  Funduscopic exam shows normal optic discs and retinal vessels.  Neck: The neck is supple, no carotid bruits are noted.  The neck is nontender.  Cardiovascular: The heart has a regular rate and rhythm with a normal S1 and S2. There were no murmurs, gallops or rubs. Lungs are clear to auscultation.  Skin: Extremities are without significant edema.  Musculoskeletal:  Back is nontender  Neurologic Exam  Mental status: The patient is alert and oriented x 3 at the time of the examination. The patient has apparent normal recent and remote memory, with an apparently normal attention span and concentration ability.   Speech is normal.  Cranial nerves: Extraocular movements are full.   Facial sensation is symmetric to touch.  .Facial strength is  normal.  Trapezius and sternocleidomastoid strength is normal. No dysarthria is noted.  The tongue is midline, and the patient has symmetric elevation of the soft palate. No obvious hearing deficits are noted.  Motor:  Muscle bulk is normal.   Tone is normal. Strength is  5 / 5 in all 4 extremities.   Sensory: He reports normal arm soft touch and vibration sensation.  In the right foot, he reports less sensation in the S1 sensation to touch.    Coordination: Cerebellar testing reveals good finger-nose-finger and heel-to-shin bilaterally.  Gait and station: Station is normal.   Gait is mildly wide and no left foot drop today. Tandem gait is mildly wide.    Reflexes: Deep tendon reflexes are symmetric and normal in arms and reduced in right ankle.   Plantar responses are flexor.    DIAGNOSTIC DATA (LABS, IMAGING, TESTING) - I reviewed patient records, labs, notes, testing and imaging myself where available.  Lab Results  Component Value Date   WBC 9.4 05/17/2016   HGB 10.1 (L) 05/17/2016   HCT 29.4 (L) 05/17/2016   MCV 92.2 05/17/2016   PLT 320 05/17/2016      Component Value Date/Time   NA 140 05/17/2016 0858   K 2.7 (LL) 05/17/2016 0858   CL 109 05/17/2016 0858   CO2 24 05/17/2016 0858   GLUCOSE 107 (H) 05/17/2016 0858   BUN 11 05/17/2016 0858   CREATININE 0.76 05/17/2016 0858   CALCIUM 8.5 (L) 05/17/2016 0858   PROT 5.9 (L) 05/17/2016 0858   ALBUMIN 3.3 (L) 05/17/2016 0858   AST 19 05/17/2016 0858   ALT 70 (H) 05/17/2016 0858   ALKPHOS 52 05/17/2016 0858   BILITOT 0.4 05/17/2016 0858   GFRNONAA >60 05/17/2016 0858   GFRAA >60 05/17/2016 0858       ASSESSMENT AND PLAN  Gait disturbance  Abnormal brain MRI  Chronic pain syndrome  Other fatigue  Chronic migraine w/o aura w/o status migrainosus, not intractable    1.   MRI of the brain and spinal cord were reviewed. The changes of the brain appeared to be more consistent with chronic microvascular ischemic  change or sequela of migraine.    MS is unlikely.    2.   topiramate for migraines 3.   rtc one year  Richard A. Felecia Shelling, MD, PhD [id number], [id number] AM Certified in Neurology, Clinical Neurophysiology, Sleep Medicine, Pain Medicine and Neuroimaging  [facility] [address], [place], [place] [address] [phone]

## 2016-07-10 ENCOUNTER — Telehealth: Payer: Self-pay | Admitting: Neurology

## 2016-07-10 NOTE — Telephone Encounter (Signed)
I have spoken with pt. this afternoon.  Per RAS, he is not able to add another sedating med to pt's regimen/fim

## 2016-07-10 NOTE — Telephone Encounter (Signed)
Pt says topiramate (TOPAMAX) 50 MG tablet has not helped at all, no sleep. Pt said he needs something for sleep. Pt says he has the flu now. He is not taking pain medication or diazepam (VALIUM) 10 MG tablet as directed. Please call

## 2016-07-11 ENCOUNTER — Encounter (HOSPITAL_BASED_OUTPATIENT_CLINIC_OR_DEPARTMENT_OTHER): Payer: Self-pay | Admitting: *Deleted

## 2016-07-11 ENCOUNTER — Emergency Department (HOSPITAL_BASED_OUTPATIENT_CLINIC_OR_DEPARTMENT_OTHER): Payer: 59

## 2016-07-11 ENCOUNTER — Observation Stay (HOSPITAL_BASED_OUTPATIENT_CLINIC_OR_DEPARTMENT_OTHER)
Admission: EM | Admit: 2016-07-11 | Discharge: 2016-07-13 | Disposition: A | Discharge status: Home / Self Care | Payer: 59 | Attending: Internal Medicine | Admitting: Internal Medicine

## 2016-07-11 DIAGNOSIS — D729 Disorder of white blood cells, unspecified: Secondary | ICD-10-CM

## 2016-07-11 DIAGNOSIS — K219 Gastro-esophageal reflux disease without esophagitis: Secondary | ICD-10-CM | POA: Insufficient documentation

## 2016-07-11 DIAGNOSIS — Z833 Family history of diabetes mellitus: Secondary | ICD-10-CM | POA: Diagnosis not present

## 2016-07-11 DIAGNOSIS — T8484XA Pain due to internal orthopedic prosthetic devices, implants and grafts, initial encounter: Secondary | ICD-10-CM | POA: Diagnosis present

## 2016-07-11 DIAGNOSIS — R000 Tachycardia, unspecified: Secondary | ICD-10-CM | POA: Diagnosis present

## 2016-07-11 DIAGNOSIS — R0602 Shortness of breath: Secondary | ICD-10-CM

## 2016-07-11 DIAGNOSIS — M1990 Unspecified osteoarthritis, unspecified site: Secondary | ICD-10-CM | POA: Diagnosis not present

## 2016-07-11 DIAGNOSIS — I2510 Atherosclerotic heart disease of native coronary artery without angina pectoris: Secondary | ICD-10-CM | POA: Diagnosis not present

## 2016-07-11 DIAGNOSIS — E872 Acidosis, unspecified: Secondary | ICD-10-CM | POA: Diagnosis present

## 2016-07-11 DIAGNOSIS — I129 Hypertensive chronic kidney disease with stage 1 through stage 4 chronic kidney disease, or unspecified chronic kidney disease: Secondary | ICD-10-CM | POA: Diagnosis not present

## 2016-07-11 DIAGNOSIS — Z8781 Personal history of (healed) traumatic fracture: Secondary | ICD-10-CM | POA: Insufficient documentation

## 2016-07-11 DIAGNOSIS — G43909 Migraine, unspecified, not intractable, without status migrainosus: Secondary | ICD-10-CM | POA: Insufficient documentation

## 2016-07-11 DIAGNOSIS — N189 Chronic kidney disease, unspecified: Secondary | ICD-10-CM | POA: Diagnosis not present

## 2016-07-11 DIAGNOSIS — Z981 Arthrodesis status: Secondary | ICD-10-CM | POA: Insufficient documentation

## 2016-07-11 DIAGNOSIS — G35 Multiple sclerosis: Secondary | ICD-10-CM | POA: Insufficient documentation

## 2016-07-11 DIAGNOSIS — F1910 Other psychoactive substance abuse, uncomplicated: Secondary | ICD-10-CM | POA: Diagnosis not present

## 2016-07-11 DIAGNOSIS — G4730 Sleep apnea, unspecified: Secondary | ICD-10-CM | POA: Diagnosis not present

## 2016-07-11 DIAGNOSIS — N400 Enlarged prostate without lower urinary tract symptoms: Secondary | ICD-10-CM | POA: Insufficient documentation

## 2016-07-11 DIAGNOSIS — G894 Chronic pain syndrome: Secondary | ICD-10-CM | POA: Diagnosis not present

## 2016-07-11 DIAGNOSIS — J45909 Unspecified asthma, uncomplicated: Secondary | ICD-10-CM | POA: Insufficient documentation

## 2016-07-11 DIAGNOSIS — R6510 Systemic inflammatory response syndrome (SIRS) of non-infectious origin without acute organ dysfunction: Principal | ICD-10-CM | POA: Insufficient documentation

## 2016-07-11 DIAGNOSIS — F411 Generalized anxiety disorder: Secondary | ICD-10-CM | POA: Diagnosis not present

## 2016-07-11 DIAGNOSIS — R079 Chest pain, unspecified: Secondary | ICD-10-CM | POA: Diagnosis present

## 2016-07-11 DIAGNOSIS — Z8249 Family history of ischemic heart disease and other diseases of the circulatory system: Secondary | ICD-10-CM | POA: Diagnosis not present

## 2016-07-11 LAB — URINALYSIS, MICROSCOPIC (REFLEX): WBC UA: NONE SEEN WBC/hpf (ref 0–5)

## 2016-07-11 LAB — BASIC METABOLIC PANEL
ANION GAP: 8 (ref 5–15)
BUN: 24 mg/dL — ABNORMAL HIGH (ref 6–20)
CALCIUM: 9.1 mg/dL (ref 8.9–10.3)
CO2: 22 mmol/L (ref 22–32)
Chloride: 109 mmol/L (ref 101–111)
Creatinine, Ser: 0.73 mg/dL (ref 0.61–1.24)
GFR calc non Af Amer: >60 mL/min (ref >60)
Glucose, Bld: 145 mg/dL — ABNORMAL HIGH (ref 65–99)
POTASSIUM: 3.9 mmol/L (ref 3.5–5.1)
Sodium: 139 mmol/L (ref 135–145)

## 2016-07-11 LAB — URINALYSIS, ROUTINE W REFLEX MICROSCOPIC
Bilirubin Urine: NEGATIVE
GLUCOSE, UA: NEGATIVE mg/dL
KETONES UR: NEGATIVE mg/dL
LEUKOCYTES UA: NEGATIVE
NITRITE: NEGATIVE
PH: 6 (ref 5.0–8.0)
PROTEIN: NEGATIVE mg/dL
Specific Gravity, Urine: 1.008 (ref 1.005–1.030)

## 2016-07-11 LAB — CBC WITH DIFFERENTIAL/PLATELET
BASOS PCT: 0 %
Basophils Absolute: 0 10*3/uL (ref 0.0–0.1)
EOS PCT: 0 %
Eosinophils Absolute: 0 10*3/uL (ref 0.0–0.7)
HCT: 42.6 % (ref 39.0–52.0)
HEMOGLOBIN: 14.9 g/dL (ref 13.0–17.0)
LYMPHS PCT: 14 %
Lymphs Abs: 2.5 10*3/uL (ref 0.7–4.0)
MCH: 31.9 pg (ref 26.0–34.0)
MCHC: 35 g/dL (ref 30.0–36.0)
MCV: 91.2 fL (ref 78.0–100.0)
MONOS PCT: 8 %
Monocytes Absolute: 1.5 10*3/uL — ABNORMAL HIGH (ref 0.1–1.0)
NEUTROS PCT: 78 %
Neutro Abs: 14.2 10*3/uL — ABNORMAL HIGH (ref 1.7–7.7)
Platelets: 465 10*3/uL — ABNORMAL HIGH (ref 150–400)
RBC: 4.67 MIL/uL (ref 4.22–5.81)
RDW: 13.2 % (ref 11.5–15.5)
WBC: 18.2 10*3/uL — AB (ref 4.0–10.5)

## 2016-07-11 LAB — TROPONIN I: Troponin I: <0.03 ng/mL (ref <0.03)

## 2016-07-11 MED ORDER — IPRATROPIUM-ALBUTEROL 0.5-2.5 (3) MG/3ML IN SOLN
3.0000 mL | Freq: Four times a day (QID) | RESPIRATORY_TRACT | Status: DC
  Administered 2016-07-12: 3 mL via RESPIRATORY_TRACT
  Filled 2016-07-11 (×2): qty 3

## 2016-07-11 NOTE — ED Triage Notes (Signed)
Pt with sudden onset of SHOB and chest pain x 1 hour ago reports left arm pain ans well. Reports heart racing x 1 week

## 2016-07-11 NOTE — ED Provider Notes (Signed)
[facility] Provider Note: Joel Spurling, MD, FACEP  CSN: [id number] MRN: [id number] ARRIVAL: 07/11/16 at 2120 ROOM: MH07/MH07   By signing my name below, I, Joel Galloway, attest that this documentation has been prepared under the direction and in the presence of Joel Rosser, MD . Electronically Signed: Dyke Galloway, Scribe. 07/11/2016. 11:48 PM.   CHIEF COMPLAINT  Shortness of Breath  HISTORY OF PRESENT ILLNESS  Joel Galloway is a 56 y.o. male with a history of Multiple Sclerosis who presents to the Emergency Department complaining of gradually worsening, persistent shortness of breath onset eight days ago. He feels as if he is unable to breathe in normally. Pt reports associated rapid heartbeat and left sided chest pain. He describes his chest pain as sharp and states it is exacerbated by deep inspiration or cough. Pt also reports intermittent sneezing, nosebleeds, and cough which began five days ago. He was seen at his PCP two days ago and was prescribed Tamiflu and abx which he has taken with no significant relief of symptoms. Pt feels as if he has had a general decline in health since major trauma November 2017 with a 30 pound weight loss. He denies fever, vomiting, or diarrhea. He was recently seen by Dr. Ninfa Linden for a left foot and ankle injury.  Past Medical History:  Diagnosis Date  . Anxiety   . Arthritis   . Asthma   . Cancer ([place])    skin   . Chronic kidney disease   . Coronary artery disease   . Headache    pt reports migraines  . Hypoglycemia    las t episode 12/24/12  . Multiple sclerosis ([place])   . Seasonal allergies   . Seizures ([place])   . Sleep apnea    Pt reports "mild sleep apnea"  . Vision abnormalities     Past Surgical History:  Procedure Laterality Date  . BACK SURGERY    . HEMATOMA EVACUATION Right 07/13/2014   Procedure: EVACUATION HEMATOMA RIGHT MIDDLE FINGER;  Surgeon: Mcarthur Rossetti, MD;  Location: [facility];  Service:  Orthopedics;  Laterality: Right;  . KNEE SURGERY Left    x2  arthroscopy  . NOSE SURGERY    . POSTERIOR FUSION LUMBAR SPINE  01/10/2013   Dr Louanne Skye  . WISDOM TOOTH EXTRACTION      Family History  Problem Relation Age of Onset  . Hypertension Mother   . Obesity Mother   . Diabetes Mother   . Anxiety disorder Mother   . Ataxia Neg Hx   . Chorea Neg Hx   . Dementia Neg Hx   . Mental retardation Neg Hx   . Migraines Neg Hx   . Multiple sclerosis Neg Hx   . Neurofibromatosis Neg Hx   . Neuropathy Neg Hx   . Parkinsonism Neg Hx   . Seizures Neg Hx   . Stroke Neg Hx     Social History  Substance Use Topics  . Smoking status: Never Smoker  . Smokeless tobacco: Never Used  . Alcohol use No    Prior to Admission medications   Medication Sig Start Date End Date Taking? Authorizing Provider  acetaminophen (TYLENOL) 500 MG tablet Take 500 mg by mouth every 6 (six) hours as needed.    Historical Provider, MD  albuterol (PROVENTIL HFA;VENTOLIN HFA) 108 (90 BASE) MCG/ACT inhaler Inhale 2 puffs into the lungs every 6 (six) hours as needed for wheezing.    Historical Provider, MD  cyclobenzaprine (FLEXERIL) 10  MG tablet Take 10 mg by mouth 4 (four) times daily.    Historical Provider, MD  diazepam (VALIUM) 10 MG tablet Take 10 mg by mouth 3 (three) times daily as needed for anxiety.     Historical Provider, MD  diclofenac sodium (VOLTAREN) 1 % GEL Apply 1 application topically as needed (for pain).    Historical Provider, MD  famotidine (PEPCID) 40 MG tablet Take 40 mg by mouth daily.     Historical Provider, MD  NIFEdipine (PROCARDIA-XL/ADALAT-CC/NIFEDICAL-XL) 30 MG 24 hr tablet Take 30 mg by mouth daily.    Historical Provider, MD  tamsulosin (FLOMAX) 0.4 MG CAPS capsule Take 0.4 mg by mouth daily.    Historical Provider, MD  testosterone cypionate (DEPOTESTOSTERONE CYPIONATE) 200 MG/ML injection  05/29/16   Historical Provider, MD  topiramate (TOPAMAX) 50 MG tablet Take 1 tablet (50 mg  total) by mouth at bedtime. 07/08/16   Britt Bottom, MD  traMADol (ULTRAM) 50 MG tablet Take 50 mg by mouth 3 (three) times daily as needed.    Historical Provider, MD  Vitamin D, Ergocalciferol, (DRISDOL) 50000 units CAPS capsule Take 1 capsule (50,000 Units total) by mouth every 7 (seven) days. 06/10/16   Britt Bottom, MD    Allergies Antihistamines, chlorpheniramine-type; Lyrica [pregabalin]; Other; Neosporin [neomycin-bacitracin zn-polymyx]; Aspirin; Benadryl [diphenhydramine]; Benztropine; Protein; Gabapentin; and Propranolol   REVIEW OF SYSTEMS  Negative except as noted here or in the History of Present Illness.  PHYSICAL EXAMINATION  Initial Vital Signs Blood pressure 113/81, pulse 114, temperature 98 F (36.7 C), temperature source Oral, resp. rate 18, height 6\' 1"  (1.854 m), weight 180 lb (81.6 kg), SpO2 100 %.  Examination General: Well-developed, well-nourished male in no acute distress; appearance consistent with age of record HENT: normocephalic; atraumatic Eyes: pupils equal, round and reactive to light; extraocular muscles intact Neck: supple Heart: regular rate and rhythm; tachycardia Lungs: Diminished on the right, primarily at the apex Abdomen: soft; nondistended; nontender; no masses or hepatosplenomegaly; bowel sounds present Extremities: Full range of motion; pulses normal; swelling and tenderness of the left ankle with ecchymosis of the left foot.  Neurologic: Awake, alert and oriented; motor function intact in all extremities and symmetric; no facial droop Skin: Warm and dry Psychiatric: Normal mood and affect  RESULTS  Summary of this visit's results, reviewed by myself:   EKG Interpretation  Date/Time:  Friday July 11 2016 21:30:27 EST Ventricular Rate:  127 PR Interval:  130 QRS Duration: 90 QT Interval:  292 QTC Calculation: 424 R Axis:   84 Text Interpretation:  Sinus tachycardia Otherwise normal ECG agree. no sig change from old Confirmed  by Johnney Killian, MD, Jeannie Done [phone]) on 07/11/2016 10:12:22 PM      Laboratory Studies: Results for orders placed or performed during the hospital encounter of 07/11/16 (from the past 24 hour(s))  Troponin I     Status: None   Collection Time: 07/11/16  9:35 PM  Result Value Ref Range   Troponin I <0.03 <0.03 ng/mL  CBC with Differential     Status: Abnormal   Collection Time: 07/11/16  9:35 PM  Result Value Ref Range   WBC 18.2 (H) 4.0 - 10.5 K/uL   RBC 4.67 4.22 - 5.81 MIL/uL   Hemoglobin 14.9 13.0 - 17.0 g/dL   HCT 42.6 39.0 - 52.0 %   MCV 91.2 78.0 - 100.0 fL   MCH 31.9 26.0 - 34.0 pg   MCHC 35.0 30.0 - 36.0 g/dL   RDW 13.2 11.5 -  15.5 %   Platelets 465 (H) 150 - 400 K/uL   Neutrophils Relative % 78 %   Lymphocytes Relative 14 %   Monocytes Relative 8 %   Eosinophils Relative 0 %   Basophils Relative 0 %   Neutro Abs 14.2 (H) 1.7 - 7.7 K/uL   Lymphs Abs 2.5 0.7 - 4.0 K/uL   Monocytes Absolute 1.5 (H) 0.1 - 1.0 K/uL   Eosinophils Absolute 0.0 0.0 - 0.7 K/uL   Basophils Absolute 0.0 0.0 - 0.1 K/uL   WBC Morphology MILD LEFT SHIFT (1-5% METAS, OCC MYELO, OCC BANDS)    Smear Review PLATELET COUNT CONFIRMED BY SMEAR   Basic metabolic panel     Status: Abnormal   Collection Time: 07/11/16  9:35 PM  Result Value Ref Range   Sodium 139 135 - 145 mmol/L   Potassium 3.9 3.5 - 5.1 mmol/L   Chloride 109 101 - 111 mmol/L   CO2 22 22 - 32 mmol/L   Glucose, Bld 145 (H) 65 - 99 mg/dL   BUN 24 (H) 6 - 20 mg/dL   Creatinine, Ser 0.73 0.61 - 1.24 mg/dL   Calcium 9.1 8.9 - 10.3 mg/dL   GFR calc non Af Amer >60 >60 mL/min   GFR calc Af Amer >60 >60 mL/min   Anion gap 8 5 - 15  Urinalysis, Routine w reflex microscopic     Status: Abnormal   Collection Time: 07/11/16  9:39 PM  Result Value Ref Range   Color, Urine YELLOW YELLOW   APPearance CLEAR CLEAR   Specific Gravity, Urine 1.008 1.005 - 1.030   pH 6.0 5.0 - 8.0   Glucose, UA NEGATIVE NEGATIVE mg/dL   Hgb urine dipstick TRACE (A)  NEGATIVE   Bilirubin Urine NEGATIVE NEGATIVE   Ketones, ur NEGATIVE NEGATIVE mg/dL   Protein, ur NEGATIVE NEGATIVE mg/dL   Nitrite NEGATIVE NEGATIVE   Leukocytes, UA NEGATIVE NEGATIVE  Urinalysis, Microscopic (reflex)     Status: Abnormal   Collection Time: 07/11/16  9:39 PM  Result Value Ref Range   RBC / HPF 0-5 0 - 5 RBC/hpf   WBC, UA NONE SEEN 0 - 5 WBC/hpf   Bacteria, UA RARE (A) NONE SEEN   Squamous Epithelial / LPF 0-5 (A) NONE SEEN  Troponin I     Status: None   Collection Time: 07/12/16 12:56 AM  Result Value Ref Range   Troponin I <0.03 <0.03 ng/mL  I-Stat CG4 Lactic Acid, ED     Status: Abnormal   Collection Time: 07/12/16  1:14 AM  Result Value Ref Range   Lactic Acid, Venous 1.97 (HH) 0.5 - 1.9 mmol/L  I-Stat CG4 Lactic Acid, ED     Status: Abnormal   Collection Time: 07/12/16  4:03 AM  Result Value Ref Range   Lactic Acid, Venous 2.10 (HH) 0.5 - 1.9 mmol/L   Comment NOTIFIED PHYSICIAN    Imaging Studies: Dg Chest 2 View  Result Date: 07/11/2016 CLINICAL DATA:  Initial evaluation for acute onset shortness of breath. EXAM: CHEST  2 VIEW COMPARISON:  Prior radiograph from 05/26/2016. FINDINGS: The cardiac and mediastinal silhouettes are stable in size and contour, and remain within normal limits. The lungs are normally inflated. No airspace consolidation, pleural effusion, or pulmonary edema is identified. There is no pneumothorax. No acute osseous abnormality identified. IMPRESSION: No active cardiopulmonary disease. Electronically Signed   By: Jeannine Boga M.D.   On: 07/11/2016 21:51   Ct Angio Chest Pe W Or Wo  Contrast  Result Date: 07/12/2016 CLINICAL DATA:  Persistent shortness of breath EXAM: CT ANGIOGRAPHY CHEST WITH CONTRAST TECHNIQUE: Multidetector CT imaging of the chest was performed using the standard protocol during bolus administration of intravenous contrast. Multiplanar CT image reconstructions and MIPs were obtained to evaluate the vascular  anatomy. CONTRAST:  100 mL Isovue 370 intravenous COMPARISON:  Chest x-ray 07/11/2016 FINDINGS: Cardiovascular: Satisfactory opacification of the pulmonary arteries to the segmental level. No evidence of pulmonary embolism. Non aneurysmal aorta. No dissection. Minimal coronary artery calcification. Normal heart size. No pericardial effusion. Mediastinum/Nodes: No enlarged mediastinal, hilar, or axillary lymph nodes. Thyroid gland, trachea, and esophagus demonstrate no significant findings. Lungs/Pleura: Mild emphysema in the upper lobes. No acute consolidation or effusion. No pneumothorax Upper Abdomen: No acute abnormality Musculoskeletal: Healing rib fractures on the right involving right, sixth, seventh, eighth, ninth ribs. Irregular oblique lucency through the lower sternum with mild periosteal change, consistent with healing sternal fracture. Review of the MIP images confirms the above findings. IMPRESSION: 1. The examination is negative for acute pulmonary embolus or aortic dissection 2. Mild emphysema in the upper lobes 3. Multiple healing right rib fractures 4. Healing lower sternal fracture. Electronically Signed   By: Donavan Foil M.D.   On: 07/12/2016 01:42    ED COURSE  Nursing notes and initial vitals signs, including pulse oximetry, reviewed.  Vitals:   07/12/16 0030 07/12/16 0100 07/12/16 0101 07/12/16 0359  BP: 117/71 138/86    Pulse: 98 112    Resp: 15 20    Temp:   97.5 F (36.4 C) 98.4 F (36.9 C)  TempSrc:   Oral Oral  SpO2: 100% 100%    Weight:      Height:       12:46 AM Patient feeling better after DuoNeb treatment but breath sounds are still diminished in the right apex and he continues to be tachycardic.   4:30 AM Patient ambulated around department. His heart rate went up to 170 and his oxygen saturations dropped to 91%. The etiology of this is unclear but will have him admitted.  PROCEDURES    ED DIAGNOSES     ICD-9-CM ICD-10-CM   1. Shortness of breath  786.05 R06.02   2. Sinus tachycardia 427.89 R00.0   3. Neutrophilic leukocytosis XX123456 D72.9     I personally performed the services described in this documentation, which was scribed in my presence. The recorded information has been reviewed and is accurate.    Joel Rosser, MD 07/12/16 [phone]

## 2016-07-12 ENCOUNTER — Emergency Department (HOSPITAL_BASED_OUTPATIENT_CLINIC_OR_DEPARTMENT_OTHER): Payer: 59

## 2016-07-12 DIAGNOSIS — F1910 Other psychoactive substance abuse, uncomplicated: Secondary | ICD-10-CM

## 2016-07-12 DIAGNOSIS — R6510 Systemic inflammatory response syndrome (SIRS) of non-infectious origin without acute organ dysfunction: Secondary | ICD-10-CM | POA: Diagnosis present

## 2016-07-12 DIAGNOSIS — E872 Acidosis, unspecified: Secondary | ICD-10-CM | POA: Diagnosis present

## 2016-07-12 DIAGNOSIS — F411 Generalized anxiety disorder: Secondary | ICD-10-CM

## 2016-07-12 DIAGNOSIS — R000 Tachycardia, unspecified: Secondary | ICD-10-CM | POA: Diagnosis not present

## 2016-07-12 DIAGNOSIS — G894 Chronic pain syndrome: Secondary | ICD-10-CM

## 2016-07-12 DIAGNOSIS — R079 Chest pain, unspecified: Secondary | ICD-10-CM | POA: Diagnosis not present

## 2016-07-12 LAB — TROPONIN I
Troponin I: <0.03 ng/mL (ref <0.03)
Troponin I: <0.03 ng/mL (ref <0.03)

## 2016-07-12 LAB — COMPREHENSIVE METABOLIC PANEL
ALT: 24 U/L (ref 17–63)
AST: 16 U/L (ref 15–41)
Albumin: 3.8 g/dL (ref 3.5–5.0)
Alkaline Phosphatase: 51 U/L (ref 38–126)
Anion gap: 9 (ref 5–15)
BILIRUBIN TOTAL: 0.8 mg/dL (ref 0.3–1.2)
BUN: 17 mg/dL (ref 6–20)
CALCIUM: 8.4 mg/dL — AB (ref 8.9–10.3)
CO2: 22 mmol/L (ref 22–32)
CREATININE: 0.68 mg/dL (ref 0.61–1.24)
Chloride: 109 mmol/L (ref 101–111)
GFR calc Af Amer: >60 mL/min (ref >60)
Glucose, Bld: 123 mg/dL — ABNORMAL HIGH (ref 65–99)
POTASSIUM: 3.3 mmol/L — AB (ref 3.5–5.1)
Sodium: 140 mmol/L (ref 135–145)
TOTAL PROTEIN: 6 g/dL — AB (ref 6.5–8.1)

## 2016-07-12 LAB — CBC WITH DIFFERENTIAL/PLATELET
BASOS ABS: 0 10*3/uL (ref 0.0–0.1)
Basophils Relative: 0 %
Eosinophils Absolute: 0 10*3/uL (ref 0.0–0.7)
Eosinophils Relative: 0 %
HEMATOCRIT: 40.6 % (ref 39.0–52.0)
Hemoglobin: 13.9 g/dL (ref 13.0–17.0)
LYMPHS PCT: 15 %
Lymphs Abs: 1.8 10*3/uL (ref 0.7–4.0)
MCH: 31 pg (ref 26.0–34.0)
MCHC: 34.2 g/dL (ref 30.0–36.0)
MCV: 90.6 fL (ref 78.0–100.0)
MONO ABS: 0.9 10*3/uL (ref 0.1–1.0)
MONOS PCT: 7 %
Neutro Abs: 9.5 10*3/uL — ABNORMAL HIGH (ref 1.7–7.7)
Neutrophils Relative %: 78 %
Platelets: 359 10*3/uL (ref 150–400)
RBC: 4.48 MIL/uL (ref 4.22–5.81)
RDW: 13.3 % (ref 11.5–15.5)
WBC: 12.2 10*3/uL — ABNORMAL HIGH (ref 4.0–10.5)

## 2016-07-12 LAB — LACTIC ACID, PLASMA
LACTIC ACID, VENOUS: 1.5 mmol/L (ref 0.5–1.9)
Lactic Acid, Venous: 1.7 mmol/L (ref 0.5–1.9)

## 2016-07-12 LAB — I-STAT CG4 LACTIC ACID, ED
LACTIC ACID, VENOUS: 2.1 mmol/L — AB (ref 0.5–1.9)
Lactic Acid, Venous: 1.97 mmol/L (ref 0.5–1.9)

## 2016-07-12 LAB — PROCALCITONIN

## 2016-07-12 LAB — APTT: APTT: 24 s (ref 24–36)

## 2016-07-12 LAB — PROTIME-INR
INR: 1.01
Prothrombin Time: 13.3 seconds (ref 11.4–15.2)

## 2016-07-12 LAB — CK: CK TOTAL: 29 U/L — AB (ref 49–397)

## 2016-07-12 LAB — SEDIMENTATION RATE: Sed Rate: 1 mm/hr (ref 0–16)

## 2016-07-12 LAB — TSH: TSH: 0.52 u[IU]/mL (ref 0.350–4.500)

## 2016-07-12 LAB — C-REACTIVE PROTEIN: CRP: <0.8 mg/dL (ref <1.0)

## 2016-07-12 MED ORDER — TRAMADOL HCL 50 MG PO TABS
50.0000 mg | ORAL_TABLET | Freq: Four times a day (QID) | ORAL | Status: DC | PRN
  Administered 2016-07-12 – 2016-07-13 (×3): 50 mg via ORAL
  Filled 2016-07-12 (×3): qty 1

## 2016-07-12 MED ORDER — FAMOTIDINE 20 MG PO TABS
40.0000 mg | ORAL_TABLET | Freq: Every day | ORAL | Status: DC
  Administered 2016-07-12 – 2016-07-13 (×2): 40 mg via ORAL
  Filled 2016-07-12 (×2): qty 2

## 2016-07-12 MED ORDER — ACETAMINOPHEN 650 MG RE SUPP: 650.0000 mg | Freq: Four times a day (QID) | RECTAL | Status: DC | PRN

## 2016-07-12 MED ORDER — TOPIRAMATE 25 MG PO TABS: 50.0000 mg | ORAL_TABLET | Freq: Every day | ORAL | Status: DC

## 2016-07-12 MED ORDER — ZOLPIDEM TARTRATE 5 MG PO TABS
5.0000 mg | ORAL_TABLET | Freq: Once | ORAL | Status: AC
  Administered 2016-07-13: 5 mg via ORAL
  Filled 2016-07-12: qty 1

## 2016-07-12 MED ORDER — ONDANSETRON HCL 4 MG/2ML IJ SOLN: 4.0000 mg | Freq: Four times a day (QID) | INTRAMUSCULAR | Status: DC | PRN

## 2016-07-12 MED ORDER — LEVALBUTEROL HCL 0.63 MG/3ML IN NEBU
0.6300 mg | INHALATION_SOLUTION | RESPIRATORY_TRACT | Status: DC | PRN
  Administered 2016-07-12: 0.63 mg via RESPIRATORY_TRACT
  Filled 2016-07-12: qty 3

## 2016-07-12 MED ORDER — POTASSIUM CHLORIDE CRYS ER 20 MEQ PO TBCR
40.0000 meq | EXTENDED_RELEASE_TABLET | ORAL | Status: AC
  Administered 2016-07-12: 40 meq via ORAL
  Filled 2016-07-12: qty 2

## 2016-07-12 MED ORDER — SODIUM CHLORIDE 0.9 % IV SOLN
INTRAVENOUS | Status: DC
  Administered 2016-07-12 (×2): via INTRAVENOUS

## 2016-07-12 MED ORDER — SODIUM CHLORIDE 0.9 % IV BOLUS (SEPSIS)
1000.0000 mL | Freq: Once | INTRAVENOUS | Status: AC
  Administered 2016-07-12: 1000 mL via INTRAVENOUS

## 2016-07-12 MED ORDER — IOPAMIDOL (ISOVUE-370) INJECTION 76%
100.0000 mL | Freq: Once | INTRAVENOUS | Status: AC | PRN
  Administered 2016-07-12: 100 mL via INTRAVENOUS

## 2016-07-12 MED ORDER — ALBUTEROL SULFATE (2.5 MG/3ML) 0.083% IN NEBU: 2.5000 mg | INHALATION_SOLUTION | RESPIRATORY_TRACT | Status: DC | PRN

## 2016-07-12 MED ORDER — ENOXAPARIN SODIUM 40 MG/0.4ML ~~LOC~~ SOLN
40.0000 mg | SUBCUTANEOUS | Status: DC
  Administered 2016-07-12: 40 mg via SUBCUTANEOUS
  Filled 2016-07-12: qty 0.4

## 2016-07-12 MED ORDER — TAMSULOSIN HCL 0.4 MG PO CAPS
0.4000 mg | ORAL_CAPSULE | Freq: Every day | ORAL | Status: DC
  Administered 2016-07-12 – 2016-07-13 (×2): 0.4 mg via ORAL
  Filled 2016-07-12 (×2): qty 1

## 2016-07-12 MED ORDER — ONDANSETRON HCL 4 MG PO TABS: 4.0000 mg | ORAL_TABLET | Freq: Four times a day (QID) | ORAL | Status: DC | PRN

## 2016-07-12 MED ORDER — NIFEDIPINE ER OSMOTIC RELEASE 30 MG PO TB24
30.0000 mg | ORAL_TABLET | Freq: Every day | ORAL | Status: DC
  Administered 2016-07-12 – 2016-07-13 (×2): 30 mg via ORAL
  Filled 2016-07-12 (×3): qty 1

## 2016-07-12 MED ORDER — DIAZEPAM 5 MG PO TABS: 10.0000 mg | ORAL_TABLET | Freq: Three times a day (TID) | ORAL | Status: DC | PRN

## 2016-07-12 MED ORDER — ACETAMINOPHEN 325 MG PO TABS
650.0000 mg | ORAL_TABLET | Freq: Four times a day (QID) | ORAL | Status: DC | PRN
  Administered 2016-07-12 – 2016-07-13 (×2): 650 mg via ORAL
  Filled 2016-07-12 (×3): qty 2

## 2016-07-12 NOTE — Progress Notes (Signed)
Patient arrived to floor with complaints of chronic pain and a racing heartbeat. Says his pain goes from a 8 to a 6 and gets better and worse all the time. CCMD called, physician paged for orders. Call bell within reach, patient oriented to unit and current plan of care.

## 2016-07-12 NOTE — Plan of Care (Addendum)
56 yo M with 8 day history of SOB, insomnia, tachycardia.  WBC 18k.  CT chest is negative.  With ambulation HR 170 sat to 91%.  Questionable h/o MS, h/o polypharmacy, substance abuse, overdose admission in Jan to [facility].  Patient going to tele obs for persistent tachycardia and desat with ambulation.  Tachycardia due to withdrawal?  I would strongly advise against initiating narcotics on the patient (see also [facility] notes, 2/13 consult, Jan admission, etc)
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
## 2016-07-12 NOTE — ED Notes (Signed)
C/o chest pain, ha, arm, legs and feet pain x several days  wprse tonight

## 2016-07-13 DIAGNOSIS — R6510 Systemic inflammatory response syndrome (SIRS) of non-infectious origin without acute organ dysfunction: Secondary | ICD-10-CM | POA: Diagnosis not present

## 2016-07-13 LAB — BASIC METABOLIC PANEL
Anion gap: 11 (ref 5–15)
BUN: 16 mg/dL (ref 6–20)
CHLORIDE: 104 mmol/L (ref 101–111)
CO2: 23 mmol/L (ref 22–32)
Calcium: 8.5 mg/dL — ABNORMAL LOW (ref 8.9–10.3)
Creatinine, Ser: 0.7 mg/dL (ref 0.61–1.24)
GFR calc Af Amer: >60 mL/min (ref >60)
GFR calc non Af Amer: >60 mL/min (ref >60)
GLUCOSE: 99 mg/dL (ref 65–99)
POTASSIUM: 3.6 mmol/L (ref 3.5–5.1)
Sodium: 138 mmol/L (ref 135–145)

## 2016-07-13 LAB — CBC
HEMATOCRIT: 38.1 % — AB (ref 39.0–52.0)
HEMOGLOBIN: 12.9 g/dL — AB (ref 13.0–17.0)
MCH: 30.7 pg (ref 26.0–34.0)
MCHC: 33.9 g/dL (ref 30.0–36.0)
MCV: 90.7 fL (ref 78.0–100.0)
Platelets: 317 10*3/uL (ref 150–400)
RBC: 4.2 MIL/uL — AB (ref 4.22–5.81)
RDW: 13.4 % (ref 11.5–15.5)
WBC: 10.7 10*3/uL — AB (ref 4.0–10.5)

## 2016-07-13 LAB — HIV ANTIBODY (ROUTINE TESTING W REFLEX): HIV Screen 4th Generation wRfx: NONREACTIVE

## 2016-07-13 MED ORDER — METOPROLOL TARTRATE 25 MG PO TABS
25.0000 mg | ORAL_TABLET | Freq: Two times a day (BID) | ORAL | Status: DC
  Administered 2016-07-13: 25 mg via ORAL
  Filled 2016-07-13: qty 1

## 2016-07-13 MED ORDER — HYDROXYZINE HCL 25 MG PO TABS: 25.0000 mg | ORAL_TABLET | Freq: Three times a day (TID) | ORAL | Status: DC | PRN

## 2016-07-13 MED ORDER — VALSARTAN 40 MG PO TABS: 40.0000 mg | ORAL_TABLET | Freq: Every day | ORAL | 0 refills | Status: DC

## 2016-07-13 MED ORDER — HYDROXYZINE HCL 25 MG PO TABS: 25.0000 mg | ORAL_TABLET | Freq: Three times a day (TID) | ORAL | 0 refills | Status: DC | PRN

## 2016-07-13 MED ORDER — METOPROLOL TARTRATE 25 MG PO TABS: 25.0000 mg | ORAL_TABLET | Freq: Two times a day (BID) | ORAL | 0 refills | Status: DC
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
## 2016-07-13 NOTE — Progress Notes (Signed)
Pt ambulated in the hallway SpO2 100% HR 140-130's. Pt states he is not leaving until its taken care of.

## 2016-07-13 NOTE — Progress Notes (Signed)
Pt sitting up in bed listening to music flat affect, c/o 9.5/10 pain all over given ordered pain medication. ST on tele daily medications given.

## 2016-07-15 LAB — DRUG PROFILE, UR, 9 DRUGS (LABCORP)
Amphetamines, Urine: NEGATIVE ng/mL
Barbiturate, Ur: NEGATIVE ng/mL
Benzodiazepine Quant, Ur: NEGATIVE ng/mL
CANNABINOID QUANT UR: NEGATIVE ng/mL
COCAINE (METAB.): NEGATIVE ng/mL
METHADONE SCREEN, URINE: NEGATIVE ng/mL
Opiate Quant, Ur: NEGATIVE ng/mL
PHENCYCLIDINE, UR: NEGATIVE ng/mL
Propoxyphene, Urine: NEGATIVE ng/mL

## 2016-07-16 ENCOUNTER — Ambulatory Visit (INDEPENDENT_AMBULATORY_CARE_PROVIDER_SITE_OTHER): Payer: Medicare Other | Admitting: Specialist

## 2016-07-17 LAB — CULTURE, BLOOD (ROUTINE X 2)
CULTURE: NO GROWTH
Culture: NO GROWTH

## 2016-07-23 ENCOUNTER — Other Ambulatory Visit: Payer: Self-pay | Admitting: Family Medicine

## 2016-07-23 DIAGNOSIS — E079 Disorder of thyroid, unspecified: Secondary | ICD-10-CM

## 2016-07-25 ENCOUNTER — Ambulatory Visit (HOSPITAL_BASED_OUTPATIENT_CLINIC_OR_DEPARTMENT_OTHER)
Admission: RE | Admit: 2016-07-25 | Discharge: 2016-07-25 | Disposition: A | Discharge status: Home / Self Care | Payer: 59 | Source: Ambulatory Visit | Attending: Family Medicine | Admitting: Family Medicine

## 2016-07-25 DIAGNOSIS — E010 Iodine-deficiency related diffuse (endemic) goiter: Secondary | ICD-10-CM | POA: Insufficient documentation

## 2016-07-25 DIAGNOSIS — E079 Disorder of thyroid, unspecified: Secondary | ICD-10-CM

## 2016-07-30 ENCOUNTER — Telehealth: Payer: Self-pay | Admitting: Neurology

## 2016-07-30 NOTE — Telephone Encounter (Signed)
LMOM (identified vm) for Joel Galloway to call/fim
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
## 2016-07-31 ENCOUNTER — Telehealth: Payer: Self-pay | Admitting: Neurology

## 2016-07-31 NOTE — Telephone Encounter (Addendum)
Dr English as a second language teacher for [facility] [phone] requests to speak with Dr Felecia Shelling or RN. Nechama Guard was also on the phone.

## 2016-07-31 NOTE — Telephone Encounter (Signed)
I returned a phone call to Dr. Naaman Plummer (a [place]).    I saw him x 2 for "multiple sclerosis"   however, I do not think he has MS did not start any disease modifying therapy. He had been diagnosed by one doctor in the past but has not been diagnosed with MS by other doctors in the past but was on disease modifying therapies for a while.    They had wanted to find out more information as he was having some erratic behavior. I let them know that he was seen at [facility] in November, December and January and was seen by psychiatry at some point during that time. I do not have the name of his psychiatrist.

## 2016-08-06 ENCOUNTER — Telehealth (INDEPENDENT_AMBULATORY_CARE_PROVIDER_SITE_OTHER): Payer: Self-pay | Admitting: Orthopaedic Surgery

## 2016-08-06 NOTE — Telephone Encounter (Signed)
LMOM for [employer] to call back. Dr. Ninfa Linden has not given him anything, patient has a pain management doctor.
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
## 2016-09-06 ENCOUNTER — Other Ambulatory Visit: Payer: Self-pay | Admitting: Neurology

## 2016-10-14 ENCOUNTER — Ambulatory Visit (INDEPENDENT_AMBULATORY_CARE_PROVIDER_SITE_OTHER): Payer: 59 | Admitting: Specialist

## 2016-10-14 ENCOUNTER — Ambulatory Visit (INDEPENDENT_AMBULATORY_CARE_PROVIDER_SITE_OTHER): Payer: 59 | Admitting: Neurology

## 2016-10-14 ENCOUNTER — Encounter: Payer: Self-pay | Admitting: Neurology

## 2016-10-14 DIAGNOSIS — R350 Frequency of micturition: Secondary | ICD-10-CM

## 2016-10-14 DIAGNOSIS — G894 Chronic pain syndrome: Secondary | ICD-10-CM

## 2016-10-14 DIAGNOSIS — G43709 Chronic migraine without aura, not intractable, without status migrainosus: Secondary | ICD-10-CM | POA: Diagnosis not present

## 2016-10-14 DIAGNOSIS — R413 Other amnesia: Secondary | ICD-10-CM
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
## 2016-10-20 ENCOUNTER — Ambulatory Visit (INDEPENDENT_AMBULATORY_CARE_PROVIDER_SITE_OTHER): Payer: 59

## 2016-10-20 ENCOUNTER — Encounter (INDEPENDENT_AMBULATORY_CARE_PROVIDER_SITE_OTHER): Payer: Self-pay | Admitting: Specialist

## 2016-10-20 ENCOUNTER — Ambulatory Visit (INDEPENDENT_AMBULATORY_CARE_PROVIDER_SITE_OTHER): Payer: 59 | Admitting: Specialist

## 2016-10-20 VITALS — BP 123/76 | HR 57 | Ht 72.0 in | Wt 185.0 lb

## 2016-10-20 DIAGNOSIS — M542 Cervicalgia: Secondary | ICD-10-CM | POA: Diagnosis not present

## 2016-10-20 DIAGNOSIS — M25511 Pain in right shoulder: Secondary | ICD-10-CM

## 2016-10-20 DIAGNOSIS — M7541 Impingement syndrome of right shoulder: Secondary | ICD-10-CM | POA: Diagnosis not present

## 2016-10-20 MED ORDER — LIDOCAINE HCL 1 % IJ SOLN
3.0000 mL | INTRAMUSCULAR | Status: AC | PRN
  Administered 2016-10-20: 3 mL

## 2016-10-20 MED ORDER — METHYLPREDNISOLONE ACETATE 40 MG/ML IJ SUSP
40.0000 mg | INTRAMUSCULAR | Status: AC | PRN
  Administered 2016-10-20: 40 mg via INTRA_ARTICULAR

## 2016-10-20 MED ORDER — BUPIVACAINE HCL 0.25 % IJ SOLN
4.0000 mL | INTRAMUSCULAR | Status: AC | PRN
  Administered 2016-10-20: 4 mL via INTRA_ARTICULAR
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
## 2016-11-03 ENCOUNTER — Encounter (INDEPENDENT_AMBULATORY_CARE_PROVIDER_SITE_OTHER): Payer: Self-pay | Admitting: Physical Medicine and Rehabilitation

## 2016-11-03 ENCOUNTER — Ambulatory Visit (INDEPENDENT_AMBULATORY_CARE_PROVIDER_SITE_OTHER): Payer: 59

## 2016-11-03 ENCOUNTER — Ambulatory Visit (INDEPENDENT_AMBULATORY_CARE_PROVIDER_SITE_OTHER): Payer: 59 | Admitting: Physical Medicine and Rehabilitation

## 2016-11-03 VITALS — BP 138/85 | HR 75

## 2016-11-03 DIAGNOSIS — M542 Cervicalgia: Secondary | ICD-10-CM

## 2016-11-03 DIAGNOSIS — M609 Myositis, unspecified: Secondary | ICD-10-CM

## 2016-11-03 DIAGNOSIS — M5412 Radiculopathy, cervical region: Secondary | ICD-10-CM

## 2016-11-03 MED ORDER — METHYLPREDNISOLONE ACETATE 80 MG/ML IJ SUSP
80.0000 mg | Freq: Once | INTRAMUSCULAR | Status: AC
  Administered 2016-11-03: 80 mg

## 2016-11-03 MED ORDER — LIDOCAINE HCL (PF) 1 % IJ SOLN
2.0000 mL | Freq: Once | INTRAMUSCULAR | Status: AC
  Administered 2016-11-03: 2 mL
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
## 2016-11-03 NOTE — Patient Instructions (Signed)

## 2016-11-03 NOTE — Progress Notes (Deleted)
Joel Galloway - 56 y.o. male MRN [id number]  Date of birth: [birth year]  Office Visit Note: Visit Date: 11/03/2016 PCP: Tamsen Roers, MD Referred by: Tamsen Roers, MD  Subjective: Chief Complaint  Patient presents with  . Neck - Pain   HPI: HPI  ROS Otherwise per HPI.  Assessment & Plan: Visit Diagnoses:  1. Cervical radiculopathy     Plan: No additional findings.   Meds & Orders:  Meds ordered this encounter  Medications  . lidocaine (PF) (XYLOCAINE) 1 % injection 2 mL  . methylPREDNISolone acetate (DEPO-MEDROL) injection 80 mg    Orders Placed This Encounter  Procedures  . XR C-ARM NO REPORT  . Epidural Steroid injection    Follow-up: Return for Dr. Louanne Skye.   Procedures: No procedures performed  No notes on file   Clinical History: MRI CERVICAL SPINE FINDINGS 06/28/2016   The cord is visualized from the craniocervical junction through T3.  Alignment: Straightening, even slight reversal, of the normal cervical lordosis.  Vertebrae: No fracture, evidence of diskitis, or worrisome osseous lesion.  Cord: Cord flattening at C5-6 and C6-7 without abnormal cord signal. Specifically, no peripheral white matter lesions are seen to suggest demyelinating disease. There is no abnormal enhancement cord.  Posterior Fossa, vertebral arteries, paraspinal tissues: Unremarkable.  Disc levels:  C2-3:Normal.  C3-4:Normal.  C4-5: Leftward protrusion facet arthropathy. No definite foraminal narrowing.  C5-6: Advanced disc space narrowing. Disc osteophyte complex projects posteriorly. Mild to moderate stenosis. Mild cord flattening. BILATERAL C6 foraminal narrowing. Canal diameter adequate at 8 mm.  C6-7: Central and leftward protrusion/uncinate spurring. Disc space narrowing. LEFT C7 foraminal narrowing. Mild stenosis without cord flattening.  C7-T1:Annular bulge.No impingement.  He reports that he has never smoked. He has never used smokeless  tobacco. No results for input(s): HGBA1C, LABURIC in the last 8760 hours.  Objective:  VS:  HT:    WT:   BMI:     BP:138/85  HR:75bpm  TEMP: ( )  RESP:98 % Physical Exam  Ortho Exam Imaging: Xr C-arm No Report  Result Date: 11/03/2016 Please see Notes or Procedures tab for imaging impression.   Past Medical/Family/Surgical/Social History: Medications & Allergies reviewed per EMR Patient Active Problem List   Diagnosis Date Noted  . Memory difficulties 10/14/2016  . Tachycardia 07/12/2016  . Chest pain 07/12/2016  . Polysubstance abuse 07/12/2016  . SIRS (systemic inflammatory response syndrome) ([place]) 07/12/2016  . Lactic acidosis 07/12/2016  . Anxiety state 07/12/2016  . Abnormal brain MRI 07/08/2016  . Chronic pain syndrome 07/08/2016  . Chronic migraine w/o aura w/o status migrainosus, not intractable 07/08/2016  . History of optic neuritis 06/02/2016  . Numbness 06/02/2016  . Gait disturbance 06/02/2016  . Urinary frequency 06/02/2016  . Other fatigue 06/02/2016  . Leg cramp 06/02/2016  . Pain in right hand 05/26/2016  . Chronic bilateral low back pain 05/26/2016  . Closed fracture of body of sternum 05/26/2016  . Nail bed injury right middle finger 07/13/2014  . Lumbar degenerative disc disease 01/12/2013    Class: Chronic  . HNP (herniated nucleus pulposus), lumbar 01/12/2013    Class: Chronic  . Spinal stenosis, lumbar region, with neurogenic claudication 01/12/2013    Class: Chronic   Past Medical History:  Diagnosis Date  . Anxiety   . Arthritis   . Asthma   . Cancer ([place])    skin   . Chronic kidney disease   . Coronary artery disease   . Headache    pt reports  migraines  . Hypoglycemia    las t episode 12/24/12  . Multiple sclerosis ([place])   . Seasonal allergies   . Seizures ([place])   . Sleep apnea    Pt reports "mild sleep apnea"  . Vision abnormalities    Family History  Problem Relation Age of Onset  . Hypertension Mother   . Obesity  Mother   . Diabetes Mother   . Anxiety disorder Mother   . Ataxia Neg Hx   . Chorea Neg Hx   . Dementia Neg Hx   . Mental retardation Neg Hx   . Migraines Neg Hx   . Multiple sclerosis Neg Hx   . Neurofibromatosis Neg Hx   . Neuropathy Neg Hx   . Parkinsonism Neg Hx   . Seizures Neg Hx   . Stroke Neg Hx    Past Surgical History:  Procedure Laterality Date  . BACK SURGERY    . HEMATOMA EVACUATION Right 07/13/2014   Procedure: EVACUATION HEMATOMA RIGHT MIDDLE FINGER;  Surgeon: Mcarthur Rossetti, MD;  Location: [place] [facility];  Service: Orthopedics;  Laterality: Right;  . KNEE SURGERY Left    x2  arthroscopy  . NOSE SURGERY    . POSTERIOR FUSION LUMBAR SPINE  01/10/2013   Dr Louanne Skye  . WISDOM TOOTH EXTRACTION     Social History   Occupational History  . Not on file.   Social History Main Topics  . Smoking status: Never Smoker  . Smokeless tobacco: Never Used  . Alcohol use No  . Drug use: No  . Sexual activity: Not on file
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
## 2016-11-06 ENCOUNTER — Ambulatory Visit (INDEPENDENT_AMBULATORY_CARE_PROVIDER_SITE_OTHER): Payer: 59 | Admitting: Specialist

## 2016-12-01 ENCOUNTER — Ambulatory Visit (INDEPENDENT_AMBULATORY_CARE_PROVIDER_SITE_OTHER): Payer: 59 | Admitting: Physician Assistant

## 2016-12-04 ENCOUNTER — Telehealth (INDEPENDENT_AMBULATORY_CARE_PROVIDER_SITE_OTHER): Payer: Self-pay | Admitting: Radiology

## 2016-12-04 ENCOUNTER — Ambulatory Visit (INDEPENDENT_AMBULATORY_CARE_PROVIDER_SITE_OTHER): Payer: 59 | Admitting: Specialist

## 2016-12-04 ENCOUNTER — Telehealth (INDEPENDENT_AMBULATORY_CARE_PROVIDER_SITE_OTHER): Payer: Self-pay | Admitting: Specialist

## 2016-12-04 ENCOUNTER — Encounter (INDEPENDENT_AMBULATORY_CARE_PROVIDER_SITE_OTHER): Payer: Self-pay | Admitting: Specialist

## 2016-12-04 VITALS — BP 115/80 | HR 78 | Ht 72.0 in | Wt 185.0 lb

## 2016-12-04 DIAGNOSIS — M4802 Spinal stenosis, cervical region: Secondary | ICD-10-CM | POA: Diagnosis not present

## 2016-12-04 DIAGNOSIS — M542 Cervicalgia: Secondary | ICD-10-CM

## 2016-12-04 DIAGNOSIS — M5030 Other cervical disc degeneration, unspecified cervical region: Secondary | ICD-10-CM
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
## 2016-12-04 NOTE — Telephone Encounter (Signed)
Please open up something for Dr. Louanne Skye in 2 wks per Louanne Skye please.  [phone]

## 2016-12-05 NOTE — Telephone Encounter (Signed)
Let me know when and you can call him.

## 2016-12-18 ENCOUNTER — Telehealth (INDEPENDENT_AMBULATORY_CARE_PROVIDER_SITE_OTHER): Payer: Self-pay | Admitting: *Deleted

## 2016-12-18 ENCOUNTER — Ambulatory Visit (INDEPENDENT_AMBULATORY_CARE_PROVIDER_SITE_OTHER): Payer: 59 | Admitting: Specialist

## 2016-12-18 ENCOUNTER — Encounter (INDEPENDENT_AMBULATORY_CARE_PROVIDER_SITE_OTHER): Payer: Self-pay | Admitting: Specialist

## 2016-12-18 VITALS — BP 112/76 | HR 57 | Ht 72.0 in | Wt 185.0 lb

## 2016-12-18 DIAGNOSIS — M4722 Other spondylosis with radiculopathy, cervical region: Secondary | ICD-10-CM

## 2016-12-18 DIAGNOSIS — M5020 Other cervical disc displacement, unspecified cervical region: Secondary | ICD-10-CM | POA: Diagnosis not present

## 2016-12-18 MED ORDER — HYDROCODONE-ACETAMINOPHEN 10-325 MG PO TABS: 1.0000 | ORAL_TABLET | Freq: Four times a day (QID) | ORAL | 0 refills | Status: DC | PRN
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
## 2016-12-18 NOTE — Telephone Encounter (Signed)
PT is having MRI, stated that he needs to come back ASAP for the review. I scheduled him for the first available.

## 2016-12-18 NOTE — Progress Notes (Signed)
Office Visit Note   Patient: Joel Galloway           Date of Birth: 13-Apr-1961           MRN: [id number] Visit Date: 12/18/2016              Requested by: Tamsen Roers, [place], [place] [address] PCP: Tamsen Roers, MD   Assessment & Plan: Visit Diagnoses:  1. Other spondylosis with radiculopathy, cervical region   2. HNP (herniated nucleus pulposus), cervical   3. Cervical disc herniation     Plan: Avoid overhead lifting and overhead use of the arms. Do not lift greater than 5 lbs. Adjust head rest in vehicle to prevent hyperextension if rear ended. Take extra precautions to avoid falling, including use of a cane if you feel weak. Scheduling secretary Kandice Hams. will call you to arrange for surgery for your cervical spine. If you wish a second opinion please let us know and we can arrange for you. If you have worsening arm or leg numbness or weakness please call or go to an ER. We will contact your cardiologist and primary care physicians to seek clearance for your surgery. Surgery will be an anterior discectomy and fusion C5-6 and C6-7 with decompression of the cervical spinal canal at C6-7 and C5-6 and removal of bone and disc pressing on the spinal cord with bone grafting and plate and screws, Local bone graft as well and allograft bone graft and vivigen.  Risks of surgery include risks of infection, bleeding and risks to the spinal cord and  Risks of sore throat and difficulty swallowing which should  Improve over the next 4-6 weeks following surgery. Surgery is indicated due to upper extremity radiculopathy, lermittes phenomena and falls. In the future surgery at adjacent levels may be necessary but these levels do not appear to be related to your current symptoms or signs. MRI is ordered to assess the C4-5 level if no change in the  C4-5 small protrusion then I would recommend limiting the fusion to the worst segment.  Follow-Up Instructions: No  Follow-up on file.   Orders:  Orders Placed This Encounter  Procedures  . MR Cervical Spine w/o contrast   Meds ordered this encounter  Medications  . HYDROcodone-acetaminophen (NORCO) 10-325 MG tablet    Sig: Take 1 tablet by mouth every 6 (six) hours as needed.    Dispense:  30 tablet    Refill:  0      Procedures: No procedures performed   Clinical Data: No additional findings.   Subjective: No chief complaint on file.   HPI  Review of Systems   Objective: Vital Signs: BP 112/76 (BP Location: Left Arm, Patient Position: Sitting)   Pulse (!) 57   Ht 6' (1.829 m)   Wt 185 lb (83.9 kg)   BMI 25.09 kg/m   Physical Exam  Ortho Exam  Specialty Comments:  No specialty comments available.  Imaging: No results found.   PMFS History: Patient Active Problem List   Diagnosis Date Noted  . Lumbar degenerative disc disease 01/12/2013    Priority: High    Class: Chronic  . HNP (herniated nucleus pulposus), lumbar 01/12/2013    Priority: High    Class: Chronic  . Spinal stenosis, lumbar region, with neurogenic claudication 01/12/2013    Priority: High    Class: Chronic  . Memory difficulties 10/14/2016  . Tachycardia 07/12/2016  . Chest pain 07/12/2016  . Polysubstance  abuse 07/12/2016  . SIRS (systemic inflammatory response syndrome) ([place]) 07/12/2016  . Lactic acidosis 07/12/2016  . Anxiety state 07/12/2016  . Abnormal brain MRI 07/08/2016  . Chronic pain syndrome 07/08/2016  . Chronic migraine w/o aura w/o status migrainosus, not intractable 07/08/2016  . History of optic neuritis 06/02/2016  . Numbness 06/02/2016  . Gait disturbance 06/02/2016  . Urinary frequency 06/02/2016  . Other fatigue 06/02/2016  . Leg cramp 06/02/2016  . Pain in right hand 05/26/2016  . Chronic bilateral low back pain 05/26/2016  . Closed fracture of body of sternum 05/26/2016  . Nail bed injury right middle finger 07/13/2014   Past Medical History:  Diagnosis Date    . Anxiety   . Arthritis   . Asthma   . Cancer ([place])    skin   . Chronic kidney disease   . Coronary artery disease   . Headache    pt reports migraines  . Hypoglycemia    las t episode 12/24/12  . Multiple sclerosis ([place])   . Seasonal allergies   . Seizures ([place])   . Sleep apnea    Pt reports "mild sleep apnea"  . Vision abnormalities     Family History  Problem Relation Age of Onset  . Hypertension Mother   . Obesity Mother   . Diabetes Mother   . Anxiety disorder Mother   . Ataxia Neg Hx   . Chorea Neg Hx   . Dementia Neg Hx   . Mental retardation Neg Hx   . Migraines Neg Hx   . Multiple sclerosis Neg Hx   . Neurofibromatosis Neg Hx   . Neuropathy Neg Hx   . Parkinsonism Neg Hx   . Seizures Neg Hx   . Stroke Neg Hx     Past Surgical History:  Procedure Laterality Date  . BACK SURGERY    . HEMATOMA EVACUATION Right 07/13/2014   Procedure: EVACUATION HEMATOMA RIGHT MIDDLE FINGER;  Surgeon: Mcarthur Rossetti, MD;  Location: [facility];  Service: Orthopedics;  Laterality: Right;  . KNEE SURGERY Left    x2  arthroscopy  . NOSE SURGERY    . POSTERIOR FUSION LUMBAR SPINE  01/10/2013   Dr Louanne Skye  . WISDOM TOOTH EXTRACTION     Social History   Occupational History  . Not on file.   Social History Main Topics  . Smoking status: Never Smoker  . Smokeless tobacco: Never Used  . Alcohol use No  . Drug use: No  . Sexual activity: Not on file

## 2016-12-18 NOTE — Telephone Encounter (Signed)
Put on cancellation list. We will call if something opens after the scan has been done.

## 2016-12-18 NOTE — Addendum Note (Signed)
Addended by: [name] on: 12/18/2016 12:54 PM   Modules accepted: Orders

## 2016-12-24 ENCOUNTER — Other Ambulatory Visit (INDEPENDENT_AMBULATORY_CARE_PROVIDER_SITE_OTHER): Payer: Self-pay | Admitting: Specialist

## 2016-12-24 ENCOUNTER — Telehealth (INDEPENDENT_AMBULATORY_CARE_PROVIDER_SITE_OTHER): Payer: Self-pay | Admitting: Specialist

## 2016-12-24 MED ORDER — DIAZEPAM 5 MG PO TABS: ORAL_TABLET | ORAL | 0 refills | Status: DC

## 2016-12-24 NOTE — Telephone Encounter (Signed)
I Called and spoke with patient --I told him that we have to review the MRI BEFORE surgery is scheduled due to the scan may change the plan that he has setup for.  And his appt on 9/6 was the first thing that we have at this time, however I do have him on the cancellation list so if someone cancels at anytime that I would give him a call.  Patient was not happy with this and hung up on me.

## 2016-12-24 NOTE — Telephone Encounter (Signed)
Patient has requested claustrophobia medication for his MRI. CB # [phone]

## 2016-12-24 NOTE — Telephone Encounter (Signed)
Patient has requested claustrophobia medication for his MRI--sched for 12/27/16

## 2016-12-24 NOTE — Telephone Encounter (Signed)
Patient called asking for a call back at your earliest convenience to find out when he can get surgery. CB # [phone] Can leave detailed message and he will call you back asap.

## 2016-12-24 NOTE — Telephone Encounter (Signed)
Rx for valium prescribed, he will need to pick up the written prescription.

## 2016-12-24 NOTE — Telephone Encounter (Signed)
Do you know anything about this?  Is he getting MRI first?

## 2016-12-25 NOTE — Telephone Encounter (Signed)
Rx called to [facility] and patient is aware.

## 2016-12-27 ENCOUNTER — Ambulatory Visit (HOSPITAL_BASED_OUTPATIENT_CLINIC_OR_DEPARTMENT_OTHER)
Admission: RE | Admit: 2016-12-27 | Discharge: 2016-12-27 | Disposition: A | Discharge status: Home / Self Care | Payer: 59 | Source: Ambulatory Visit | Attending: Specialist | Admitting: Specialist

## 2016-12-27 DIAGNOSIS — M4722 Other spondylosis with radiculopathy, cervical region: Secondary | ICD-10-CM

## 2016-12-27 DIAGNOSIS — M4802 Spinal stenosis, cervical region: Secondary | ICD-10-CM | POA: Insufficient documentation

## 2017-01-07 ENCOUNTER — Ambulatory Visit (INDEPENDENT_AMBULATORY_CARE_PROVIDER_SITE_OTHER): Payer: 59 | Admitting: Specialist

## 2017-01-07 DIAGNOSIS — M4802 Spinal stenosis, cervical region: Secondary | ICD-10-CM

## 2017-01-07 DIAGNOSIS — M5030 Other cervical disc degeneration, unspecified cervical region: Secondary | ICD-10-CM

## 2017-01-07 DIAGNOSIS — M4722 Other spondylosis with radiculopathy, cervical region: Secondary | ICD-10-CM

## 2017-01-07 MED ORDER — OXYCODONE-ACETAMINOPHEN 10-325 MG PO TABS: 1.0000 | ORAL_TABLET | Freq: Four times a day (QID) | ORAL | 0 refills | Status: DC | PRN
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
## 2017-01-07 NOTE — Patient Instructions (Signed)
Avoid overhead lifting and overhead use of the arms. Do not lift greater than 5 lbs. Adjust head rest in vehicle to prevent hyperextension if rear ended. Take extra precautions to avoid falling, including use of a cane if you feel weak. Scheduling secretary Kandice Hams. will call you to arrange for surgery for your cervical spine. If you wish a second opinion please let us know and we can arrange for you. If you have worsening arm or leg numbness or weakness please call or go to an ER.  Surgery will be an anterior corpectomy at the C6 level with decompression of the cervical spinal canal at C6-7 and C5-6 and removal of bone pressing on the spinal cord. An additional decompression at the C5-6 andC6-7 level with bone grafting and plate and screws, Local bone graft as well and allograft bone graft and vivigen.  Risks of surgery include risks of infection, bleeding and risks to the spinal cord and  Risks of sore throat and difficulty swallowing which should  Improve over the next 4-6 weeks following surgery. Surgery is indicated due to upper extremity radiculopathy, lermittes phenomena and falls. In the future surgery at adjacent levels may be necessary but these levels do not appear to be related to your current symptoms or signs.

## 2017-01-07 NOTE — Addendum Note (Signed)
Addended by: [name] on: 01/07/2017 11:05 AM   Modules accepted: Orders

## 2017-01-08 ENCOUNTER — Telehealth (INDEPENDENT_AMBULATORY_CARE_PROVIDER_SITE_OTHER): Payer: Self-pay | Admitting: Specialist

## 2017-01-08 NOTE — Telephone Encounter (Signed)
I tried calling [facility] in [place] on [address], about prior authorization, its a pharmacy listed in patients chart, it is the preferred pharmacy. They are unaware of rx for this patient. I called patient to find out if he tried to fill at another pharmacy, [facility] on [address], he states no he went to [facility] on [address]. He tells me he's been on morphine for over 18 years. He states if he takes 2 percocet this does take the edge off. His insurance is only approving 7 day supply and 21 tablets not the 50 tablets. I called and spoke with Claiborne Billings at [facility] on [address], they do not have prior authorization number because patient did get 7 day supply. They do have the number for patients insurance. [phone] [facility]. I will try calling them for prior authorization for future medications. Medication was denied by insurance because it over exceeded the quantity limit.

## 2017-01-08 NOTE — Telephone Encounter (Signed)
Patient called saying that he needs a pre authorization for his percocets. The pharmacy was only able to give him 21 tablets and the RX was written for 50. He is currently in a lot of pain and was needing this medication to hold him over til his surgery date. Thank you. CB # [phone] Work # [phone] ext [phone]

## 2017-01-09 ENCOUNTER — Encounter (INDEPENDENT_AMBULATORY_CARE_PROVIDER_SITE_OTHER): Payer: Self-pay | Admitting: Specialist

## 2017-01-09 NOTE — Telephone Encounter (Signed)
I did do a prior authorization over the phone with Obi from [facility], who advised insurance is wanting more information since patient has not yet had surgery yet. I completed paper work and also faxed with office notes to [phone]. Pending insurance response.

## 2017-01-12 ENCOUNTER — Telehealth (INDEPENDENT_AMBULATORY_CARE_PROVIDER_SITE_OTHER): Payer: Self-pay | Admitting: Specialist

## 2017-01-12 NOTE — Telephone Encounter (Signed)
Patient wants a new Rx for Percocet for #29. Since that is all his insurance will pay for while waiting for AR to cover higher quantity.

## 2017-01-12 NOTE — Telephone Encounter (Signed)
Patient wants a new Rx for Percocet for #29. Since that is all his insurance will pay for while waiting for [place] to cover higher quantity. pts callback [phone]

## 2017-01-13 ENCOUNTER — Other Ambulatory Visit (INDEPENDENT_AMBULATORY_CARE_PROVIDER_SITE_OTHER): Payer: Self-pay | Admitting: Specialist

## 2017-01-13 NOTE — Telephone Encounter (Signed)
I called and advised patient that his pharm gave him a 7 day supply on 01/08/17, which should last him till 01/15/17, so therefore Dr. Louanne Skye will not write and new rx until 01/15/17 for another weeks supply of the medication.  I advised patient that he could call back on 01/15/17 and at that time request a refill. Patient states that he understands.

## 2017-01-13 NOTE — Telephone Encounter (Signed)
Given 21 tablets on 8/23, this is a 10 day supply, you have had two hospitalizations for overmedication in the past 8 months I can not give you more medications earlier, previously had taken hydrocodone 10mg  and now is taking oxycodone 10mg  this was already an increase in medication strength. Next prescription will be written in 2 days, for another one week. jen

## 2017-01-15 ENCOUNTER — Other Ambulatory Visit (INDEPENDENT_AMBULATORY_CARE_PROVIDER_SITE_OTHER): Payer: Self-pay

## 2017-01-15 ENCOUNTER — Other Ambulatory Visit (INDEPENDENT_AMBULATORY_CARE_PROVIDER_SITE_OTHER): Payer: Self-pay | Admitting: Specialist

## 2017-01-15 MED ORDER — OXYCODONE-ACETAMINOPHEN 10-325 MG PO TABS: 1.0000 | ORAL_TABLET | Freq: Four times a day (QID) | ORAL | 0 refills | Status: DC | PRN

## 2017-01-15 NOTE — Telephone Encounter (Signed)
I called patient to confirm surgery scheduled for Tuesday, 01/20/17.  He is asking for refill on pain meds.  Would like to pick up this afternoon around 3 p.m.

## 2017-01-15 NOTE — Telephone Encounter (Signed)
Patient is aware rx is ready for pick up 

## 2017-01-16 ENCOUNTER — Encounter (HOSPITAL_COMMUNITY)
Admission: RE | Admit: 2017-01-16 | Discharge: 2017-01-16 | Disposition: A | Discharge status: Home / Self Care | Payer: 59 | Source: Ambulatory Visit | Attending: Specialist | Admitting: Specialist

## 2017-01-16 ENCOUNTER — Encounter (HOSPITAL_COMMUNITY): Payer: Self-pay

## 2017-01-16 ENCOUNTER — Other Ambulatory Visit (HOSPITAL_COMMUNITY): Payer: Self-pay | Admitting: *Deleted

## 2017-01-16 DIAGNOSIS — Z01812 Encounter for preprocedural laboratory examination: Secondary | ICD-10-CM | POA: Diagnosis present

## 2017-01-16 DIAGNOSIS — M4802 Spinal stenosis, cervical region: Secondary | ICD-10-CM | POA: Diagnosis not present

## 2017-01-16 HISTORY — DX: Other amnesia: R41.3

## 2017-01-16 HISTORY — DX: Other complications of anesthesia, initial encounter: T88.59XA

## 2017-01-16 HISTORY — DX: Claustrophobia: F40.240

## 2017-01-16 HISTORY — DX: Adverse effect of unspecified anesthetic, initial encounter: T41.45XA

## 2017-01-16 LAB — CBC
HCT: 38.5 % — ABNORMAL LOW (ref 39.0–52.0)
HEMOGLOBIN: 13.3 g/dL (ref 13.0–17.0)
MCH: 32.3 pg (ref 26.0–34.0)
MCHC: 34.5 g/dL (ref 30.0–36.0)
MCV: 93.4 fL (ref 78.0–100.0)
Platelets: 275 10*3/uL (ref 150–400)
RBC: 4.12 MIL/uL — ABNORMAL LOW (ref 4.22–5.81)
RDW: 13.6 % (ref 11.5–15.5)
WBC: 8.5 10*3/uL (ref 4.0–10.5)

## 2017-01-16 LAB — BASIC METABOLIC PANEL
ANION GAP: 9 (ref 5–15)
BUN: 29 mg/dL — AB (ref 6–20)
CO2: 25 mmol/L (ref 22–32)
CREATININE: 1.42 mg/dL — AB (ref 0.61–1.24)
Calcium: 9.5 mg/dL (ref 8.9–10.3)
Chloride: 103 mmol/L (ref 101–111)
GFR calc Af Amer: >60 mL/min (ref >60)
GFR calc non Af Amer: 55 mL/min — ABNORMAL LOW (ref >60)
GLUCOSE: 104 mg/dL — AB (ref 65–99)
Potassium: 4.3 mmol/L (ref 3.5–5.1)
Sodium: 137 mmol/L (ref 135–145)

## 2017-01-16 LAB — SURGICAL PCR SCREEN
MRSA, PCR: NEGATIVE
STAPHYLOCOCCUS AUREUS: NEGATIVE

## 2017-01-16 NOTE — Pre-Procedure Instructions (Signed)
    Joel Galloway  01/16/2017      [facility] [place], [place] [address] [place] [address] Phone: [phone] Fax: [place], [place] - [place]. [address] [place] [address] Phone: [phone] Fax: [phone]    Your procedure is scheduled on Tuesday, January 20, 2017 at 10:15 AM.   Report to [facility] Entrance "A" Admitting Office at 8:15 AM.   Call this number if you have problems the morning of surgery: [phone]   Remember:  Do not eat food or drink liquids after midnight Monday, 01/19/17.  Take these medicines the morning of surgery with A SIP OF WATER: Clonazepam (Klonopin), Metoprolol (Toprol XL), Oxycodone or Tylenol - if needed  Stop Aspirin, NSAIDS (Ibuprofen, Aleve,etc) and Multivitamins as of today.   Do not wear jewelry.  Do not wear lotions, powders, cologne or deodorant.  Men may shave face and neck.  Do not bring valuables to the hospital.  [facility] is not responsible for any belongings or valuables.  Contacts, dentures or bridgework may not be worn into surgery.  Leave your suitcase in the car.  After surgery it may be brought to your room.  For patients admitted to the hospital, discharge time will be determined by your treatment team.  See "Preparing for Surgery" Instruction sheet.  Please read over the fact sheets that you were given.

## 2017-01-20 ENCOUNTER — Observation Stay (HOSPITAL_COMMUNITY): Payer: 59

## 2017-01-20 ENCOUNTER — Ambulatory Visit (HOSPITAL_COMMUNITY)
Admission: RE | Disposition: A | Discharge status: Home / Self Care | Payer: Self-pay | Source: Ambulatory Visit | Attending: Specialist

## 2017-01-20 ENCOUNTER — Inpatient Hospital Stay (HOSPITAL_COMMUNITY): Payer: 59 | Admitting: Certified Registered Nurse Anesthetist

## 2017-01-20 ENCOUNTER — Inpatient Hospital Stay (HOSPITAL_COMMUNITY): Payer: 59

## 2017-01-20 ENCOUNTER — Observation Stay (HOSPITAL_COMMUNITY)
Admission: RE | Admit: 2017-01-20 | Discharge: 2017-01-21 | Disposition: A | Discharge status: Home / Self Care | Payer: 59 | Source: Ambulatory Visit | Attending: Specialist | Admitting: Specialist

## 2017-01-20 DIAGNOSIS — Z91018 Allergy to other foods: Secondary | ICD-10-CM | POA: Insufficient documentation

## 2017-01-20 DIAGNOSIS — G35 Multiple sclerosis: Secondary | ICD-10-CM | POA: Diagnosis not present

## 2017-01-20 DIAGNOSIS — M4802 Spinal stenosis, cervical region: Principal | ICD-10-CM | POA: Insufficient documentation

## 2017-01-20 DIAGNOSIS — Z79899 Other long term (current) drug therapy: Secondary | ICD-10-CM | POA: Diagnosis not present

## 2017-01-20 DIAGNOSIS — J45909 Unspecified asthma, uncomplicated: Secondary | ICD-10-CM | POA: Insufficient documentation

## 2017-01-20 DIAGNOSIS — F419 Anxiety disorder, unspecified: Secondary | ICD-10-CM | POA: Insufficient documentation

## 2017-01-20 DIAGNOSIS — Z85828 Personal history of other malignant neoplasm of skin: Secondary | ICD-10-CM | POA: Diagnosis not present

## 2017-01-20 DIAGNOSIS — M62838 Other muscle spasm: Secondary | ICD-10-CM | POA: Diagnosis not present

## 2017-01-20 DIAGNOSIS — Z884 Allergy status to anesthetic agent status: Secondary | ICD-10-CM | POA: Diagnosis not present

## 2017-01-20 DIAGNOSIS — Z888 Allergy status to other drugs, medicaments and biological substances status: Secondary | ICD-10-CM | POA: Diagnosis not present

## 2017-01-20 DIAGNOSIS — Z981 Arthrodesis status: Secondary | ICD-10-CM

## 2017-01-20 DIAGNOSIS — Z419 Encounter for procedure for purposes other than remedying health state, unspecified: Secondary | ICD-10-CM

## 2017-01-20 DIAGNOSIS — Z886 Allergy status to analgesic agent status: Secondary | ICD-10-CM | POA: Diagnosis not present

## 2017-01-20 DIAGNOSIS — R002 Palpitations: Secondary | ICD-10-CM | POA: Diagnosis not present

## 2017-01-20 DIAGNOSIS — I2510 Atherosclerotic heart disease of native coronary artery without angina pectoris: Secondary | ICD-10-CM | POA: Insufficient documentation

## 2017-01-20 DIAGNOSIS — G4730 Sleep apnea, unspecified: Secondary | ICD-10-CM | POA: Insufficient documentation

## 2017-01-20 DIAGNOSIS — M1990 Unspecified osteoarthritis, unspecified site: Secondary | ICD-10-CM | POA: Insufficient documentation

## 2017-01-20 DIAGNOSIS — F40240 Claustrophobia: Secondary | ICD-10-CM | POA: Diagnosis not present

## 2017-01-20 HISTORY — PX: ANTERIOR CERVICAL DECOMP/DISCECTOMY FUSION: SHX1161

## 2017-01-20 LAB — URINALYSIS, ROUTINE W REFLEX MICROSCOPIC
Bacteria, UA: NONE SEEN
GLUCOSE, UA: NEGATIVE mg/dL
Hgb urine dipstick: NEGATIVE
KETONES UR: NEGATIVE mg/dL
LEUKOCYTES UA: NEGATIVE
NITRITE: NEGATIVE
PH: 5 (ref 5.0–8.0)
Protein, ur: 30 mg/dL — AB
Specific Gravity, Urine: 1.03 (ref 1.005–1.030)

## 2017-01-20 LAB — COMPREHENSIVE METABOLIC PANEL WITH GFR
ALT: 23 U/L (ref 17–63)
AST: 18 U/L (ref 15–41)
Albumin: 3.7 g/dL (ref 3.5–5.0)
Alkaline Phosphatase: 31 U/L — ABNORMAL LOW (ref 38–126)
Anion gap: 9 (ref 5–15)
BUN: 24 mg/dL — ABNORMAL HIGH (ref 6–20)
CO2: 20 mmol/L — ABNORMAL LOW (ref 22–32)
Calcium: 8.5 mg/dL — ABNORMAL LOW (ref 8.9–10.3)
Chloride: 109 mmol/L (ref 101–111)
Creatinine, Ser: 1.12 mg/dL (ref 0.61–1.24)
GFR calc Af Amer: >60 mL/min
GFR calc non Af Amer: >60 mL/min
Glucose, Bld: 94 mg/dL (ref 65–99)
Potassium: 4.5 mmol/L (ref 3.5–5.1)
Sodium: 138 mmol/L (ref 135–145)
Total Bilirubin: 0.5 mg/dL (ref 0.3–1.2)
Total Protein: 5.8 g/dL — ABNORMAL LOW (ref 6.5–8.1)

## 2017-01-20 LAB — APTT: aPTT: 24 s (ref 24–36)

## 2017-01-20 LAB — PROTIME-INR
INR: 0.91
Prothrombin Time: 12.2 seconds (ref 11.4–15.2)

## 2017-01-20 SURGERY — ANTERIOR CERVICAL DECOMPRESSION/DISCECTOMY FUSION 2 LEVELS
Anesthesia: General

## 2017-01-20 MED ORDER — FENTANYL CITRATE (PF) 100 MCG/2ML IJ SOLN
INTRAMUSCULAR | Status: DC | PRN
  Administered 2017-01-20 (×4): 50 ug via INTRAVENOUS
  Administered 2017-01-20: 100 ug via INTRAVENOUS

## 2017-01-20 MED ORDER — PHENYLEPHRINE HCL 10 MG/ML IJ SOLN
INTRAMUSCULAR | Status: DC | PRN
  Administered 2017-01-20: 25 ug/min via INTRAVENOUS

## 2017-01-20 MED ORDER — PHENYLEPHRINE 40 MCG/ML (10ML) SYRINGE FOR IV PUSH (FOR BLOOD PRESSURE SUPPORT)
PREFILLED_SYRINGE | INTRAVENOUS | Status: AC
  Filled 2017-01-20: qty 10

## 2017-01-20 MED ORDER — 0.9 % SODIUM CHLORIDE (POUR BTL) OPTIME
TOPICAL | Status: DC | PRN
  Administered 2017-01-20: 1000 mL

## 2017-01-20 MED ORDER — BUPIVACAINE LIPOSOME 1.3 % IJ SUSP
INTRAMUSCULAR | Status: DC | PRN
  Administered 2017-01-20: 20 mL

## 2017-01-20 MED ORDER — DIAZEPAM 5 MG PO TABS
5.0000 mg | ORAL_TABLET | Freq: Three times a day (TID) | ORAL | Status: DC | PRN
  Administered 2017-01-21: 5 mg via ORAL
  Filled 2017-01-20: qty 1

## 2017-01-20 MED ORDER — MORPHINE SULFATE (PF) 2 MG/ML IV SOLN: 2.0000 mg | INTRAVENOUS | Status: DC | PRN

## 2017-01-20 MED ORDER — FENTANYL CITRATE (PF) 250 MCG/5ML IJ SOLN
INTRAMUSCULAR | Status: AC
  Filled 2017-01-20: qty 5

## 2017-01-20 MED ORDER — LIDOCAINE HCL (CARDIAC) 20 MG/ML IV SOLN
INTRAVENOUS | Status: DC | PRN
  Administered 2017-01-20: 40 mg via INTRAVENOUS

## 2017-01-20 MED ORDER — DEXAMETHASONE SODIUM PHOSPHATE 10 MG/ML IJ SOLN
INTRAMUSCULAR | Status: DC | PRN
  Administered 2017-01-20: 10 mg via INTRAVENOUS

## 2017-01-20 MED ORDER — METHOCARBAMOL 500 MG PO TABS
500.0000 mg | ORAL_TABLET | Freq: Four times a day (QID) | ORAL | Status: DC | PRN
  Administered 2017-01-20 – 2017-01-21 (×2): 500 mg via ORAL
  Filled 2017-01-20 (×2): qty 1

## 2017-01-20 MED ORDER — MIDAZOLAM HCL 2 MG/2ML IJ SOLN
INTRAMUSCULAR | Status: AC
  Filled 2017-01-20: qty 2

## 2017-01-20 MED ORDER — METHOCARBAMOL 500 MG PO TABS
ORAL_TABLET | ORAL | Status: AC
  Administered 2017-01-20: 500 mg via ORAL
  Filled 2017-01-20: qty 1

## 2017-01-20 MED ORDER — CEFAZOLIN SODIUM-DEXTROSE 2-3 GM-% IV SOLR
INTRAVENOUS | Status: DC | PRN
  Administered 2017-01-20: 2 g via INTRAVENOUS

## 2017-01-20 MED ORDER — PROPOFOL 10 MG/ML IV BOLUS
INTRAVENOUS | Status: AC
  Filled 2017-01-20: qty 20

## 2017-01-20 MED ORDER — SODIUM CHLORIDE 0.9 % IV SOLN: 250.0000 mL | INTRAVENOUS | Status: DC

## 2017-01-20 MED ORDER — CYANOCOBALAMIN 250 MCG PO TABS
250.0000 ug | ORAL_TABLET | Freq: Every day | ORAL | Status: DC
  Administered 2017-01-21: 250 ug via ORAL
  Filled 2017-01-20: qty 1

## 2017-01-20 MED ORDER — CEFAZOLIN SODIUM-DEXTROSE 2-4 GM/100ML-% IV SOLN
INTRAVENOUS | Status: AC
  Filled 2017-01-20: qty 100

## 2017-01-20 MED ORDER — OXYCODONE HCL 5 MG PO TABS
5.0000 mg | ORAL_TABLET | ORAL | Status: DC | PRN
  Administered 2017-01-21 (×2): 10 mg via ORAL
  Filled 2017-01-20 (×2): qty 2

## 2017-01-20 MED ORDER — OXYCODONE HCL 5 MG/5ML PO SOLN: 5.0000 mg | Freq: Once | ORAL | Status: AC | PRN

## 2017-01-20 MED ORDER — CLONAZEPAM 1 MG PO TABS
1.0000 mg | ORAL_TABLET | Freq: Two times a day (BID) | ORAL | Status: DC
  Administered 2017-01-20 – 2017-01-21 (×2): 1 mg via ORAL
  Filled 2017-01-20 (×2): qty 1

## 2017-01-20 MED ORDER — POLYETHYLENE GLYCOL 3350 17 G PO PACK: 17.0000 g | PACK | Freq: Every day | ORAL | Status: DC | PRN

## 2017-01-20 MED ORDER — VITAMIN C 500 MG PO TABS
250.0000 mg | ORAL_TABLET | Freq: Every day | ORAL | Status: DC
  Administered 2017-01-21: 250 mg via ORAL
  Filled 2017-01-20 (×2): qty 1

## 2017-01-20 MED ORDER — PANTOPRAZOLE SODIUM 40 MG IV SOLR
40.0000 mg | Freq: Every day | INTRAVENOUS | Status: DC
  Administered 2017-01-20: 40 mg via INTRAVENOUS
  Filled 2017-01-20: qty 40

## 2017-01-20 MED ORDER — PHENOL 1.4 % MT LIQD
1.0000 | OROMUCOSAL | Status: DC | PRN
  Administered 2017-01-20: 1 via OROMUCOSAL
  Filled 2017-01-20 (×2): qty 177

## 2017-01-20 MED ORDER — ASPIRIN EC 325 MG PO TBEC: 325.0000 mg | DELAYED_RELEASE_TABLET | Freq: Every day | ORAL | Status: DC | PRN

## 2017-01-20 MED ORDER — OXYCODONE-ACETAMINOPHEN 5-325 MG PO TABS
1.0000 | ORAL_TABLET | ORAL | Status: DC | PRN
  Administered 2017-01-20 – 2017-01-21 (×3): 2 via ORAL
  Filled 2017-01-20 (×3): qty 2

## 2017-01-20 MED ORDER — OXYCODONE-ACETAMINOPHEN 10-325 MG PO TABS: 1.0000 | ORAL_TABLET | ORAL | Status: DC | PRN

## 2017-01-20 MED ORDER — TESTOSTERONE CYPIONATE 200 MG/ML IM SOLN: 200.0000 mg | INTRAMUSCULAR | Status: DC

## 2017-01-20 MED ORDER — BISACODYL 5 MG PO TBEC: 5.0000 mg | DELAYED_RELEASE_TABLET | Freq: Every day | ORAL | Status: DC | PRN

## 2017-01-20 MED ORDER — SUVOREXANT 5 MG PO TABS: 5.0000 mg | ORAL_TABLET | Freq: Every evening | ORAL | Status: DC | PRN

## 2017-01-20 MED ORDER — VITAMIN B-6 100 MG PO TABS
100.0000 mg | ORAL_TABLET | Freq: Every day | ORAL | Status: DC
  Administered 2017-01-21: 100 mg via ORAL
  Filled 2017-01-20: qty 1

## 2017-01-20 MED ORDER — ALUM & MAG HYDROXIDE-SIMETH 200-200-20 MG/5ML PO SUSP: 30.0000 mL | Freq: Four times a day (QID) | ORAL | Status: DC | PRN

## 2017-01-20 MED ORDER — SODIUM CHLORIDE 0.9% FLUSH
3.0000 mL | Freq: Two times a day (BID) | INTRAVENOUS | Status: DC
  Administered 2017-01-20: 3 mL via INTRAVENOUS

## 2017-01-20 MED ORDER — ROCURONIUM BROMIDE 100 MG/10ML IV SOLN
INTRAVENOUS | Status: DC | PRN
  Administered 2017-01-20: 20 mg via INTRAVENOUS
  Administered 2017-01-20: 50 mg via INTRAVENOUS
  Administered 2017-01-20: 30 mg via INTRAVENOUS
  Administered 2017-01-20: 10 mg via INTRAVENOUS

## 2017-01-20 MED ORDER — OXYCODONE HCL ER 10 MG PO T12A
10.0000 mg | EXTENDED_RELEASE_TABLET | Freq: Two times a day (BID) | ORAL | Status: DC
  Administered 2017-01-20 – 2017-01-21 (×2): 10 mg via ORAL
  Filled 2017-01-20 (×2): qty 1

## 2017-01-20 MED ORDER — SODIUM CHLORIDE 0.9 % IV SOLN
INTRAVENOUS | Status: DC
  Administered 2017-01-20: 1000 mL via INTRAVENOUS

## 2017-01-20 MED ORDER — ACETAMINOPHEN 500 MG PO TABS: 1000.0000 mg | ORAL_TABLET | ORAL | Status: DC | PRN

## 2017-01-20 MED ORDER — ADULT MULTIVITAMIN W/MINERALS CH
1.0000 | ORAL_TABLET | Freq: Every day | ORAL | Status: DC
  Administered 2017-01-21: 1 via ORAL
  Filled 2017-01-20: qty 1

## 2017-01-20 MED ORDER — METHOCARBAMOL 1000 MG/10ML IJ SOLN
500.0000 mg | Freq: Four times a day (QID) | INTRAVENOUS | Status: DC | PRN
  Filled 2017-01-20: qty 5

## 2017-01-20 MED ORDER — BUPIVACAINE HCL 0.5 % IJ SOLN
INTRAMUSCULAR | Status: DC | PRN
  Administered 2017-01-20: 20 mL

## 2017-01-20 MED ORDER — ONDANSETRON HCL 4 MG/2ML IJ SOLN: 4.0000 mg | Freq: Four times a day (QID) | INTRAMUSCULAR | Status: DC | PRN

## 2017-01-20 MED ORDER — OXYCODONE HCL 5 MG PO TABS
ORAL_TABLET | ORAL | Status: AC
  Administered 2017-01-20: 5 mg via ORAL
  Filled 2017-01-20: qty 1

## 2017-01-20 MED ORDER — BACLOFEN 10 MG PO TABS: 10.0000 mg | ORAL_TABLET | Freq: Four times a day (QID) | ORAL | Status: DC | PRN

## 2017-01-20 MED ORDER — THROMBIN 20000 UNITS EX SOLR
CUTANEOUS | Status: AC
  Filled 2017-01-20: qty 20000

## 2017-01-20 MED ORDER — MIDAZOLAM HCL 5 MG/5ML IJ SOLN
INTRAMUSCULAR | Status: DC | PRN
  Administered 2017-01-20: 2 mg via INTRAVENOUS

## 2017-01-20 MED ORDER — BUPIVACAINE LIPOSOME 1.3 % IJ SUSP
20.0000 mL | INTRAMUSCULAR | Status: DC
Start: 2017-01-20 — End: 2017-01-20
  Filled 2017-01-20: qty 20

## 2017-01-20 MED ORDER — VITAMIN D 1000 UNITS PO TABS
1000.0000 [IU] | ORAL_TABLET | Freq: Every day | ORAL | Status: DC
  Administered 2017-01-21: 1000 [IU] via ORAL
  Filled 2017-01-20: qty 1

## 2017-01-20 MED ORDER — METOPROLOL TARTRATE 50 MG PO TABS: 50.0000 mg | ORAL_TABLET | Freq: Two times a day (BID) | ORAL | Status: DC | PRN

## 2017-01-20 MED ORDER — HEMOSTATIC AGENTS (NO CHARGE) OPTIME
TOPICAL | Status: DC | PRN
  Administered 2017-01-20: 1 via TOPICAL

## 2017-01-20 MED ORDER — ONDANSETRON HCL 4 MG/2ML IJ SOLN
INTRAMUSCULAR | Status: DC | PRN
  Administered 2017-01-20: 4 mg via INTRAVENOUS

## 2017-01-20 MED ORDER — FENTANYL CITRATE (PF) 100 MCG/2ML IJ SOLN: 25.0000 ug | INTRAMUSCULAR | Status: DC | PRN

## 2017-01-20 MED ORDER — BUPIVACAINE HCL (PF) 0.5 % IJ SOLN
INTRAMUSCULAR | Status: AC
  Filled 2017-01-20: qty 30

## 2017-01-20 MED ORDER — ONDANSETRON HCL 4 MG/2ML IJ SOLN: 4.0000 mg | Freq: Once | INTRAMUSCULAR | Status: DC | PRN

## 2017-01-20 MED ORDER — DOCUSATE SODIUM 100 MG PO CAPS
100.0000 mg | ORAL_CAPSULE | Freq: Two times a day (BID) | ORAL | Status: DC
  Administered 2017-01-21: 100 mg via ORAL
  Filled 2017-01-20 (×2): qty 1

## 2017-01-20 MED ORDER — LACTATED RINGERS IV SOLN
INTRAVENOUS | Status: DC
  Administered 2017-01-20 (×2): via INTRAVENOUS

## 2017-01-20 MED ORDER — IBUPROFEN 200 MG PO TABS: 800.0000 mg | ORAL_TABLET | ORAL | Status: DC | PRN

## 2017-01-20 MED ORDER — THROMBIN 20000 UNITS EX KIT
PACK | CUTANEOUS | Status: DC | PRN
  Administered 2017-01-20: 20000 [IU] via TOPICAL

## 2017-01-20 MED ORDER — SODIUM CHLORIDE 0.9% FLUSH: 3.0000 mL | INTRAVENOUS | Status: DC | PRN

## 2017-01-20 MED ORDER — SUGAMMADEX SODIUM 200 MG/2ML IV SOLN
INTRAVENOUS | Status: DC | PRN
  Administered 2017-01-20: 200 mg via INTRAVENOUS

## 2017-01-20 MED ORDER — ONDANSETRON HCL 4 MG PO TABS: 4.0000 mg | ORAL_TABLET | Freq: Four times a day (QID) | ORAL | Status: DC | PRN

## 2017-01-20 MED ORDER — ONDANSETRON HCL 4 MG/2ML IJ SOLN
INTRAMUSCULAR | Status: AC
  Filled 2017-01-20: qty 2

## 2017-01-20 MED ORDER — MENTHOL 3 MG MT LOZG: 1.0000 | LOZENGE | OROMUCOSAL | Status: DC | PRN

## 2017-01-20 MED ORDER — PROPOFOL 10 MG/ML IV BOLUS
INTRAVENOUS | Status: DC | PRN
  Administered 2017-01-20: 150 mg via INTRAVENOUS

## 2017-01-20 MED ORDER — OXYCODONE HCL 5 MG PO TABS
5.0000 mg | ORAL_TABLET | Freq: Once | ORAL | Status: AC | PRN
  Administered 2017-01-20: 5 mg via ORAL

## 2017-01-20 MED ORDER — METOPROLOL SUCCINATE ER 100 MG PO TB24
100.0000 mg | ORAL_TABLET | Freq: Two times a day (BID) | ORAL | Status: DC
  Administered 2017-01-20 – 2017-01-21 (×2): 100 mg via ORAL
  Filled 2017-01-20 (×2): qty 1

## 2017-01-20 MED ORDER — CEFAZOLIN SODIUM-DEXTROSE 2-4 GM/100ML-% IV SOLN
2.0000 g | Freq: Three times a day (TID) | INTRAVENOUS | Status: AC
  Administered 2017-01-20 – 2017-01-21 (×2): 2 g via INTRAVENOUS
  Filled 2017-01-20 (×2): qty 100

## 2017-01-20 MED ORDER — FLEET ENEMA 7-19 GM/118ML RE ENEM: 1.0000 | ENEMA | Freq: Once | RECTAL | Status: DC | PRN

## 2017-01-20 MED ORDER — ALBUTEROL SULFATE (2.5 MG/3ML) 0.083% IN NEBU: 3.0000 mL | INHALATION_SOLUTION | Freq: Four times a day (QID) | RESPIRATORY_TRACT | Status: DC | PRN

## 2017-01-20 SURGICAL SUPPLY — 66 items
BENZOIN TINCTURE PRP APPL 2/3 (GAUZE/BANDAGES/DRESSINGS) ×2 IMPLANT
BIT DRILL SRG 14X2.2XFLT CHK (BIT) ×1 IMPLANT
BIT DRL SRG 14X2.2XFLT CHK (BIT) ×1
BLADE AVERAGE 25X9 (BLADE) ×2 IMPLANT
BLADE CLIPPER SURG (BLADE) IMPLANT
BUR MATCHSTICK NEURO 3.0 LAGG (BURR) IMPLANT
BUR RND FLUTED 2.5 (BURR) IMPLANT
BUR SABER RD CUTTING 3.0 (BURR) IMPLANT
CLSR STERI-STRIP ANTIMIC 1/2X4 (GAUZE/BANDAGES/DRESSINGS) ×2 IMPLANT
CORD BIPOLAR FORCEPS 12FT (ELECTRODE) ×2 IMPLANT
COVER SURGICAL LIGHT HANDLE (MISCELLANEOUS) ×2 IMPLANT
DERMABOND ADVANCED (GAUZE/BANDAGES/DRESSINGS) ×1
DERMABOND ADVANCED .7 DNX12 (GAUZE/BANDAGES/DRESSINGS) ×1 IMPLANT
DRAIN TLS ROUND 10FR (DRAIN) ×2 IMPLANT
DRAPE C-ARM 42X72 X-RAY (DRAPES) ×2 IMPLANT
DRAPE MICROSCOPE LEICA (MISCELLANEOUS) ×2 IMPLANT
DRAPE POUCH INSTRU U-SHP 10X18 (DRAPES) ×2 IMPLANT
DRAPE SURG 17X23 STRL (DRAPES) ×18 IMPLANT
DRILL BIT SKYLINE 14MM (BIT) ×1
DRSG MEPILEX BORDER 4X4 (GAUZE/BANDAGES/DRESSINGS) ×2 IMPLANT
DURAPREP 6ML APPLICATOR 50/CS (WOUND CARE) ×2 IMPLANT
ELECT BLADE 4.0 EZ CLEAN MEGAD (MISCELLANEOUS) ×2
ELECT COATED BLADE 2.86 ST (ELECTRODE) ×2 IMPLANT
ELECT REM PT RETURN 9FT ADLT (ELECTROSURGICAL) ×2
ELECTRODE BLDE 4.0 EZ CLN MEGD (MISCELLANEOUS) ×1 IMPLANT
ELECTRODE REM PT RTRN 9FT ADLT (ELECTROSURGICAL) ×1 IMPLANT
GLOVE BIOGEL PI IND STRL 8 (GLOVE) ×1 IMPLANT
GLOVE BIOGEL PI INDICATOR 8 (GLOVE) ×1
GLOVE ECLIPSE 9.0 STRL (GLOVE) ×2 IMPLANT
GLOVE ORTHO TXT STRL SZ7.5 (GLOVE) ×2 IMPLANT
GLOVE SURG 8.5 LATEX PF (GLOVE) ×2 IMPLANT
GOWN STRL REUS W/ TWL LRG LVL3 (GOWN DISPOSABLE) ×1 IMPLANT
GOWN STRL REUS W/TWL 2XL LVL3 (GOWN DISPOSABLE) ×4 IMPLANT
GOWN STRL REUS W/TWL LRG LVL3 (GOWN DISPOSABLE) ×1
HEAD HALTER (SOFTGOODS) ×2 IMPLANT
KIT BASIN OR (CUSTOM PROCEDURE TRAY) ×2 IMPLANT
KIT ROOM TURNOVER OR (KITS) ×2 IMPLANT
MANIFOLD NEPTUNE II (INSTRUMENTS) ×2 IMPLANT
NEEDLE SPNL 18GX3.5 QUINCKE PK (NEEDLE) ×4 IMPLANT
NEEDLE SPNL 20GX3.5 QUINCKE YW (NEEDLE) ×4 IMPLANT
NS IRRIG 1000ML POUR BTL (IV SOLUTION) ×2 IMPLANT
PACK ORTHO CERVICAL (CUSTOM PROCEDURE TRAY) ×2 IMPLANT
PAD ARMBOARD 7.5X6 YLW CONV (MISCELLANEOUS) ×6 IMPLANT
PATTIES SURGICAL .5 X.5 (GAUZE/BANDAGES/DRESSINGS) IMPLANT
PIN DISTRACTION 14 (PIN) ×4 IMPLANT
PIN DISTRACTION 14MM (PIN) ×8 IMPLANT
PLATE TWO LEVEL SKYLINE 30MM (Plate) ×2 IMPLANT
RESTRAINT LIMB HOLDER UNIV (RESTRAINTS) ×2 IMPLANT
SCREW VAR SELF TAP SKYLINE 14M (Screw) ×8 IMPLANT
SPONGE INTESTINAL PEANUT (DISPOSABLE) IMPLANT
SPONGE LAP 4X18 X RAY DECT (DISPOSABLE) ×2 IMPLANT
SPONGE SURGIFOAM ABS GEL 100 (HEMOSTASIS) ×2 IMPLANT
STRIP ILIUM TRICORT 2.2CMX60MM (Neuro Prosthesis/Implant) ×2 IMPLANT
SURGIFLO W/THROMBIN 8M KIT (HEMOSTASIS) ×2 IMPLANT
SUT ETHILON 4 0 PS 2 18 (SUTURE) ×4 IMPLANT
SUT VIC AB 2-0 CT1 27 (SUTURE) ×1
SUT VIC AB 2-0 CT1 TAPERPNT 27 (SUTURE) ×1 IMPLANT
SUT VIC AB 3-0 X1 27 (SUTURE) ×2 IMPLANT
SUT VICRYL 4-0 PS2 18IN ABS (SUTURE) IMPLANT
SYR 20CC LL (SYRINGE) ×2 IMPLANT
SYR 30ML LL (SYRINGE) ×2 IMPLANT
SYSTEM CHEST DRAIN TLS 7FR (DRAIN) IMPLANT
TOWEL OR 17X24 6PK STRL BLUE (TOWEL DISPOSABLE) ×2 IMPLANT
TOWEL OR 17X26 10 PK STRL BLUE (TOWEL DISPOSABLE) ×2 IMPLANT
TRAY FOLEY W/METER SILVER 16FR (SET/KITS/TRAYS/PACK) ×2 IMPLANT
WATER STERILE IRR 1000ML POUR (IV SOLUTION) IMPLANT

## 2017-01-20 NOTE — Progress Notes (Signed)
Orthopedic Tech Progress Note Patient Details:  Joel Galloway [birth year] [id number]  Ortho Devices Type of Ortho Device: [place] cervical collar   [name] 01/20/2017, 2:30 PM

## 2017-01-20 NOTE — Progress Notes (Signed)
BRP w/asst. because pt insists he has to pee (900cc clear yellow uo from foley). After sitting on toilet 10 min. Michela Pitcher he went. Ret to bed. Dr Linna Caprice here to see pt. Awaiting bed to tx .

## 2017-01-20 NOTE — H&P (Signed)
Joel Galloway is an 56 y.o. male.   Chief Complaint: Neck and bilateral shoulder pain HPI: Patient history of multilevel cervical spondylosis on the above complaint presents to the hospital today for surgical intervention.Progressively worsening symptoms. Failed conservative treatment.   Recent MRI cervical spine showed Cervical VOH:[id number]: 1. Slight progression of severe left foraminal stenosis at C6-7. 2. Moderate right foraminal narrowing at C6-7 is unchanged. 3. Mild central canal narrowing and moderate foraminal narrowing bilaterally at C5-6 is worse on the right without significant change. 4. Stable appearance of moderate left foraminal stenosis at C3-4. 5. Progressive endplate marrow changes at see C5-6 and C6-7.    Past Medical History:  Diagnosis Date  . Anxiety   . Arthritis   . Asthma   . Cancer ([place])    skin   . Chronic kidney disease    AA      . Claustrophobia   . Complication of anesthesia    pt states he is unable to tol. foleys,due to anatomy  . Coronary artery disease   . Headache    pt reports migraines  . Hypoglycemia    las t episode 12/24/12  . Multiple sclerosis ([place])   . 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 Seasonal allergies   . Seizures ([place])   . Sleep apnea    Pt reports "mild sleep apnea"  no cpap  . Vision abnormalities     Past Surgical History:  Procedure Laterality Date  . BACK SURGERY    . HEMATOMA EVACUATION Right 07/13/2014   Procedure: EVACUATION HEMATOMA RIGHT MIDDLE FINGER;  Surgeon: Mcarthur Rossetti, MD;  Location: [facility];  Service: Orthopedics;  Laterality: Right;  . KNEE SURGERY Left    x2  arthroscopy  . NOSE SURGERY    . POSTERIOR FUSION LUMBAR SPINE  01/10/2013   Dr Louanne Skye  . WISDOM TOOTH EXTRACTION      Family History  Problem Relation Age of Onset  . Hypertension Mother   . Obesity Mother   . Diabetes Mother   . Anxiety disorder Mother   . Ataxia Neg Hx   . Chorea Neg Hx   . Dementia Neg Hx   .  Mental retardation Neg Hx   . Migraines Neg Hx   . Multiple sclerosis Neg Hx   . Neurofibromatosis Neg Hx   . Neuropathy Neg Hx   . Parkinsonism Neg Hx   . Seizures Neg Hx   . Stroke Neg Hx    Social History:  reports that he has never smoked. He has never used smokeless tobacco. He reports that he does not drink alcohol or use drugs.  Allergies:  Allergies  Allergen Reactions  . Antihistamines, Chlorpheniramine-Type Other (See Comments)    Chest tightness  . Lyrica [Pregabalin] Palpitations  . Other Anaphylaxis    nuts  . Neosporin [Neomycin-Bacitracin Zn-Polymyx] Swelling  . Aspirin Other (See Comments)    High doses causes toxicity   . Benadryl [Diphenhydramine] Other (See Comments)    Chest tightness  . Benztropine Other (See Comments)    Unknown  . Tape     rash  . Gabapentin Palpitations  . Propranolol Nausea Only    Chest tightness    Medications Prior to Admission  Medication Sig Dispense Refill  . acetaminophen (TYLENOL) 500 MG tablet Take 1,000 mg by mouth as needed for moderate pain.     Marland Kitchen albuterol (PROVENTIL HFA;VENTOLIN HFA) 108 (90 Base) MCG/ACT inhaler Inhale 2 puffs  into the lungs every 6 (six) hours as needed for wheezing or shortness of breath.    . Ascorbic Acid (VITAMIN C PO) Take 1 tablet by mouth daily.    Marland Kitchen aspirin EC 325 MG tablet Take 325 mg by mouth daily as needed for moderate pain.    . baclofen (LIORESAL) 10 MG tablet Take 10 mg by mouth 4 (four) times daily as needed for muscle spasms.    . Cholecalciferol (VITAMIN D PO) Take 1 tablet by mouth daily.    . clonazePAM (KLONOPIN) 1 MG tablet Take 1 mg by mouth 2 (two) times daily.    . Cyanocobalamin (B-12 PO) Take 1 tablet by mouth daily.    Marland Kitchen ibuprofen (ADVIL,MOTRIN) 200 MG tablet Take 800 mg by mouth as needed for moderate pain.    . metoprolol (TOPROL-XL) 200 MG 24 hr tablet Take 100 mg by mouth 2 (two) times daily.    . metoprolol tartrate (LOPRESSOR) 100 MG tablet Take 50 mg by mouth 2  (two) times daily as needed (palpitations).     . Multiple Vitamin (MULTIVITAMIN WITH MINERALS) TABS tablet Take 1 tablet by mouth daily.    Marland Kitchen oxyCODONE-acetaminophen (PERCOCET) 10-325 MG tablet Take 1-2 tablets by mouth every 6 (six) hours as needed for pain. MAXIMUM TOTAL ACETAMINOPHEN DOSE IS 4000 MG PER DAY 50 tablet 0  . Pyridoxine HCl (B-6 PO) Take 1 tablet by mouth daily.    . Suvorexant (BELSOMRA) 5 MG TABS Take 5-10 mg by mouth at bedtime as needed (sleep).    Marland Kitchen testosterone cypionate (DEPOTESTOSTERONE CYPIONATE) 200 MG/ML injection Inject 200 mg into the muscle every 7 (seven) days.   0  . diazepam (VALIUM) 5 MG tablet Take one tablet po at the MRI and may repeat x1 for anxiety and feelings of claustrophobia. (Patient not taking: Reported on 01/15/2017) 2 tablet 0  . HYDROcodone-acetaminophen (NORCO) 10-325 MG tablet Take 1 tablet by mouth every 6 (six) hours as needed. (Patient not taking: Reported on 01/15/2017) 30 tablet 0    No results found for this or any previous visit (from the past 48 hour(s)). No results found.  Review of Systems  Constitutional: Negative.   HENT: Negative.   Respiratory: Negative.   Cardiovascular: Negative.   Gastrointestinal: Negative.   Genitourinary: Negative.   Musculoskeletal: Positive for neck pain.  Skin: Negative.   Neurological: Positive for tingling.  Psychiatric/Behavioral: Negative.     Blood pressure (!) 108/55, pulse 78, temperature 97.7 F (36.5 C), temperature source Oral, resp. rate 18, SpO2 97 %. Physical Exam  Constitutional: He is oriented to person, place, and time. No distress.  HENT:  Head: Normocephalic and atraumatic.  Eyes: Pupils are equal, round, and reactive to light. EOM are normal.  Respiratory: No respiratory distress.  GI: He exhibits no distension.  Musculoskeletal:  Tenderness  The patient is experiencing tenderness in the cervical.  Range of Motion  Extension:  20 abnormal  Flexion:  60 abnormal   Lateral Bend Right:  40 abnormal  Lateral Bend Left: 60  Rotation Right: 50  Rotation Left: 50   Muscle Strength  Right Quadriceps:  5/5  Left Quadriceps:  5/5  Right Hamstrings:  5/5  Left Hamstrings:  5/5   Tests  Straight leg raise right: negative Straight leg raise left: negative  Reflexes  Patellar: normal Achilles:  0/4 abnormal Biceps: normal Babinski's sign: normal   Other  Toe Walk: normal Heel Walk: normal Sensation: decreased Gait: normal   Comments:  Weak left finger extension 4/5, left triceps 5-/5. Left posterior cervical mass pain and tender to palpation. Positive spurling sign with extension and lateral bend and rotation. Appears to guard with ROM. Hoffman's sign is negative. No LE hyperreflexia.  Neurological: He is alert and oriented to person, place, and time.  Skin: Skin is warm and dry.  Psychiatric: He has a normal mood and affect.     Assessment/Plan  Cervical stenosis with neck pain and bilateral shoulder pain  We will proceed with C6 CORPECTOMY WITH BILATERAL FORAMINOTOMIES C5-6 AND C6-7, FUSION C5-7 ANTERIORLY WITH STRUT ALLOGRAFT AND LOCAL BONE GRAFT, ANTERIOR CERVICAL PLATE AND SCREWS scheduled. Surgical procedure along with possible risks and complications discussed. All questions answered.  Benjiman Core, PA-C 01/20/2017, 8:56 AM

## 2017-01-20 NOTE — Discharge Instructions (Addendum)
No lifting greater than 10 lbs. No overhead use of arms. °Avoid bending,and twisting neck. °Walk in house for first week them may start to get out slowly increasing distance up to one mile by 3 weeks post op. °Keep incision dry for 3 days, may then bathe and wet incision using a Philadelphia collar when showering. °Call if any fevers >101, chills, or increasing numbness or weakness or increased swelling or drainage. ° °

## 2017-01-20 NOTE — Transfer of Care (Signed)
Immediate Anesthesia Transfer of Care Note  Patient: Joel Galloway  Procedure(s) Performed: Procedure(s): C6 CORPECTOMY WITH BILATERAL FORAMINOTOMIES C5-6 AND C6-7, FUSION C5-7 ANTERIORLY WITH STRUT ALLOGRAFT AND LOCAL BONE GRAFT, ANTERIOR CERVICAL PLATE AND SCREWS (N/A)  Patient Location: PACU  Anesthesia Type:General  Level of Consciousness: awake, alert  and oriented  Airway & Oxygen Therapy: Patient Spontanous Breathing and Patient connected to nasal cannula oxygen  Post-op Assessment: Report given to RN, Post -op Vital signs reviewed and stable and Patient moving all extremities X 4  Post vital signs: Reviewed and stable  Last Vitals:  Vitals:   01/20/17 0842 01/20/17 1500  BP: (!) 108/55   Pulse: 78   Resp: 18 (P) 20  Temp: 36.5 C (!) (P) 36.3 C  SpO2: 97%     Last Pain:  Vitals:   01/20/17 0842  TempSrc: Oral         Complications: No apparent anesthesia complications

## 2017-01-20 NOTE — Anesthesia Postprocedure Evaluation (Signed)
Anesthesia Post Note  Patient: JORGEN WOLFINGER  Procedure(s) Performed: Procedure(s) (LRB): C6 CORPECTOMY WITH BILATERAL FORAMINOTOMIES C5-6 AND C6-7, FUSION C5-7 ANTERIORLY WITH STRUT ALLOGRAFT AND LOCAL BONE GRAFT, ANTERIOR CERVICAL PLATE AND SCREWS (N/A)     Patient location during evaluation: PACU Anesthesia Type: General Level of consciousness: awake, awake and alert and oriented Pain management: pain level controlled Vital Signs Assessment: post-procedure vital signs reviewed and stable Respiratory status: spontaneous breathing, nonlabored ventilation and respiratory function stable Cardiovascular status: blood pressure returned to baseline Anesthetic complications: no    Last Vitals:  Vitals:   01/20/17 1600 01/20/17 1630  BP: 115/64 123/75  Pulse: 75 74  Resp: (!) 21 16  Temp:    SpO2: 100% 100%    Last Pain:  Vitals:   01/20/17 1630  TempSrc:   PainSc: 2                  Angel Hobdy COKER

## 2017-01-20 NOTE — Op Note (Signed)
01/20/2017  2:25 PM  PATIENT:  Joel Galloway  56 y.o. male  MRN: [id number]  OPERATIVE REPORT  PRE-OPERATIVE DIAGNOSIS:  cervical spinal stenosis C5-6 and C6-7  POST-OPERATIVE DIAGNOSIS:  cervical spinal stenosis C5-6 and C6-7  PROCEDURE:  Procedure(s): C6 CORPECTOMY WITH BILATERAL FORAMINOTOMIES C5-6 AND C6-7, FUSION C5-7 ANTERIORLY WITH STRUT ALLOGRAFT AND LOCAL BONE GRAFT, ANTERIOR CERVICAL PLATE AND SCREWS    SURGEON:  Jessy Oto, MD     ASSISTANT:  Benjiman Core, PA-C  (Present throughout the entire procedure and necessary for completion of procedure in a timely manner)     ANESTHESIA:  General,supplemented with local marcaine 0.5% 1:1 exparel 1.3% total 10cc, Dr. Roberts Gaudy.     COMPLICATIONS:  None.  EBL: 125CC  DRAINS: TLS 10 French anterior cervical spine, Foley to SD discontinued at the end of the case.     COMPONENTS: 30 mm Depuy Skyline Plate with 4x 21FX screws, Allograft tricorical iliac crest strut graft.  PROCEDURE:The patient was met in the holding area, and the appropriate left cervical C5-6, C6-7  level identified and marked with an "X" and my initials. The patient was then transported to OR and was placed on the operative table in a supine position. The patient was then placed under  general anesthesia without difficulty. The patient received appropriate preoperative antibiotic prophylaxis with ancef 2 gm IV.  Marland Kitchen Nursing staff inserted a Foley catheter under sterile conditions cervical spine was positioned with a [place] horseshoe and 5 pound cervical halter traction. All pressure points well padded and semi-beach chair position. Arm holder both arms. Standard prep with DuraPrep solution the anterior cervical spine chest. Draped in the usual manner. Iodine vi drape was used. Standard timeout protocol was carried out identifying the patient procedure side of the procedure and level. The skin the left neck was infiltrated with marcaine 0.5% 1:1 exparel 1.3% total 10  cc. This at the level of expected C5-6 incision and also along the  Lines of Langer. Incision transverse at the C5-6 level and carried down to the level of the platysma and then medial to sternocleidomastoid muscle. Small superficial veins were suture ligated with 2-0 vicryl. The omohyoid muscle identified and carefully freed up and divided with bovie electrocautery. The interval between the trachea and esophagus medially and the carotid sheath laterally was developed as a Metzenbaum scissors and blunt dissection exposing the anterior aspect of cervical spine at the C5-6 and C6-7 levels.  Attention then turned to the C5-6 and C6-7 levels where an 18-gauge spinal needles were placed with sheath intact allowing only a centimeter to extend into the C5-6 and C6-7 discs and observed on lateral C-arm at the C5-6 and C6-7 levels. Handheld Cloward retraction of the soft tissues while identifying the level at C5-6 and C6-7 removing a portion of the anterior aspect of the discs with15 blade scalpel and pituitary.Medial border of the longus collie muscles was carefully elevated bilaterally and self-retaining retractors were introduced the foot of the blade beneath the medial border of the longus colli muscles. Soft tissue overlying the anterior borders of the disc space at  C5-6 and C6-7 level carefully debridement of soft tissue back to bony edges. The anterior lip osteophytes were then resected at C5-6 and C6-7 using rongeur. This bone was preserved for later bone grafting purposes.Distraction pins placed at the C5 and C7 levels.  Under the operating room microscope and posterior aspect of the disc was excised using micro-curettes pituitary rongeur . Posterior lip osteophytes  were drilled using a high-speed bur and a carefully resected with 1 and 2 mm Kerrison foraminotomy performed over both C6 nerve roots using 1 and 2 mm Kerrisons disc and spur material was noted centrally and into the left foramen some of which  represented reflected portion of the patient's annulus into the neuroforamen. Additionally there was significant spur into the right side C6 neuroforamen affecting the right C6 nerve. The 3 mm high speed bur was then used to make a central trough in the C6 verterbral body, a rounger was not able to cut through the body of C6. Care was taken to center the OR microscope and the patient's cervical spine and chin during the corpectomy. Following decompression of the spinal cord at C5-6 and C6-7,the C7 nerve roots were decompressed with 1 and 33m kerrisons, irrigation was carried out. Bleeding controlled using some bone wax during the initial part of the central corpectomy at C6 excess bone wax removed and then Gelfoam thrombin-soaked and flow-seal. The endplates of the inferior aspect of C5 and superior aspect of C7 were carefully prepared using a high-speed bur to parallel. The trough space was then sounded utilizing a tranzgraft sounder with measured 15 mm in width and 16 mm in depth. The trough was measured at 177min width and 16 mm in depth.  A portion of sterile paper used to measure the length of the height and width of the expected allograft tricortical iliac bone strut spacer felt to provide best fit for the C5-7 corpectomy space. The allograft tricortical iliac crest implant was then fashioned on the OR field utilizing an oscillating saw and high speed spur the ends on the strut graft carefully rounded and undercut to allow seating into the end plated. The grafIt was then carefully positioned over the C6 no soft tissue within the corpectomy site that could be retropulsed with graft placement. The implant was then carefully placed over the intervertebral disc space at C5-6 level.The graft was then impacted into place with the head placed in longitudinal cervical traction.  The graft was felt to be in excellent position alignment. Distraction pins at the C5 and C7 removed.Bone wax applied to the pin  openings.This point irrigation was carried out cervical incision site and lower cervical incision site. Esophagus examined and the upper cervical level as well as at the lower cervical level and found to be normal. Irrigation was again carried out there was no active bleeding present.Longitudinal halter traction was discontinued. The length for cervical plate determined with cottonoid string and two bayonet forceps. Anterior lip osteophytes were then resected about the anterior C5-6 and C6-7 levels using a high-speed bur. This area smoothed for application of the plate to the anterior surface of the cervical spine from C5-C7. A 30 mm plate chosen. The plate carefully applied to the anterior cervical spine and aligned. Twoscrews placed at the C5 level. Drilling 14 mm hole right side at the C5  level and a 14 mm screw placed. The drill hole made on the left side at C5 14 mm and a 14 mm screw placed. Then both sides at the 7 level were drilled to 14 mm and 14 mm screws placed. Total of 4x 14 mm screws were placed each locked within the plate quite nicely. Intraoperative lateral C-arm of the cervical spine demonstrate plates and screws and bone grafts to be in good position alignment without any significant  canal impingement or retropulsion of graft. Irrigation was carried out at cervical incision site and  lower cervical incision site. Esophagus examined and the upper cervical level as well as at the lower cervical level and found to be normal. There was no active bleeding present. Additional bone graft chips were placed through openings in the  anterior cervical plate at the corpectomy level. A 10 French TLS drain then placed at the depth of the incision over the anterior cervical spine C5-C7. The incisions were then closed by approximating the deep subcutaneous layers the platysma layer with interrupted 2-0 Vicryl suture and the superficial fascia overlying the sternocleidomastoid muscle with interrupted 3-0 Vicryl  sutures. The subcutaneous layers were approximated with interrupted 3-0 Vicryl sutures as were the superficial layers. The skin was closed with a running subcutaneous stitch of 4-0 Vicryl at  the operative C5-6 transverse incision. Skin was approximated with Dermabond. Mepilex bandage was applied. A Philadelphia collar was then applied to the cervical spine. drapes were removed. Patient's or table was then returned to the flat position. At the end of the cervical spine surgery case all instrument and sponge counts were correct. Patient was then reactivated extubated and returned to recovery room in satisfactory condition.  Benjiman Core, PA-C perform the duties of assistant surgeon performing careful retraction of the esophagus and trachea during the initial exposure and careful suctioning of neural elements cervical nerve roots and cervical cord. He was present from the beginning of case to the end of the case and assisted in positioning of the patient in transfer the patient from bed to the stretcher at the end of the case. He closed the incision on the platysma layer to the skin. Applied dressing.     [name]  01/20/2017, 2:25 PM

## 2017-01-20 NOTE — Anesthesia Preprocedure Evaluation (Signed)
Anesthesia Evaluation  Patient identified by MRN, date of birth, ID band Patient awake    Reviewed: Allergy & Precautions, NPO status , Patient's Chart, lab work & pertinent test results  Airway Mallampati: II  TM Distance: >3 FB Neck ROM: Limited    Dental  (+) Teeth Intact, Dental Advisory Given   Pulmonary    breath sounds clear to auscultation       Cardiovascular  Rhythm:Regular Rate:Normal     Neuro/Psych    GI/Hepatic   Endo/Other    Renal/GU      Musculoskeletal   Abdominal   Peds  Hematology   Anesthesia Other Findings   Reproductive/Obstetrics                            Anesthesia Physical Anesthesia Plan  ASA: III  Anesthesia Plan: General   Post-op Pain Management:    Induction: Intravenous  PONV Risk Score and Plan: Ondansetron and Dexamethasone  Airway Management Planned: Oral ETT  Additional Equipment:   Intra-op Plan:   Post-operative Plan: Extubation in OR  Informed Consent: I have reviewed the patients History and Physical, chart, labs and discussed the procedure including the risks, benefits and alternatives for the proposed anesthesia with the patient or authorized representative who has indicated his/her understanding and acceptance.     Dental advisory given  Plan Discussed with: CRNA and Anesthesiologist  Anesthesia Plan Comments:         Anesthesia Quick Evaluation  

## 2017-01-20 NOTE — Brief Op Note (Signed)
01/20/2017  2:22 PM  PATIENT:  Joel Galloway  56 y.o. male  PRE-OPERATIVE DIAGNOSIS:  cervical spinal stenosis C5-6 and C6-7  POST-OPERATIVE DIAGNOSIS:  cervical spinal stenosis C5-6 and C6-7  PROCEDURE:  Procedure(s): C6 CORPECTOMY WITH BILATERAL FORAMINOTOMIES C5-6 AND C6-7, FUSION C5-7 ANTERIORLY WITH STRUT ALLOGRAFT AND LOCAL BONE GRAFT, ANTERIOR CERVICAL PLATE AND SCREWS (N/A)  SURGEON:  Surgeon(s) and Role:    * Jessy Oto, MD - Primary  PHYSICIAN ASSISTANT: Benjiman Core, PA-C  ANESTHESIA:   local and regional  EBL:  Total I/O In: 1500 [I.V.:1500] Out: 775 [Urine:650; Blood:125]  BLOOD ADMINISTERED:none  DRAINS: (10 [place]) TLS Drain(s) to suction in the Anterior cervical spine   LOCAL MEDICATIONS USED:  MARCAINE 0.5% 1:1 EXPAREL 1.3% Amount: 10 ml  SPECIMEN:  No Specimen  DISPOSITION OF SPECIMEN:  N/A  COUNTS:  YES  TOURNIQUET:  * No tourniquets in log *  DICTATION: .Dragon Dictation  PLAN OF CARE: Admit for overnight observation  PATIENT DISPOSITION:  PACU - hemodynamically stable.   Delay start of Pharmacological VTE agent (>24hrs) due to surgical blood loss or risk of bleeding: yes

## 2017-01-20 NOTE — Progress Notes (Signed)
Pt's only c/o is that he "has to pee". Foley is patent of 400cc clear yellow uo.  Frequent re-explanation provided to pt.

## 2017-01-20 NOTE — Anesthesia Procedure Notes (Signed)
Procedure Name: Intubation Date/Time: 01/20/2017 11:06 AM Performed by: Rejeana Brock L Pre-anesthesia Checklist: Patient identified, Emergency Drugs available, Suction available and Patient being monitored Patient Re-evaluated:Patient Re-evaluated prior to induction Oxygen Delivery Method: Circle System Utilized Preoxygenation: Pre-oxygenation with 100% oxygen Induction Type: IV induction Ventilation: Mask ventilation without difficulty Laryngoscope Size: Glidescope and 4 Grade View: Grade I Tube type: Oral Number of attempts: 1 Airway Equipment and Method: Stylet and Oral airway Placement Confirmation: ETT inserted through vocal cords under direct vision,  positive ETCO2 and breath sounds checked- equal and bilateral Secured at: 22 cm Tube secured with: Tape Dental Injury: Teeth and Oropharynx as per pre-operative assessment  Difficulty Due To: Difficulty was anticipated, Difficult Airway- due to anterior larynx and Difficult Airway- due to reduced neck mobility

## 2017-01-20 NOTE — Interval H&P Note (Signed)
History and Physical Interval Note:  01/20/2017 10:33 AM  Joel Galloway  has presented today for surgery, with the diagnosis of cervical spinal stenosis C5-6 and C6-7  The various methods of treatment have been discussed with the patient and family. After consideration of risks, benefits and other options for treatment, the patient has consented to  Procedure(s): C6 CORPECTOMY WITH BILATERAL FORAMINOTOMIES C5-6 AND C6-7, FUSION C5-7 ANTERIORLY WITH STRUT ALLOGRAFT AND LOCAL BONE GRAFT, ANTERIOR CERVICAL PLATE AND SCREWS (N/A) as a surgical intervention .  The patient's history has been reviewed, patient examined, no change in status, stable for surgery.  I have reviewed the patient's chart and labs.  Questions were answered to the patient's satisfaction.     [name]

## 2017-01-21 ENCOUNTER — Encounter (HOSPITAL_COMMUNITY): Payer: Self-pay | Admitting: General Practice

## 2017-01-21 ENCOUNTER — Telehealth (INDEPENDENT_AMBULATORY_CARE_PROVIDER_SITE_OTHER): Payer: Self-pay | Admitting: Radiology

## 2017-01-21 DIAGNOSIS — M4802 Spinal stenosis, cervical region: Secondary | ICD-10-CM | POA: Diagnosis not present

## 2017-01-21 LAB — BASIC METABOLIC PANEL
Anion gap: 7 (ref 5–15)
BUN: 9 mg/dL (ref 6–20)
CO2: 26 mmol/L (ref 22–32)
CREATININE: 0.82 mg/dL (ref 0.61–1.24)
Calcium: 8.5 mg/dL — ABNORMAL LOW (ref 8.9–10.3)
Chloride: 105 mmol/L (ref 101–111)
GFR calc Af Amer: >60 mL/min (ref >60)
Glucose, Bld: 108 mg/dL — ABNORMAL HIGH (ref 65–99)
POTASSIUM: 3.6 mmol/L (ref 3.5–5.1)
SODIUM: 138 mmol/L (ref 135–145)

## 2017-01-21 LAB — CBC
HCT: 35 % — ABNORMAL LOW (ref 39.0–52.0)
Hemoglobin: 11.6 g/dL — ABNORMAL LOW (ref 13.0–17.0)
MCH: 31.1 pg (ref 26.0–34.0)
MCHC: 33.1 g/dL (ref 30.0–36.0)
MCV: 93.8 fL (ref 78.0–100.0)
PLATELETS: 278 10*3/uL (ref 150–400)
RBC: 3.73 MIL/uL — ABNORMAL LOW (ref 4.22–5.81)
RDW: 13.5 % (ref 11.5–15.5)
WBC: 13.7 10*3/uL — ABNORMAL HIGH (ref 4.0–10.5)

## 2017-01-21 MED ORDER — DOCUSATE SODIUM 100 MG PO CAPS: 100.0000 mg | ORAL_CAPSULE | Freq: Two times a day (BID) | ORAL | 0 refills | Status: DC

## 2017-01-21 MED ORDER — OXYCODONE-ACETAMINOPHEN 10-325 MG PO TABS: 1.0000 | ORAL_TABLET | ORAL | 0 refills | Status: DC | PRN

## 2017-01-21 MED ORDER — OXYCODONE HCL ER 10 MG PO T12A: 10.0000 mg | EXTENDED_RELEASE_TABLET | Freq: Two times a day (BID) | ORAL | 0 refills | Status: DC

## 2017-01-21 MED ORDER — METHOCARBAMOL 500 MG PO TABS: 500.0000 mg | ORAL_TABLET | Freq: Three times a day (TID) | ORAL | 1 refills | Status: DC | PRN

## 2017-01-21 MED FILL — Thrombin For Soln 20000 Unit: CUTANEOUS | Qty: 1 | Status: AC

## 2017-01-21 NOTE — Care Management Note (Signed)
Case Management Note  Patient Details  Name: Joel Galloway MRN: [id number] Date of Birth: 12-Sep-1960  Subjective/Objective:    Pt is s/p   C6 CORPECTOMY WITH BILATERAL FORAMINOTOMIES C5-6 AND C6-7, FUSION C5-7 ANTERIORLY WITH STRUT ALLOGRAFT AND LOCAL BONE GRAFT, ANTERIOR CERVICAL PLATE AND SCREWS. He is from home with his spouse.                Action/Plan: No f/u per PT/OT and no DME needs. Pt has f/u, insurance and transportation home. No further needs per CM.   Expected Discharge Date:  01/21/17               Expected Discharge Plan:  Home/Self Care  In-House Referral:     Discharge planning Services     Post Acute Care Choice:    Choice offered to:     DME Arranged:    DME Agency:     HH Arranged:    HH Agency:     Status of Service:  Completed, signed off  If discussed at [employer] of Stay Meetings, dates discussed:    Additional Comments:  Pollie Friar, RN 01/21/2017, 11:57 AM

## 2017-01-21 NOTE — Telephone Encounter (Signed)
Patient is calling states that he has a cold.  Says that he was just discharged from hospital and he has a runny nose and a cough.  Please advise.

## 2017-01-21 NOTE — Discharge Summary (Signed)
Physician Discharge Summary      Patient ID: JD MCCASTER MRN: [id number] DOB/AGE: [birth year] 55 y.o.  Admit date: 01/20/2017 Discharge date: 01/21/2017  Admission Diagnoses:  Active Problems:   S/P cervical spinal fusion   Discharge Diagnoses:  Same  Past Medical History:  Diagnosis Date  . Anxiety   . Arthritis   . Asthma   . Cancer ([place])    skin   . Chronic kidney disease    AA      . Claustrophobia   . Complication of anesthesia    pt states he is unable to tol. foleys,due to anatomy  . Coronary artery disease   . Headache    pt reports migraines  . Hypoglycemia    las t episode 12/24/12  . Multiple sclerosis ([place])   . 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 Seasonal allergies   . Seizures ([place])   . Sleep apnea    Pt reports "mild sleep apnea"  no cpap  . Vision abnormalities     Surgeries: Procedure(s): C6 CORPECTOMY WITH BILATERAL FORAMINOTOMIES C5-6 AND C6-7, FUSION C5-7 ANTERIORLY WITH STRUT ALLOGRAFT AND LOCAL BONE GRAFT, ANTERIOR CERVICAL PLATE AND SCREWS on 06/25/621   Consultants:   Discharged Condition: Improved  Hospital Course: VALERIO PINARD is an 56 y.o. male who was admitted 01/20/2017 with a chief complaint of No chief complaint on file. , and found to have a diagnosis of <principal problem not specified>. He was brought to the operating room on 01/20/2017 and underwent the above named procedures.    He was given perioperative antibiotics:  Anti-infectives    Start     Dose/Rate Route Frequency Ordered Stop   01/20/17 1900  ceFAZolin (ANCEF) IVPB 2g/100 mL premix     2 g 200 mL/hr over 30 Minutes Intravenous Every 8 hours 01/20/17 1813 01/21/17 0354   01/20/17 0826  ceFAZolin (ANCEF) 2-4 GM/100ML-% IVPB    Comments:  Merryl Hacker   : cabinet override      01/20/17 0826 01/20/17 2029    He recovered in the PACU and was transferred to Med-Surgery floor and continued his post operative care at [facility] 5 M Bed 11. His incision dressing showed  minimal drainage. Drain TLS red top tube was not changed till the next AM but then only drained 7 cc. His VS remained stable. POD#1 VSS, he was voiding without difficult and his pain controlled with po narcotic medications. His drain TLS anterior neck was discontinued and dressing changed, Scant drainage and no significant drainage from the area of drain tube removal. His arm pain was relieved post op and his motor exam was normal. He was standing and walking without difficulty. An Aspen collar was placed and the philadelphia collar to be used when bathing. All questions ware answered. He was discharged home on POD#1.   He was given sequential compression devices and early ambulation for DVT prophylaxis.  He benefited maximally from their hospital stay and there were no complications.    Recent vital signs:  Vitals:   01/21/17 0415 01/21/17 0948  BP: 113/71 115/69  Pulse: 73 85  Resp: 18 18  Temp: 98.3 F (36.8 C) 98 F (36.7 C)  SpO2: 100% 100%    Recent laboratory studies:  Results for orders placed or performed during the hospital encounter of 01/20/17  Comprehensive metabolic panel  Result Value Ref Range   Sodium 138 135 - 145 mmol/L   Potassium 4.5 3.5 -  5.1 mmol/L   Chloride 109 101 - 111 mmol/L   CO2 20 (L) 22 - 32 mmol/L   Glucose, Bld 94 65 - 99 mg/dL   BUN 24 (H) 6 - 20 mg/dL   Creatinine, Ser 1.12 0.61 - 1.24 mg/dL   Calcium 8.5 (L) 8.9 - 10.3 mg/dL   Total Protein 5.8 (L) 6.5 - 8.1 g/dL   Albumin 3.7 3.5 - 5.0 g/dL   AST 18 15 - 41 U/L   ALT 23 17 - 63 U/L   Alkaline Phosphatase 31 (L) 38 - 126 U/L   Total Bilirubin 0.5 0.3 - 1.2 mg/dL   GFR calc non Af Amer >60 >60 mL/min   GFR calc Af Amer >60 >60 mL/min   Anion gap 9 5 - 15  Protime-INR  Result Value Ref Range   Prothrombin Time 12.2 11.4 - 15.2 seconds   INR 0.91   APTT  Result Value Ref Range   aPTT 24 24 - 36 seconds  Urinalysis, Routine w reflex microscopic  Result Value Ref Range   Color, Urine  YELLOW YELLOW   APPearance CLEAR CLEAR   Specific Gravity, Urine 1.030 1.005 - 1.030   pH 5.0 5.0 - 8.0   Glucose, UA NEGATIVE NEGATIVE mg/dL   Hgb urine dipstick NEGATIVE NEGATIVE   Bilirubin Urine SMALL (A) NEGATIVE   Ketones, ur NEGATIVE NEGATIVE mg/dL   Protein, ur 30 (A) NEGATIVE mg/dL   Nitrite NEGATIVE NEGATIVE   Leukocytes, UA NEGATIVE NEGATIVE   RBC / HPF 0-5 0 - 5 RBC/hpf   WBC, UA 0-5 0 - 5 WBC/hpf   Bacteria, UA NONE SEEN NONE SEEN   Squamous Epithelial / LPF 0-5 (A) NONE SEEN   Hyaline Casts, UA PRESENT   CBC  Result Value Ref Range   WBC 13.7 (H) 4.0 - 10.5 K/uL   RBC 3.73 (L) 4.22 - 5.81 MIL/uL   Hemoglobin 11.6 (L) 13.0 - 17.0 g/dL   HCT 35.0 (L) 39.0 - 52.0 %   MCV 93.8 78.0 - 100.0 fL   MCH 31.1 26.0 - 34.0 pg   MCHC 33.1 30.0 - 36.0 g/dL   RDW 13.5 11.5 - 15.5 %   Platelets 278 150 - 400 K/uL  Basic Metabolic Panel  Result Value Ref Range   Sodium 138 135 - 145 mmol/L   Potassium 3.6 3.5 - 5.1 mmol/L   Chloride 105 101 - 111 mmol/L   CO2 26 22 - 32 mmol/L   Glucose, Bld 108 (H) 65 - 99 mg/dL   BUN 9 6 - 20 mg/dL   Creatinine, Ser 0.82 0.61 - 1.24 mg/dL   Calcium 8.5 (L) 8.9 - 10.3 mg/dL   GFR calc non Af Amer >60 >60 mL/min   GFR calc Af Amer >60 >60 mL/min   Anion gap 7 5 - 15    Discharge Medications:   Allergies as of 01/21/2017      Reactions   Antihistamines, Chlorpheniramine-type Other (See Comments)   Chest tightness   Lyrica [pregabalin] Palpitations   Other Anaphylaxis   nuts   Neosporin [neomycin-bacitracin Zn-polymyx] Swelling   Aspirin Other (See Comments)   High doses causes toxicity    Benadryl [diphenhydramine] Other (See Comments)   Chest tightness   Benztropine Other (See Comments)   Unknown   Tape    rash   Gabapentin Palpitations   Propranolol Nausea Only   Chest tightness      Medication List    STOP taking these  medications   HYDROcodone-acetaminophen 10-325 MG tablet Commonly known as:  NORCO     TAKE these  medications   acetaminophen 500 MG tablet Commonly known as:  TYLENOL Take 1,000 mg by mouth as needed for moderate pain.   albuterol 108 (90 Base) MCG/ACT inhaler Commonly known as:  PROVENTIL HFA;VENTOLIN HFA Inhale 2 puffs into the lungs every 6 (six) hours as needed for wheezing or shortness of breath.   aspirin EC 325 MG tablet Take 325 mg by mouth daily as needed for moderate pain.   B-12 PO Take 1 tablet by mouth daily.   B-6 PO Take 1 tablet by mouth daily.   baclofen 10 MG tablet Commonly known as:  LIORESAL Take 10 mg by mouth 4 (four) times daily as needed for muscle spasms.   BELSOMRA 5 MG Tabs Generic drug:  Suvorexant Take 5-10 mg by mouth at bedtime as needed (sleep).   clonazePAM 1 MG tablet Commonly known as:  KLONOPIN Take 1 mg by mouth 2 (two) times daily.   diazepam 5 MG tablet Commonly known as:  VALIUM Take one tablet po at the MRI and may repeat x1 for anxiety and feelings of claustrophobia.   docusate sodium 100 MG capsule Commonly known as:  COLACE Take 1 capsule (100 mg total) by mouth 2 (two) times daily.   ibuprofen 200 MG tablet Commonly known as:  ADVIL,MOTRIN Take 800 mg by mouth as needed for moderate pain.   methocarbamol 500 MG tablet Commonly known as:  ROBAXIN Take 1 tablet (500 mg total) by mouth every 8 (eight) hours as needed for muscle spasms.   metoprolol 200 MG 24 hr tablet Commonly known as:  TOPROL-XL Take 100 mg by mouth 2 (two) times daily.   metoprolol tartrate 100 MG tablet Commonly known as:  LOPRESSOR Take 50 mg by mouth 2 (two) times daily as needed (palpitations).   multivitamin with minerals Tabs tablet Take 1 tablet by mouth daily.   oxyCODONE 10 mg 12 hr tablet Commonly known as:  OXYCONTIN Take 1 tablet (10 mg total) by mouth every 12 (twelve) hours.   oxyCODONE-acetaminophen 10-325 MG tablet Commonly known as:  PERCOCET Take 1-2 tablets by mouth every 4 (four) hours as needed for pain. For break  through pain. MAXIMUM TOTAL ACETAMINOPHEN DOSE IS 4000 MG PER DAY What changed:  when to take this  additional instructions   testosterone cypionate 200 MG/ML injection Commonly known as:  DEPOTESTOSTERONE CYPIONATE Inject 200 mg into the muscle every 7 (seven) days.   VITAMIN C PO Take 1 tablet by mouth daily.   VITAMIN D PO Take 1 tablet by mouth daily.            Discharge Care Instructions        Start     Ordered   01/21/17 0000  docusate sodium (COLACE) 100 MG capsule  2 times daily     01/21/17 0858   01/21/17 0000  oxyCODONE (OXYCONTIN) 10 mg 12 hr tablet  Every 12 hours     01/21/17 0858   01/21/17 0000  oxyCODONE-acetaminophen (PERCOCET) 10-325 MG tablet  Every 4 hours PRN     01/21/17 0858   01/21/17 0000  methocarbamol (ROBAXIN) 500 MG tablet  Every 8 hours PRN     01/21/17 0858   01/21/17 0000  Call MD / Call 911    Comments:  If you experience chest pain or shortness of breath, CALL 911 and be transported to the hospital emergency  room.  If you develope a fever above 101 F, pus (white drainage) or increased drainage or redness at the wound, or calf pain, call your surgeon's office.   01/21/17 0858   01/21/17 0000  Constipation Prevention    Comments:  Drink plenty of fluids.  Prune juice may be helpful.  You may use a stool softener, such as Colace (over the counter) 100 mg twice a day.  Use MiraLax (over the counter) for constipation as needed.   01/21/17 0858   01/21/17 0000  Increase activity slowly as tolerated     01/21/17 0858   01/21/17 0000  Discharge instructions    Comments:  No lifting greater than 10 lbs. No overhead use of arms. Avoid bending,and twisting neck. Walk in house for first week them may start to get out slowly increasing distance up to one mile by 3 weeks post op. Keep incision dry for 3 days, may then bathe and wet incision using a Philadelphia collar when showering. Call if any fevers >101, chills, or increasing numbness or  weakness or increased swelling or drainage.   01/21/17 0858   01/21/17 0000  Diet general    Comments:  Start with liquids and advance to thicker liquids then soft solids then soft diet then regular, chew well to prevent dysphagia.   01/21/17 0858   01/21/17 0000  Driving restrictions    Comments:  No driving for 2 weeks, no driving while taking narcotics or muscle relaxers.   01/21/17 0858   01/21/17 0000  Lifting restrictions    Comments:  No lifting for 8 weeks   01/21/17 0858      Diagnostic Studies: Dg Cervical Spine 2-3 Views  Result Date: 01/20/2017 CLINICAL DATA:  Elective surgery EXAM: CERVICAL SPINE - 2-3 VIEW COMPARISON:  Portable exam 1540 hours compared to 1152 hours FINDINGS: Anterior surgical drain present in the cervical region. Anterior plate and screws present at C5-C7 post anterior fusion. No definite fracture or subluxation. IMPRESSION: Anterior fusion C5-C7. Electronically Signed   By: Lavonia Dana M.D.   On: 01/20/2017 16:01   Dg Cervical Spine 2-3 Views  Result Date: 01/20/2017 CLINICAL DATA:  Surgical anterior fusion. EXAM: CERVICAL SPINE - 2-3 VIEW COMPARISON:  MRI of December 27, 2016. FINDINGS: Four intraoperative cross-table lateral projections were obtained of the lumbar spine. The first image demonstrates surgical probes directed toward the anterior margins of the C6-7 and C7-T1 disc spaces. The second image demonstrates surgical probes directed toward the anterior margins of the C5-6 and C6-7 disc spaces. The third and fourth images demonstrate the patient be status post surgical anterior fusion extending from C5 to C7. IMPRESSION: Status post surgical fusion of C5 through C7. Electronically Signed   By: Marijo Conception, M.D.   On: 01/20/2017 14:24   Mr Cervical Spine W/o Contrast  Result Date: 12/27/2016 CLINICAL DATA:  Chronic neck pain. Pain extends into the left shoulder and upper extremity. Numbness into the left hand. Below EXAM: MRI CERVICAL SPINE WITHOUT  CONTRAST TECHNIQUE: Multiplanar, multisequence MR imaging of the cervical spine was performed. No intravenous contrast was administered. COMPARISON:  MRI of the lumbar spine 06/28/2016 FINDINGS: Alignment: AP alignment is anatomic. There straightening of the normal cervical lordosis. Vertebrae: Chronic endplate marrow changes have progressed at C5-6 and C6-7. Cord: Normal signal is present in the cervical and upper thoracic spinal cord to the lowest imaged level, T1. Posterior Fossa, vertebral arteries, paraspinal tissues: The craniocervical junction is normal. The visualized intracranial contents  are normal. Flow is present in the vertebral artery is bilaterally. Disc levels: C2-3:  Negative. C3-4: Asymmetric left-sided uncovertebral and facet hypertrophy results in a similar appearance of moderate left foraminal stenosis. There is partial effacement of the ventral CSF. C4-5: Asymmetric left-sided uncovertebral spurring results in mild left foraminal narrowing. C5-6: A broad-based disc osteophyte complex is present. Uncovertebral and facet hypertrophy is noted bilaterally. There is partial effacement of the ventral CSF. Moderate foraminal stenosis is worse on the right without significant interval change. C6-7: A leftward disc osteophyte complex is present. Asymmetric uncovertebral spurring leads to severe left and moderate right foraminal stenosis. There is some progression on the left. C7-T1: Bilateral facet hypertrophy is again noted. Mild right foraminal narrowing is evident. IMPRESSION: 1. Slight progression of severe left foraminal stenosis at C6-7. 2. Moderate right foraminal narrowing at C6-7 is unchanged. 3. Mild central canal narrowing and moderate foraminal narrowing bilaterally at C5-6 is worse on the right without significant change. 4. Stable appearance of moderate left foraminal stenosis at C3-4. 5. Progressive endplate marrow changes at see C5-6 and C6-7. Electronically Signed   By: San Morelle M.D.   On: 12/27/2016 12:48    Disposition: 01-Home or Self Care  Discharge Instructions    Call MD / Call 911    Complete by:  As directed    If you experience chest pain or shortness of breath, CALL 911 and be transported to the hospital emergency room.  If you develope a fever above 101 F, pus (white drainage) or increased drainage or redness at the wound, or calf pain, call your surgeon's office.   Constipation Prevention    Complete by:  As directed    Drink plenty of fluids.  Prune juice may be helpful.  You may use a stool softener, such as Colace (over the counter) 100 mg twice a day.  Use MiraLax (over the counter) for constipation as needed.   Diet general    Complete by:  As directed    Start with liquids and advance to thicker liquids then soft solids then soft diet then regular, chew well to prevent dysphagia.   Discharge instructions    Complete by:  As directed    No lifting greater than 10 lbs. No overhead use of arms. Avoid bending,and twisting neck. Walk in house for first week them may start to get out slowly increasing distance up to one mile by 3 weeks post op. Keep incision dry for 3 days, may then bathe and wet incision using a Philadelphia collar when showering. Call if any fevers >101, chills, or increasing numbness or weakness or increased swelling or drainage.   Driving restrictions    Complete by:  As directed    No driving for 2 weeks, no driving while taking narcotics or muscle relaxers.   Increase activity slowly as tolerated    Complete by:  As directed    Lifting restrictions    Complete by:  As directed    No lifting for 8 weeks      Follow-up Information    Jessy Oto, MD In 2 weeks.   Specialty:  Orthopedic Surgery Why:  For wound re-check Contact information: [place] [address] [phone]            Signed: [name] 01/21/2017, 5:23 PM

## 2017-01-21 NOTE — Evaluation (Signed)
Occupational Therapy Evaluation Patient Details Name: Joel Galloway MRN: [id number] DOB: 06-17-1960 Today's Date: 01/21/2017    History of Present Illness 56 y.o. male presenting with cerival spinal stenosis C5-6 and C6-7. Underwent C5-7 fusion on 01/20/17. PHM including anxiety, arthritis, cancer, vision abnormalities, seizures, CAD, and chronic kidney disease.    Clinical Impression   PTA, pt was living with his wife and was independent. Currently, pt requires Min A for UB ADLs and supervision for other ADLS and functional mobility. Pt requiring Min-Mod tactile and verbal cues to adhere to cervical precautions during ADLs. Provided education on cervical precautions, UB ADLs, managing/care of cervical collar, and tub transfer with shower seat; pt demonstrated understanding. Answered all pt questions. Recommend dc home once medically stable per physician. All acute OT needs met and will sign off. Thank you.     Follow Up Recommendations  No OT follow up;Supervision/Assistance - 24 hour    Equipment Recommendations  None recommended by OT    Recommendations for Other Services       Precautions / Restrictions Precautions Precautions: Cervical Precaution Comments: Provided hand out and reviewed information. Pt requiring Min-Mod A cues to turn whole body and keeping arms below shoulders during ADLs.Marland Kitchen  Required Braces or Orthoses: Cervical Brace Cervical Brace: Soft collar;At all times;Other (comment) (Off for showers and in bed) Restrictions Weight Bearing Restrictions: No      Mobility Bed Mobility Overal bed mobility: Modified Independent             General bed mobility comments: Pt demosntrating understanding of log roll technique and performed with increased time and VCs  Transfers Overall transfer level: Needs assistance Equipment used: None Transfers: Sit to/from Stand Sit to Stand: Supervision         General transfer comment: Supervision for safety     Balance Overall balance assessment: No apparent balance deficits (not formally assessed)                                         ADL either performed or assessed with clinical judgement   ADL Overall ADL's : Needs assistance/impaired Eating/Feeding: Set up;Sitting   Grooming: Set up;Supervision/safety;Standing   Upper Body Bathing: Cueing for sequencing;Cueing for safety;Standing;Minimal assistance   Lower Body Bathing: Set up;Supervison/ safety;Sit to/from stand;Cueing for sequencing;Cueing for safety   Upper Body Dressing : Sitting;Cueing for sequencing;Cueing for safety;Minimal assistance Upper Body Dressing Details (indicate cue type and reason): Pt already dressed. Reviewed UB dressing while adhereing to cervical precautions. Pt stating "oh I know" and then requiring verbal and tactile cues to keep BUE shoulder flexion below 90 degrees. Pt requiring Min A to don/dof cervical collar.  Lower Body Dressing: Supervision/safety;Set up;Sit to/from stand;Cueing for safety;Cueing for sequencing Lower Body Dressing Details (indicate cue type and reason): Pt able to bring ankle to knee Toilet Transfer: Set up;Supervision/safety;Ambulation       Tub/ Shower Transfer: Set up;Supervision/safety;Ambulation;Shower seat;Cueing for sequencing;Cueing for safety   Functional mobility during ADLs: Supervision/safety General ADL Comments: Pt performing UB ADLs with Min A to adhere to cervical precautions. Performed other ADLs and functional mobiltiy at a supervision level. Required Min -Mod VCs throughout to adhere to cerival precautions.     Vision         Perception     Praxis      Pertinent Vitals/Pain Pain Assessment: 0-10 Pain Score: 2  Pain Location: Throat  Pain Descriptors / Indicators: Discomfort Pain Intervention(s): Monitored during session;Repositioned     Hand Dominance Right   Extremity/Trunk Assessment Upper Extremity Assessment Upper Extremity  Assessment: Overall WFL for tasks assessed   Lower Extremity Assessment Lower Extremity Assessment: Overall WFL for tasks assessed   Cervical / Trunk Assessment Cervical / Trunk Assessment: Other exceptions Cervical / Trunk Exceptions: s/p C5-7 fusion   Communication Communication Communication: No difficulties   Cognition Arousal/Alertness: Awake/alert Behavior During Therapy: WFL for tasks assessed/performed;Restless; Impulsive Overall Cognitive Status: Within Functional Limits for tasks assessed                                     General Comments       Exercises     Shoulder Instructions      Home Living Family/patient expects to be discharged to:: Private residence Living Arrangements: Spouse/significant other Available Help at Discharge: Family;Available 24 hours/day (wife first week then sister in law) Type of Home: House Home Access: Ramped entrance     [place]: Two level;Able to live on main level with bedroom/bathroom Alternate Level Stairs-Number of Steps: 13   Bathroom Shower/Tub: Walk-in shower;Tub only   Bathroom Toilet: Standard     Home Equipment: Tub bench;Cane - single point          Prior Functioning/Environment Level of Independence: Independent        Comments: ADLs, IADLs, driving, owns a company        OT Problem List: Decreased range of motion;Decreased safety awareness;Decreased knowledge of use of DME or AE;Decreased knowledge of precautions;Pain      OT Treatment/Interventions:      OT Goals(Current goals can be found in the care plan section) Acute Rehab OT Goals Patient Stated Goal: Go home OT Goal Formulation: With patient Time For Goal Achievement: 02/04/17 Potential to Achieve Goals: Good  OT Frequency:     Barriers to D/C:            Co-evaluation              AM-PAC PT "6 Clicks" Daily Activity     Outcome Measure Help from another person eating meals?: None Help from another person  taking care of personal grooming?: None Help from another person toileting, which includes using toliet, bedpan, or urinal?: A Little Help from another person bathing (including washing, rinsing, drying)?: A Little Help from another person to put on and taking off regular upper body clothing?: A Little Help from another person to put on and taking off regular lower body clothing?: None 6 Click Score: 21   End of Session Equipment Utilized During Treatment: Cervical collar Nurse Communication: Mobility status;Precautions  Activity Tolerance: Patient tolerated treatment well Patient left: in bed;with call bell/phone within reach  OT Visit Diagnosis: Pain;Other (comment) (Cerival precautions) Pain - Right/Left:  (Neck) Pain - part of body:  (Neck)                Time: 1610-9604 OT Time Calculation (min): 19 min Charges:  OT General Charges $OT Visit: 1 Visit OT Evaluation $OT Eval Moderate Complexity: 1 Mod G-Codes: OT G-codes **NOT FOR INPATIENT CLASS** Functional Assessment Tool Used: Clinical judgement Functional Limitation: Self care Self Care Current Status (V4098): At least 1 percent but less than 20 percent impaired, limited or restricted Self Care Goal Status (J1914): 0 percent impaired, limited or restricted Self Care Discharge Status [phone]): At least  1 percent but less than 20 percent impaired, limited or restricted   [place], OTR/L Acute Rehab Pager: [phone] Office: [place] 01/21/2017, 8:22 AM

## 2017-01-21 NOTE — Progress Notes (Signed)
Patient arrived on floor around 1800 he has done very well removed foley, he has been ambulating independently since 0200 he says that he feels good and would like to go home in the AM. I can not see any reason for him to stay other than his drain is still putting out as the tube was changed once.

## 2017-01-21 NOTE — Progress Notes (Signed)
Pt discharged per orders from MD. Pt educated on discharge instructions. Pt verbalized understanding of instructions. All questions and concerns were addressed. Pt's IV was removed prior to discharge. Pt exited hospital via wheelchair.

## 2017-01-21 NOTE — Progress Notes (Signed)
     Subjective: 1 Day Post-Op Procedure(s) (LRB): C6 CORPECTOMY WITH BILATERAL FORAMINOTOMIES C5-6 AND C6-7, FUSION C5-7 ANTERIORLY WITH STRUT ALLOGRAFT AND LOCAL BONE GRAFT, ANTERIOR CERVICAL PLATE AND SCREWS (N/A) Can I go home today.,  My arms feel great. Not hurting. Voiding without difficulty.  Patient reports pain as moderate.    Objective:   VITALS:  Temp:  [97 F (36.1 C)-98.3 F (36.8 C)] 98.3 F (36.8 C) (09/05 0415) Pulse Rate:  [73-77] 73 (09/05 0415) Resp:  [14-21] 18 (09/05 0415) BP: (109-137)/(58-83) 113/71 (09/05 0415) SpO2:  [99 %-100 %] 100 % (09/05 0415)  Neurologically intact ABD soft Neurovascular intact Sensation intact distally Intact pulses distally Dorsiflexion/Plantar flexion intact Incision: scant drainage and TLS drain with about 7 cc bloody drainage, reportedly changed only this AM 1x since surgery. No cellulitis present Drain removed, no significant residual drainage from the drain site.   LABS  Recent Labs  01/21/17 0510  HGB 11.6*  WBC 13.7*  PLT 278    Recent Labs  01/20/17 0827 01/21/17 0510  NA 138 138  K 4.5 3.6  CL 109 105  CO2 20* 26  BUN 24* 9  CREATININE 1.12 0.82  GLUCOSE 94 108*    Recent Labs  01/20/17 0827  INR 0.91     Assessment/Plan: 1 Day Post-Op Procedure(s) (LRB): C6 CORPECTOMY WITH BILATERAL FORAMINOTOMIES C5-6 AND C6-7, FUSION C5-7 ANTERIORLY WITH STRUT ALLOGRAFT AND LOCAL BONE GRAFT, ANTERIOR CERVICAL PLATE AND SCREWS (N/A)  Advance diet Up with therapy D/C IV fluids Discharge home with home health  Joel Galloway 01/21/2017, 8:48 AM Patient ID: Joel Galloway, male   DOB: Feb 06, 1961, 56 y.o.   MRN: [id number]

## 2017-01-21 NOTE — Evaluation (Signed)
Physical Therapy Evaluation Patient Details Name: Joel Galloway MRN: [id number] DOB: 06/06/1960 Today's Date: 01/21/2017   History of Present Illness  56 y.o. male presenting with cerival spinal stenosis C5-6 and C6-7. Underwent C5-7 fusion on 01/20/17. PHM including anxiety, arthritis, cancer, vision abnormalities, seizures, CAD, and chronic kidney disease.   Clinical Impression  Pt is at or close to baseline functioning and should be safe at home. There are no further acute PT needs.  Will sign off at this time.     Follow Up Recommendations No PT follow up    Equipment Recommendations  None recommended by PT    Recommendations for Other Services       Precautions / Restrictions Precautions Precautions: Cervical Precaution Comments: Provided hand out and reviewed information Required Braces or Orthoses: Cervical Brace Cervical Brace: Soft collar;At all times;Other (comment) (Off for showers and in bed) Restrictions Weight Bearing Restrictions: No      Mobility  Bed Mobility Overal bed mobility: Modified Independent             General bed mobility comments: Pt demosntrating understanding of log roll technique and performed with increased time and VCs  Transfers Overall transfer level: Modified independent Equipment used: None Transfers: Sit to/from Stand Sit to Stand: Modified independent (Device/Increase time)         General transfer comment: Supervision for safety  Ambulation/Gait Ambulation/Gait assistance: Independent (in home-like environment) Ambulation Distance (Feet): 500 Feet Assistive device: None Gait Pattern/deviations: WFL(Within Functional Limits);Step-through pattern Gait velocity: community ambulator Gait velocity interpretation: at or above normal speed for age/gender General Gait Details: steady with fluid gait, age appropriate speeds even with moderate challenges including abrupt directional changes, backing up, stepping over obstacle,  scanning etc.  Stairs Stairs: Yes Stairs assistance: Modified independent (Device/Increase time) Stair Management: One rail Right;Alternating pattern;Forwards Number of Stairs: 10 General stair comments: safe and fluid movement with rail  Wheelchair Mobility    Modified Rankin (Stroke Patients Only)       Balance Overall balance assessment: No apparent balance deficits (not formally assessed)                                           Pertinent Vitals/Pain Pain Assessment: Faces Faces Pain Scale: Hurts a little bit Pain Location: Throat Pain Descriptors / Indicators: Discomfort Pain Intervention(s): Monitored during session    Home Living Family/patient expects to be discharged to:: Private residence Living Arrangements: Spouse/significant other Available Help at Discharge: Family;Available 24 hours/day (wife first week then sister in law) Type of Home: House Home Access: Ramped entrance     [place]: Two level;Able to live on main level with bedroom/bathroom Home Equipment: Kasandra Knudsen - single point;Shower seat      Prior Function Level of Independence: Independent         Comments: ADLs, IADLs, driving, owns a company     Hand Dominance   Dominant Hand: Right    Extremity/Trunk Assessment   Upper Extremity Assessment Upper Extremity Assessment: Overall WFL for tasks assessed    Lower Extremity Assessment Lower Extremity Assessment: Overall WFL for tasks assessed    Cervical / Trunk Assessment Cervical / Trunk Assessment: Other exceptions Cervical / Trunk Exceptions: s/p C5-7 fusion  Communication   Communication: No difficulties  Cognition Arousal/Alertness: Awake/alert Behavior During Therapy: WFL for tasks assessed/performed;Restless Overall Cognitive Status: Within Functional Limits for tasks assessed  General Comments General comments (skin integrity, edema, etc.):  Reinforced cervical care/prec, lifting restrictions, typical progression of activity and bracing issues (cleaning procedures, wearing schedule in general)    Exercises     Assessment/Plan    PT Assessment Patent does not need any further PT services  PT Problem List         PT Treatment Interventions      PT Goals (Current goals can be found in the Care Plan section)  Acute Rehab PT Goals Patient Stated Goal: Go home PT Goal Formulation: All assessment and education complete, DC therapy    Frequency     Barriers to discharge        Co-evaluation               AM-PAC PT "6 Clicks" Daily Activity  Outcome Measure Difficulty turning over in bed (including adjusting bedclothes, sheets and blankets)?: None Difficulty moving from lying on back to sitting on the side of the bed? : None Difficulty sitting down on and standing up from a chair with arms (e.g., wheelchair, bedside commode, etc,.)?: None Help needed moving to and from a bed to chair (including a wheelchair)?: None Help needed walking in hospital room?: None Help needed climbing 3-5 steps with a railing? : None 6 Click Score: 24    End of Session Equipment Utilized During Treatment: Cervical collar Activity Tolerance: Patient tolerated treatment well Patient left: in bed;with call bell/phone within reach Nurse Communication: Mobility status PT Visit Diagnosis: Pain;Other abnormalities of gait and mobility (R26.89) Pain - part of body:  (neck)    Time: 1610-9604 PT Time Calculation (min) (ACUTE ONLY): 16 min   Charges:   PT Evaluation $PT Eval Low Complexity: 1 Low     PT G Codes:   PT G-Codes **NOT FOR INPATIENT CLASS** Functional Assessment Tool Used: AM-PAC 6 Clicks Basic Mobility;Clinical judgement Functional Limitation: Mobility: Walking and moving around Mobility: Walking and Moving Around Current Status (V4098): 0 percent impaired, limited or restricted Mobility: Walking and Moving Around Goal  Status (J1914): 0 percent impaired, limited or restricted Mobility: Walking and Moving Around Discharge Status [phone]): 0 percent impaired, limited or restricted    01/21/2017  Donnella Sham, PT [phone]  (pager)  Tessie Fass Kylen Ismael 01/21/2017, 11:04 AM

## 2017-01-22 ENCOUNTER — Ambulatory Visit (INDEPENDENT_AMBULATORY_CARE_PROVIDER_SITE_OTHER): Payer: 59 | Admitting: Specialist

## 2017-01-26 NOTE — Telephone Encounter (Signed)
Colds are generally due to viruses, he should drink plenty of water and take tylenol. If he develops fever or chills then he may need a CXR to rule out a pneumonia due to sedation and possible aspiration. I don't recommend antibiotics for  A cold. Over the counter robitussin for cough. Use of antihistamines can help decrease tendency to drainage and cough.

## 2017-01-27 NOTE — Telephone Encounter (Signed)
I called and lmom advising patient of Dr. Arvil Persons recommendation

## 2017-01-28 ENCOUNTER — Emergency Department (HOSPITAL_BASED_OUTPATIENT_CLINIC_OR_DEPARTMENT_OTHER): Payer: 59

## 2017-01-28 ENCOUNTER — Encounter (HOSPITAL_BASED_OUTPATIENT_CLINIC_OR_DEPARTMENT_OTHER): Payer: Self-pay

## 2017-01-28 ENCOUNTER — Telehealth: Payer: Self-pay | Admitting: *Deleted

## 2017-01-28 ENCOUNTER — Emergency Department (HOSPITAL_BASED_OUTPATIENT_CLINIC_OR_DEPARTMENT_OTHER)
Admission: EM | Admit: 2017-01-28 | Discharge: 2017-01-28 | Disposition: A | Discharge status: Home / Self Care | Payer: 59 | Attending: Emergency Medicine | Admitting: Emergency Medicine

## 2017-01-28 DIAGNOSIS — Z79899 Other long term (current) drug therapy: Secondary | ICD-10-CM | POA: Insufficient documentation

## 2017-01-28 DIAGNOSIS — J45909 Unspecified asthma, uncomplicated: Secondary | ICD-10-CM | POA: Diagnosis not present

## 2017-01-28 DIAGNOSIS — Z7982 Long term (current) use of aspirin: Secondary | ICD-10-CM | POA: Insufficient documentation

## 2017-01-28 DIAGNOSIS — J189 Pneumonia, unspecified organism: Secondary | ICD-10-CM | POA: Diagnosis not present

## 2017-01-28 DIAGNOSIS — Z85828 Personal history of other malignant neoplasm of skin: Secondary | ICD-10-CM | POA: Insufficient documentation

## 2017-01-28 DIAGNOSIS — N189 Chronic kidney disease, unspecified: Secondary | ICD-10-CM | POA: Insufficient documentation

## 2017-01-28 DIAGNOSIS — R0602 Shortness of breath: Secondary | ICD-10-CM | POA: Diagnosis present

## 2017-01-28 DIAGNOSIS — I2510 Atherosclerotic heart disease of native coronary artery without angina pectoris: Secondary | ICD-10-CM | POA: Diagnosis not present

## 2017-01-28 LAB — CBC WITH DIFFERENTIAL/PLATELET
Basophils Absolute: 0 10*3/uL (ref 0.0–0.1)
Basophils Relative: 0 %
EOS ABS: 0.3 10*3/uL (ref 0.0–0.7)
Eosinophils Relative: 4 %
HCT: 37.8 % — ABNORMAL LOW (ref 39.0–52.0)
Hemoglobin: 13.4 g/dL (ref 13.0–17.0)
LYMPHS PCT: 9 %
Lymphs Abs: 0.7 10*3/uL (ref 0.7–4.0)
MCH: 32.4 pg (ref 26.0–34.0)
MCHC: 35.4 g/dL (ref 30.0–36.0)
MCV: 91.5 fL (ref 78.0–100.0)
MONO ABS: 0.7 10*3/uL (ref 0.1–1.0)
Monocytes Relative: 9 %
NEUTROS PCT: 78 %
Neutro Abs: 6.6 10*3/uL (ref 1.7–7.7)
OTHER: 0 %
PLATELETS: 311 10*3/uL (ref 150–400)
RBC: 4.13 MIL/uL — ABNORMAL LOW (ref 4.22–5.81)
RDW: 12.2 % (ref 11.5–15.5)
WBC: 8.3 10*3/uL (ref 4.0–10.5)

## 2017-01-28 LAB — BASIC METABOLIC PANEL
ANION GAP: 8 (ref 5–15)
BUN: 11 mg/dL (ref 6–20)
CALCIUM: 8.6 mg/dL — AB (ref 8.9–10.3)
CO2: 22 mmol/L (ref 22–32)
Chloride: 105 mmol/L (ref 101–111)
Creatinine, Ser: 0.74 mg/dL (ref 0.61–1.24)
GLUCOSE: 125 mg/dL — AB (ref 65–99)
Potassium: 3.6 mmol/L (ref 3.5–5.1)
SODIUM: 135 mmol/L (ref 135–145)

## 2017-01-28 LAB — TROPONIN I

## 2017-01-28 LAB — D-DIMER, QUANTITATIVE (NOT AT ARMC): D DIMER QUANT: 0.77 ug{FEU}/mL — AB (ref 0.00–0.50)

## 2017-01-28 MED ORDER — AZITHROMYCIN 250 MG PO TABS: 250.0000 mg | ORAL_TABLET | Freq: Every day | ORAL | 0 refills | Status: DC

## 2017-01-28 MED ORDER — IOPAMIDOL (ISOVUE-370) INJECTION 76%
100.0000 mL | Freq: Once | INTRAVENOUS | Status: AC | PRN
  Administered 2017-01-28: 100 mL via INTRAVENOUS

## 2017-01-28 MED ORDER — SODIUM CHLORIDE 0.9 % IV BOLUS (SEPSIS)
1000.0000 mL | Freq: Once | INTRAVENOUS | Status: AC
Start: 2017-01-28 — End: 2017-01-28
  Administered 2017-01-28: 1000 mL via INTRAVENOUS

## 2017-01-28 NOTE — ED Provider Notes (Signed)
[facility]PT [facility] Provider Note   CSN: [id number] Arrival date & time: 01/28/17  1837     History   Chief Complaint Chief Complaint  Patient presents with  . Shortness of Breath    HPI Joel Galloway is a 56 y.o. male.  HPI  56 year old male with recent cervical spine fusion on 9/4 presents with shortness of breath. He states it started yesterday and lasted about one hour. He's had a cough since yesterday does seem to be worsening. It is not productive. He might have had a little bit of congestion. No fevers. However today's had intermittent shortness of breath more persistently. Feels that he can't quite catch his breath and there might be a little chest tightness. He has had no leg swelling. His neck was a little swollen after surgery but has significantly improved. Feels like his throat may be a little sore but not hoarse and no voice change.  Past Medical History:  Diagnosis Date  . Anxiety   . Arthritis   . Asthma   . Cancer ([place])    skin   . Chronic kidney disease    AA      . Claustrophobia   . Complication of anesthesia    pt states he is unable to tol. foleys,due to anatomy  . Coronary artery disease   . Headache    pt reports migraines  . Hypoglycemia    las t episode 12/24/12  . Multiple sclerosis ([place])   . 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 Seasonal allergies   . Seizures ([place])   . Sleep apnea    Pt reports "mild sleep apnea"  no cpap  . Vision abnormalities     Patient Active Problem List   Diagnosis Date Noted  . S/P cervical spinal fusion 01/20/2017  . Memory difficulties 10/14/2016  . Tachycardia 07/12/2016  . Chest pain 07/12/2016  . Polysubstance abuse 07/12/2016  . SIRS (systemic inflammatory response syndrome) ([place]) 07/12/2016  . Lactic acidosis 07/12/2016  . Anxiety state 07/12/2016  . Abnormal brain MRI 07/08/2016  . Chronic pain syndrome 07/08/2016  . Chronic migraine w/o aura w/o status migrainosus, not intractable  07/08/2016  . History of optic neuritis 06/02/2016  . Numbness 06/02/2016  . Gait disturbance 06/02/2016  . Urinary frequency 06/02/2016  . Other fatigue 06/02/2016  . Leg cramp 06/02/2016  . Pain in right hand 05/26/2016  . Chronic bilateral low back pain 05/26/2016  . Closed fracture of body of sternum 05/26/2016  . Nail bed injury right middle finger 07/13/2014  . Lumbar degenerative disc disease 01/12/2013    Class: Chronic  . HNP (herniated nucleus pulposus), lumbar 01/12/2013    Class: Chronic  . Spinal stenosis, lumbar region, with neurogenic claudication 01/12/2013    Class: Chronic    Past Surgical History:  Procedure Laterality Date  . ANTERIOR CERVICAL DECOMP/DISCECTOMY FUSION N/A 01/20/2017   Procedure: C6 CORPECTOMY WITH BILATERAL FORAMINOTOMIES C5-6 AND C6-7, FUSION C5-7 ANTERIORLY WITH STRUT ALLOGRAFT AND LOCAL BONE GRAFT, ANTERIOR CERVICAL PLATE AND SCREWS;  Surgeon: Jessy Oto, MD;  Location: [facility];  Service: Orthopedics;  Laterality: N/A;  . BACK SURGERY    . HEMATOMA EVACUATION Right 07/13/2014   Procedure: EVACUATION HEMATOMA RIGHT MIDDLE FINGER;  Surgeon: Mcarthur Rossetti, MD;  Location: [facility];  Service: Orthopedics;  Laterality: Right;  . KNEE SURGERY Left    x2  arthroscopy  . NOSE SURGERY    . POSTERIOR FUSION LUMBAR  SPINE  01/10/2013   Dr Louanne Skye  . WISDOM TOOTH EXTRACTION         Home Medications    Prior to Admission medications   Medication Sig Start Date End Date Taking? Authorizing Provider  acetaminophen (TYLENOL) 500 MG tablet Take 1,000 mg by mouth as needed for moderate pain.     [provider]  albuterol (PROVENTIL HFA;VENTOLIN HFA) 108 (90 Base) MCG/ACT inhaler Inhale 2 puffs into the lungs every 6 (six) hours as needed for wheezing or shortness of breath.    [provider]  Ascorbic Acid (VITAMIN C PO) Take 1 tablet by mouth daily.    [provider]  aspirin EC 325 MG tablet Take 325 mg by mouth daily  as needed for moderate pain.    [provider]  azithromycin (ZITHROMAX) 250 MG tablet Take 1 tablet (250 mg total) by mouth daily. Take first 2 tablets together, then 1 every day until finished. 01/28/17   Sherwood Gambler, MD  baclofen (LIORESAL) 10 MG tablet Take 10 mg by mouth 4 (four) times daily as needed for muscle spasms.    [provider]  Cholecalciferol (VITAMIN D PO) Take 1 tablet by mouth daily.    [provider]  clonazePAM (KLONOPIN) 1 MG tablet Take 1 mg by mouth 2 (two) times daily.    [provider]  Cyanocobalamin (B-12 PO) Take 1 tablet by mouth daily.    [provider]  diazepam (VALIUM) 5 MG tablet Take one tablet po at the MRI and may repeat x1 for anxiety and feelings of claustrophobia. Patient not taking: Reported on 01/15/2017 12/24/16   Jessy Oto, MD  docusate sodium (COLACE) 100 MG capsule Take 1 capsule (100 mg total) by mouth 2 (two) times daily. 01/21/17   Jessy Oto, MD  ibuprofen (ADVIL,MOTRIN) 200 MG tablet Take 800 mg by mouth as needed for moderate pain.    [provider]  methocarbamol (ROBAXIN) 500 MG tablet Take 1 tablet (500 mg total) by mouth every 8 (eight) hours as needed for muscle spasms. 01/21/17   Jessy Oto, MD  metoprolol (TOPROL-XL) 200 MG 24 hr tablet Take 100 mg by mouth 2 (two) times daily.    [provider]  metoprolol tartrate (LOPRESSOR) 100 MG tablet Take 50 mg by mouth 2 (two) times daily as needed (palpitations).     [provider]  Multiple Vitamin (MULTIVITAMIN WITH MINERALS) TABS tablet Take 1 tablet by mouth daily.    [provider]  oxyCODONE (OXYCONTIN) 10 mg 12 hr tablet Take 1 tablet (10 mg total) by mouth every 12 (twelve) hours. 01/21/17   Jessy Oto, MD  oxyCODONE-acetaminophen (PERCOCET) 10-325 MG tablet Take 1-2 tablets by mouth every 4 (four) hours as needed for pain. For break through pain. MAXIMUM TOTAL ACETAMINOPHEN DOSE IS 4000 MG  PER DAY 01/21/17   Jessy Oto, MD  Pyridoxine HCl (B-6 PO) Take 1 tablet by mouth daily.    [provider]  Suvorexant (BELSOMRA) 5 MG TABS Take 5-10 mg by mouth at bedtime as needed (sleep).    [provider]  testosterone cypionate (DEPOTESTOSTERONE CYPIONATE) 200 MG/ML injection Inject 200 mg into the muscle every 7 (seven) days.  05/29/16   [provider]    Family History Family History  Problem Relation Age of Onset  . Hypertension Mother   . Obesity Mother   . Diabetes Mother   . Anxiety disorder Mother   .  Ataxia Neg Hx   . Chorea Neg Hx   . Dementia Neg Hx   . Mental retardation Neg Hx   . Migraines Neg Hx   . Multiple sclerosis Neg Hx   . Neurofibromatosis Neg Hx   . Neuropathy Neg Hx   . Parkinsonism Neg Hx   . Seizures Neg Hx   . Stroke Neg Hx     Social History Social History  Substance Use Topics  . Smoking status: Never Smoker  . Smokeless tobacco: Never Used  . Alcohol use No     Allergies   Antihistamines, chlorpheniramine-type; Lyrica [pregabalin]; Other; Neosporin [neomycin-bacitracin zn-polymyx]; Aspirin; Benadryl [diphenhydramine]; Benztropine; Tape; Gabapentin; and Propranolol   Review of Systems Review of Systems  Constitutional: Negative for fever.  HENT: Positive for congestion and sore throat.   Respiratory: Positive for cough, chest tightness and shortness of breath.   Cardiovascular: Negative for chest pain and leg swelling.  Musculoskeletal: Negative for neck pain.  All other systems reviewed and are negative.    Physical Exam Updated Vital Signs BP 124/89 (BP Location: Right Arm)   Pulse 89   Temp 98.5 F (36.9 C) (Oral)   Resp 18   SpO2 100%   Physical Exam  Constitutional: He is oriented to person, place, and time. He appears well-developed and well-nourished.  HENT:  Head: Normocephalic and atraumatic.  Right Ear: External ear normal.  Left Ear: External ear normal.  Nose: Nose normal.    Eyes: Right eye exhibits no discharge. Left eye exhibits no discharge.  Neck: Normal range of motion. Neck supple.  Left anterior neck wound appears C/D/I. No significant swelling  Cardiovascular: Normal rate, regular rhythm and normal heart sounds.   Pulmonary/Chest: Effort normal and breath sounds normal. He has no wheezes.  Abdominal: Soft. There is no tenderness.  Musculoskeletal: He exhibits no edema.  Neurological: He is alert and oriented to person, place, and time.  Skin: Skin is warm and dry.  Nursing note and vitals reviewed.    ED Treatments / Results  Labs (all labs ordered are listed, but only abnormal results are displayed) Labs Reviewed  BASIC METABOLIC PANEL - Abnormal; Notable for the following:       Result Value   Glucose, Bld 125 (*)    Calcium 8.6 (*)    All other components within normal limits  CBC WITH DIFFERENTIAL/PLATELET - Abnormal; Notable for the following:    RBC 4.13 (*)    HCT 37.8 (*)    All other components within normal limits  D-DIMER, QUANTITATIVE (NOT AT [facility]) - Abnormal; Notable for the following:    D-Dimer, Quant 0.77 (*)    All other components within normal limits  TROPONIN I  CBC WITH DIFFERENTIAL/PLATELET    EKG  EKG Interpretation  Date/Time:  Wednesday January 28 2017 20:16:03 EDT Ventricular Rate:  96 PR Interval:    QRS Duration: 100 QT Interval:  351 QTC Calculation: 444 R Axis:   83 Text Interpretation:  Sinus rhythm Probable left atrial enlargement RSR' in V1 or V2, probably normal variant Borderline T wave abnormalities tachycardia is resolved, otherwise unchanged from Feb 2018 Confirmed by Sherwood Gambler [phone]) on 01/28/2017 8:48:11 PM       Radiology Dg Chest 2 View  Result Date: 01/28/2017 CLINICAL DATA:  Shortness of breath and cough for 2 days EXAM: CHEST  2 VIEW COMPARISON:  July 11, 2016 FINDINGS: The heart size and mediastinal contours are within normal limits. There is no focal infiltrate,  pulmonary edema, or pleural effusion. Patient status post prior fixation of lower cervical spine. IMPRESSION: No active cardiopulmonary disease. Electronically Signed   By: Abelardo Diesel M.D.   On: 01/28/2017 20:46   Ct Angio Chest Pe W/cm &/or Wo Cm  Result Date: 01/28/2017 CLINICAL DATA:  Cough and shortness of breath. PE suspected, intermediate prob, positive D-dimer. Post cervical spine surgery 9 days prior. EXAM: CT ANGIOGRAPHY CHEST WITH CONTRAST TECHNIQUE: Multidetector CT imaging of the chest was performed using the standard protocol during bolus administration of intravenous contrast. Multiplanar CT image reconstructions and MIPs were obtained to evaluate the vascular anatomy. CONTRAST:  100 cc Isovue 370 IV COMPARISON:  Radiograph earlier this day.  Chest CT 07/12/2016 FINDINGS: Cardiovascular: There are no filling defects within the pulmonary arteries to suggest pulmonary embolus. Normal caliber thoracic aorta without dissection or aneurysm. Normal heart size. No pericardial fluid. Mediastinum/Nodes: No mediastinal, hilar, or axillary adenopathy. The esophagus is decompressed. The dominant thyroid nodule. Trachea and mainstem bronchi are patent. Lungs/Pleura: Mild faint nonspecific ground-glass opacities in the left upper lobe and lingula. No confluent consolidation. No pulmonary edema or pleural fluid. No pulmonary mass. Trachea and mainstem bronchi are patent. Upper Abdomen:  No acute abnormality. Musculoskeletal: Lower cervical spine hardware is only partially included. Continued healing of right rib and sternal fractures since prior exam. No acute abnormality. Review of the MIP images confirms the above findings. IMPRESSION: 1. No pulmonary embolus. 2. Faint left upper lobe ground-glass opacities favoring pneumonitis, which may be infectious or inflammatory, however nonspecific. Electronically Signed   By: Jeb Levering M.D.   On: 01/28/2017 22:49    Procedures Procedures (including  critical care time)  Medications Ordered in ED Medications  sodium chloride 0.9 % bolus 1,000 mL (0 mLs Intravenous Stopped 01/28/17 2303)  iopamidol (ISOVUE-370) 76 % injection 100 mL (100 mLs Intravenous Contrast Given 01/28/17 2220)     Initial Impression / Assessment and Plan / ED Course  I have reviewed the triage vital signs and the nursing notes.  Pertinent labs & imaging results that were available during my care of the patient were reviewed by me and considered in my medical decision making (see chart for details).     Workup shows no pulmonary embolism but does show what appears to be a mild pneumonitis. This would likely come for the cough and mild shortness of breath. At this time he does not appear short of breath and has no increased work of breathing. He has ambulated to the bathroom without difficulty. He overall appears well and is not hypoxic. Will discharge with antibiotics, follow-up with PCP and his orthopedist. Discussed return precautions.  Final Clinical Impressions(s) / ED Diagnoses   Final diagnoses:  Pneumonitis    New Prescriptions Discharge Medication List as of 01/28/2017 10:58 PM    START taking these medications   Details  azithromycin (ZITHROMAX) 250 MG tablet Take 1 tablet (250 mg total) by mouth daily. Take first 2 tablets together, then 1 every day until finished., Starting Wed 01/28/2017, Print         Sherwood Gambler, MD 01/28/17 [phone]

## 2017-01-28 NOTE — ED Notes (Signed)
Pt refusing monitor, wants IV removed d/t pain

## 2017-01-28 NOTE — ED Notes (Signed)
Patient claimed that he can breathe better.  He took off his O2 [place].

## 2017-01-28 NOTE — ED Notes (Signed)
Incision to left anterior neck is dry and intact with intact steri strips.

## 2017-01-28 NOTE — ED Notes (Signed)
Patient transported to CT 

## 2017-01-28 NOTE — ED Triage Notes (Signed)
C/o SOB, sweats-cervical spine surgery 9/4-NAD-steady gait

## 2017-01-28 NOTE — ED Notes (Addendum)
Patient c/o difficulty breathing, O2 sat is 100%.  Thermostat decreased from 74 degrees to 68 degrees.  [place] 2 L started.

## 2017-01-28 NOTE — Telephone Encounter (Signed)
Pt called our clinic reports that he has had a cough with SOB x 01/21/17, post surgery on 01/20/17. Advised the pt per Phelps,PA he should go to the ED for an evaluation since he may need a CT scan to r/o PE. Pt verbalized understanding.

## 2017-01-28 NOTE — ED Notes (Signed)
ED Provider at bedside. 

## 2017-01-28 NOTE — ED Notes (Signed)
Patient removed th cardiac monitor stating that he needs to go home.  He has been here since 6 pm.  He even requested that his IV be removed.  Informed patient that I will remove the IV once he is discharge.

## 2017-01-28 NOTE — ED Notes (Signed)
Pt returned from CT °

## 2017-01-29 ENCOUNTER — Ambulatory Visit (INDEPENDENT_AMBULATORY_CARE_PROVIDER_SITE_OTHER): Payer: 59 | Admitting: Surgery

## 2017-02-04 ENCOUNTER — Ambulatory Visit (INDEPENDENT_AMBULATORY_CARE_PROVIDER_SITE_OTHER): Payer: 59

## 2017-02-04 ENCOUNTER — Ambulatory Visit (INDEPENDENT_AMBULATORY_CARE_PROVIDER_SITE_OTHER): Payer: 59 | Admitting: Specialist

## 2017-02-04 ENCOUNTER — Encounter (INDEPENDENT_AMBULATORY_CARE_PROVIDER_SITE_OTHER): Payer: Self-pay | Admitting: Specialist

## 2017-02-04 VITALS — BP 120/73 | HR 67 | Ht 72.0 in | Wt 185.0 lb

## 2017-02-04 DIAGNOSIS — Z981 Arthrodesis status: Secondary | ICD-10-CM | POA: Diagnosis not present

## 2017-02-04 MED ORDER — OXYCODONE-ACETAMINOPHEN 10-325 MG PO TABS: 1.0000 | ORAL_TABLET | ORAL | 0 refills | Status: DC | PRN

## 2017-02-04 NOTE — Patient Instructions (Signed)
Avoid overhead lifting and overhead use of the arms. Do not lift greater than 5 lbs. Adjust head rest in vehicle to prevent hyperextension if rear ended. Take extra precautions to avoid falling, including use of a cane if you feel weak.   

## 2017-02-04 NOTE — Progress Notes (Signed)
Post-Op Visit Note   Patient: Joel Galloway           Date of Birth: [birth year]           MRN: [id number] Visit Date: 02/04/2017 PCP: Tamsen Roers, MD   Assessment & Plan:2 weeks post op C6 corpectomy with C5-6 and C6-7 decompression. Chief Complaint:  Chief Complaint  Patient presents with  . Neck - Routine Post Op  Incision is healed but edges are bunched will level with manipulation.  Visit Diagnoses:  1. Status post cervical spinal fusion     Plan:Avoid overhead lifting and overhead use of the arms. Do not lift greater than 5 lbs. Adjust head rest in vehicle to prevent hyperextension if rear ended. Take extra precautions to avoid falling, including use of a cane if you feel weak.  Follow-Up Instructions: No Follow-up on file.   Orders:  Orders Placed This Encounter  Procedures  . XR Cervical Spine 2 or 3 views   Meds ordered this encounter  Medications  . oxyCODONE-acetaminophen (PERCOCET) 10-325 MG tablet    Sig: Take 1-2 tablets by mouth every 4 (four) hours as needed for pain. For break through pain. MAXIMUM TOTAL ACETAMINOPHEN DOSE IS 4000 MG PER DAY    Dispense:  50 tablet    Refill:  0    Imaging: No results found.  PMFS History: Patient Active Problem List   Diagnosis Date Noted  . Lumbar degenerative disc disease 01/12/2013    Priority: High    Class: Chronic  . HNP (herniated nucleus pulposus), lumbar 01/12/2013    Priority: High    Class: Chronic  . Spinal stenosis, lumbar region, with neurogenic claudication 01/12/2013    Priority: High    Class: Chronic  . S/P cervical spinal fusion 01/20/2017  . Memory difficulties 10/14/2016  . Tachycardia 07/12/2016  . Chest pain 07/12/2016  . Polysubstance abuse 07/12/2016  . SIRS (systemic inflammatory response syndrome) ([place]) 07/12/2016  . Lactic acidosis 07/12/2016  . Anxiety state 07/12/2016  . Abnormal brain MRI 07/08/2016  . Chronic pain syndrome 07/08/2016  . Chronic migraine w/o aura  w/o status migrainosus, not intractable 07/08/2016  . History of optic neuritis 06/02/2016  . Numbness 06/02/2016  . Gait disturbance 06/02/2016  . Urinary frequency 06/02/2016  . Other fatigue 06/02/2016  . Leg cramp 06/02/2016  . Pain in right hand 05/26/2016  . Chronic bilateral low back pain 05/26/2016  . Closed fracture of body of sternum 05/26/2016  . Nail bed injury right middle finger 07/13/2014   Past Medical History:  Diagnosis Date  . Anxiety   . Arthritis   . Asthma   . Cancer ([place])    skin   . Chronic kidney disease    AA      . Claustrophobia   . Complication of anesthesia    pt states he is unable to tol. foleys,due to anatomy  . Coronary artery disease   . Headache    pt reports migraines  . Hypoglycemia    las t episode 12/24/12  . Multiple sclerosis ([place])   . 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 Seasonal allergies   . Seizures ([place])   . Sleep apnea    Pt reports "mild sleep apnea"  no cpap  . Vision abnormalities     Family History  Problem Relation Age of Onset  . Hypertension Mother   . Obesity Mother   . Diabetes Mother   . Anxiety  disorder Mother   . Ataxia Neg Hx   . Chorea Neg Hx   . Dementia Neg Hx   . Mental retardation Neg Hx   . Migraines Neg Hx   . Multiple sclerosis Neg Hx   . Neurofibromatosis Neg Hx   . Neuropathy Neg Hx   . Parkinsonism Neg Hx   . Seizures Neg Hx   . Stroke Neg Hx     Past Surgical History:  Procedure Laterality Date  . ANTERIOR CERVICAL DECOMP/DISCECTOMY FUSION N/A 01/20/2017   Procedure: C6 CORPECTOMY WITH BILATERAL FORAMINOTOMIES C5-6 AND C6-7, FUSION C5-7 ANTERIORLY WITH STRUT ALLOGRAFT AND LOCAL BONE GRAFT, ANTERIOR CERVICAL PLATE AND SCREWS;  Surgeon: Jessy Oto, MD;  Location: [facility];  Service: Orthopedics;  Laterality: N/A;  . BACK SURGERY    . HEMATOMA EVACUATION Right 07/13/2014   Procedure: EVACUATION HEMATOMA RIGHT MIDDLE FINGER;  Surgeon: Mcarthur Rossetti, MD;  Location: [facility];  Service:  Orthopedics;  Laterality: Right;  . KNEE SURGERY Left    x2  arthroscopy  . NOSE SURGERY    . POSTERIOR FUSION LUMBAR SPINE  01/10/2013   Dr Louanne Skye  . WISDOM TOOTH EXTRACTION     Social History   Occupational History  . Not on file.   Social History Main Topics  . Smoking status: Never Smoker  . Smokeless tobacco: Never Used  . Alcohol use No  . Drug use: No  . Sexual activity: Not on file

## 2017-02-11 ENCOUNTER — Ambulatory Visit (INDEPENDENT_AMBULATORY_CARE_PROVIDER_SITE_OTHER): Payer: 59

## 2017-02-11 ENCOUNTER — Encounter (INDEPENDENT_AMBULATORY_CARE_PROVIDER_SITE_OTHER): Payer: Self-pay | Admitting: Specialist

## 2017-02-11 ENCOUNTER — Ambulatory Visit (INDEPENDENT_AMBULATORY_CARE_PROVIDER_SITE_OTHER): Payer: 59 | Admitting: Specialist

## 2017-02-11 VITALS — BP 104/70 | HR 69 | Ht 72.0 in | Wt 185.0 lb

## 2017-02-11 DIAGNOSIS — M5441 Lumbago with sciatica, right side: Secondary | ICD-10-CM

## 2017-02-11 DIAGNOSIS — M48061 Spinal stenosis, lumbar region without neurogenic claudication: Secondary | ICD-10-CM

## 2017-02-11 DIAGNOSIS — M5416 Radiculopathy, lumbar region: Secondary | ICD-10-CM

## 2017-02-11 DIAGNOSIS — Z981 Arthrodesis status: Secondary | ICD-10-CM

## 2017-02-11 MED ORDER — OXYCODONE-ACETAMINOPHEN 10-325 MG PO TABS: 1.0000 | ORAL_TABLET | ORAL | 0 refills | Status: DC | PRN

## 2017-02-11 MED ORDER — METHYLPREDNISOLONE 4 MG PO TBPK: ORAL_TABLET | ORAL | 0 refills | Status: DC

## 2017-02-11 NOTE — Progress Notes (Signed)
Office Visit Note   Patient: Joel Galloway           Date of Birth: 31-Jul-1960           MRN: [id number] Visit Date: 02/11/2017              Requested by: Tamsen Roers, [place], [place] [address] PCP: Tamsen Roers, MD   Assessment & Plan: Visit Diagnoses:  1. Right-sided low back pain with right-sided sciatica, unspecified chronicity   2. Radiculopathy, lumbar region   3. Degenerative lumbar spinal stenosis   4. Status post cervical spinal fusion     Plan: Avoid bending, stooping and avoid lifting weights greater than 10 lbs. Avoid prolong standing and walking. Avoid frequent bending and stooping  No lifting greater than 10 lbs. May use ice or moist heat for pain. Weight loss is of benefit. Handicap license is approved.  Follow-Up Instructions: Return in about 3 weeks (around 03/04/2017).   Orders:  Orders Placed This Encounter  Procedures  . XR Lumbar Spine 2-3 Views  . MR Lumbar Spine W Wo Contrast   Meds ordered this encounter  Medications  . oxyCODONE-acetaminophen (PERCOCET) 10-325 MG tablet    Sig: Take 1-2 tablets by mouth every 4 (four) hours as needed for pain. For break through pain. MAXIMUM TOTAL ACETAMINOPHEN DOSE IS 4000 MG PER DAY    Dispense:  50 tablet    Refill:  0      Procedures: No procedures performed   Clinical Data: No additional findings.   Subjective: Chief Complaint  Patient presents with  . Lower Back - Pain    HPI  Review of Systems   Objective: Vital Signs: BP 104/70 (BP Location: Left Arm, Patient Position: Sitting)   Pulse 69   Ht 6' (1.829 m)   Wt 185 lb (83.9 kg)   BMI 25.09 kg/m   Physical Exam  Ortho Exam  Specialty Comments:  No specialty comments available.  Imaging: Xr Lumbar Spine 2-3 Views  Result Date: 02/11/2017 AP and lateral flexion and extension radiographs lumbar spine, Narrowing of the disc at L4-5 and L3-4 above the L5-S1 fusion. Hardware and interbody fusion is healed  and appears be stable.     PMFS History: Patient Active Problem List   Diagnosis Date Noted  . Lumbar degenerative disc disease 01/12/2013    Priority: High    Class: Chronic  . HNP (herniated nucleus pulposus), lumbar 01/12/2013    Priority: High    Class: Chronic  . Spinal stenosis, lumbar region, with neurogenic claudication 01/12/2013    Priority: High    Class: Chronic  . S/P cervical spinal fusion 01/20/2017  . Memory difficulties 10/14/2016  . Tachycardia 07/12/2016  . Chest pain 07/12/2016  . Polysubstance abuse 07/12/2016  . SIRS (systemic inflammatory response syndrome) ([place]) 07/12/2016  . Lactic acidosis 07/12/2016  . Anxiety state 07/12/2016  . Abnormal brain MRI 07/08/2016  . Chronic pain syndrome 07/08/2016  . Chronic migraine w/o aura w/o status migrainosus, not intractable 07/08/2016  . History of optic neuritis 06/02/2016  . Numbness 06/02/2016  . Gait disturbance 06/02/2016  . Urinary frequency 06/02/2016  . Other fatigue 06/02/2016  . Leg cramp 06/02/2016  . Pain in right hand 05/26/2016  . Chronic bilateral low back pain 05/26/2016  . Closed fracture of body of sternum 05/26/2016  . Nail bed injury right middle finger 07/13/2014   Past Medical History:  Diagnosis Date  . Anxiety   .  Arthritis   . Asthma   . Cancer ([place])    skin   . Chronic kidney disease    AA      . Claustrophobia   . Complication of anesthesia    pt states he is unable to tol. foleys,due to anatomy  . Coronary artery disease   . Headache    pt reports migraines  . Hypoglycemia    las t episode 12/24/12  . Multiple sclerosis ([place])   . 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 Seasonal allergies   . Seizures ([place])   . Sleep apnea    Pt reports "mild sleep apnea"  no cpap  . Vision abnormalities     Family History  Problem Relation Age of Onset  . Hypertension Mother   . Obesity Mother   . Diabetes Mother   . Anxiety disorder Mother   . Ataxia Neg Hx   . Chorea Neg  Hx   . Dementia Neg Hx   . Mental retardation Neg Hx   . Migraines Neg Hx   . Multiple sclerosis Neg Hx   . Neurofibromatosis Neg Hx   . Neuropathy Neg Hx   . Parkinsonism Neg Hx   . Seizures Neg Hx   . Stroke Neg Hx     Past Surgical History:  Procedure Laterality Date  . ANTERIOR CERVICAL DECOMP/DISCECTOMY FUSION N/A 01/20/2017   Procedure: C6 CORPECTOMY WITH BILATERAL FORAMINOTOMIES C5-6 AND C6-7, FUSION C5-7 ANTERIORLY WITH STRUT ALLOGRAFT AND LOCAL BONE GRAFT, ANTERIOR CERVICAL PLATE AND SCREWS;  Surgeon: Jessy Oto, MD;  Location: [facility];  Service: Orthopedics;  Laterality: N/A;  . BACK SURGERY    . HEMATOMA EVACUATION Right 07/13/2014   Procedure: EVACUATION HEMATOMA RIGHT MIDDLE FINGER;  Surgeon: Mcarthur Rossetti, MD;  Location: [facility];  Service: Orthopedics;  Laterality: Right;  . KNEE SURGERY Left    x2  arthroscopy  . NOSE SURGERY    . POSTERIOR FUSION LUMBAR SPINE  01/10/2013   Dr Louanne Skye  . WISDOM TOOTH EXTRACTION     Social History   Occupational History  . Not on file.   Social History Main Topics  . Smoking status: Never Smoker  . Smokeless tobacco: Never Used  . Alcohol use No  . Drug use: No  . Sexual activity: Not on file

## 2017-02-11 NOTE — Patient Instructions (Signed)
Avoid bending, stooping and avoid lifting weights greater than 10 lbs. Avoid prolong standing and walking. Avoid frequent bending and stooping  No lifting greater than 10 lbs. May use ice or moist heat for pain. Weight loss is of benefit. Handicap license is approved.  

## 2017-02-11 NOTE — Addendum Note (Signed)
Addended by: [name] on: 02/11/2017 12:27 PM   Modules accepted: Orders

## 2017-02-23 ENCOUNTER — Telehealth (INDEPENDENT_AMBULATORY_CARE_PROVIDER_SITE_OTHER): Payer: Self-pay | Admitting: Specialist

## 2017-02-23 NOTE — Telephone Encounter (Signed)
Pt needs prescription filled (pain level 10 )  Please call as soon as possible to the pharmacy to realize the medicine.

## 2017-02-23 NOTE — Telephone Encounter (Signed)
Patient called asked for a call back concerning trying to get his Rx refilled (percocet)  Patient said the pharmacy will not fill the Rx without speaking to Dr Otho Ket office. The number to contact patient is [phone]

## 2017-02-23 NOTE — Telephone Encounter (Signed)
Sent message to Dr. Louanne Skye

## 2017-02-23 NOTE — Telephone Encounter (Signed)
Message 2: Pt needs prescription filled (pain level 10 )  Please call as soon as possible to the pharmacy to realize the medicine.       Message 1: Patient called asked for a call back concerning trying to get his Rx refilled (percocet)  Patient said the pharmacy will not fill the Rx without speaking to Dr Otho Ket office. The number to contact patient is [phone]

## 2017-02-24 ENCOUNTER — Other Ambulatory Visit (INDEPENDENT_AMBULATORY_CARE_PROVIDER_SITE_OTHER): Payer: Self-pay | Admitting: Specialist

## 2017-02-24 MED ORDER — HYDROCODONE-ACETAMINOPHEN 10-325 MG PO TABS: 1.0000 | ORAL_TABLET | Freq: Four times a day (QID) | ORAL | 0 refills | Status: DC | PRN

## 2017-02-24 NOTE — Telephone Encounter (Signed)
One month post surgery should not need the same amount as immediately post op, stopping oxycodone, change to hydrocodone 10 mg every 6 hours. #40

## 2017-02-25 ENCOUNTER — Telehealth (INDEPENDENT_AMBULATORY_CARE_PROVIDER_SITE_OTHER): Payer: Self-pay | Admitting: Specialist

## 2017-02-25 ENCOUNTER — Ambulatory Visit
Admission: RE | Admit: 2017-02-25 | Discharge: 2017-02-25 | Disposition: A | Discharge status: Home / Self Care | Payer: 59 | Source: Ambulatory Visit | Attending: Specialist | Admitting: Specialist

## 2017-02-25 DIAGNOSIS — M5416 Radiculopathy, lumbar region: Secondary | ICD-10-CM

## 2017-02-25 DIAGNOSIS — M5441 Lumbago with sciatica, right side: Secondary | ICD-10-CM

## 2017-02-25 DIAGNOSIS — M48061 Spinal stenosis, lumbar region without neurogenic claudication: Secondary | ICD-10-CM

## 2017-02-25 NOTE — Telephone Encounter (Signed)
Per patient request a call back asap, states he was not able to have the mri done this morning due to the severe pain in was in.

## 2017-02-25 NOTE — Telephone Encounter (Signed)
I spoke with patient, he states that the pain that was having is a nerve pain.  He wants to know if he can have the MRI @ [facility] under sedation?  Please advise

## 2017-02-25 NOTE — Telephone Encounter (Signed)
I spoke with patient and advised the his rx was ready for pick up however Dr. Louanne Skye reduced it to Hydrocodone 10 mg.

## 2017-02-26 NOTE — Telephone Encounter (Signed)
I do not mind if he wants to undergo the MRI under a General Anesthesia. I do not have privileges at [facility] and if he needs a general anesthestic I would prefer that be done at [facility]

## 2017-02-26 NOTE — Telephone Encounter (Signed)
Can we get this done? See Message below

## 2017-02-27 NOTE — Telephone Encounter (Signed)
Called and left message informing pt I will send out an order to [facility] radiology anesthesia so they can call and schedule appt.

## 2017-03-03 ENCOUNTER — Ambulatory Visit (INDEPENDENT_AMBULATORY_CARE_PROVIDER_SITE_OTHER): Payer: 59 | Admitting: Specialist

## 2017-03-03 ENCOUNTER — Ambulatory Visit (INDEPENDENT_AMBULATORY_CARE_PROVIDER_SITE_OTHER): Payer: 59

## 2017-03-03 ENCOUNTER — Other Ambulatory Visit (INDEPENDENT_AMBULATORY_CARE_PROVIDER_SITE_OTHER): Payer: Self-pay | Admitting: Specialist

## 2017-03-03 VITALS — BP 136/87 | HR 93 | Ht 72.0 in | Wt 185.0 lb

## 2017-03-03 DIAGNOSIS — Z981 Arthrodesis status: Secondary | ICD-10-CM

## 2017-03-03 DIAGNOSIS — M5441 Lumbago with sciatica, right side: Secondary | ICD-10-CM

## 2017-03-03 MED ORDER — HYDROCODONE-ACETAMINOPHEN 10-325 MG PO TABS: 1.0000 | ORAL_TABLET | Freq: Four times a day (QID) | ORAL | 0 refills | Status: DC | PRN

## 2017-03-03 NOTE — Telephone Encounter (Signed)
Received call from [facility] at [facility] stating pt called wanting to schedule MRI but needed to be put to sleep and needed the order form for anesthia in order to schedule pt. I called and left message for Trula Ore asking for a return call to inform her that I have already faxed order for this but if needed another I will refax.

## 2017-03-03 NOTE — Progress Notes (Signed)
Post-Op Visit Note   Patient: Joel Galloway           Date of Birth: 10/04/1960           MRN: [id number] Visit Date: 03/03/2017 PCP: Tamsen Roers, MD   Assessment & Plan:6 weeks post C6 corpectomy and fusion C5-C7 with struit graft, plates and screws.Minimal motion at the fusion site 1.7 mm on lateral dynamic radiographs.    Chief Complaint:  Chief Complaint  Patient presents with  . Neck - Routine Post Op   Visit Diagnoses:  1. Status post cervical spinal fusion   2. Low back pain with right-sided sciatica, unspecified back pain laterality, unspecified chronicity   Incision is  Plan: Avoid overhead lifting and overhead use of the arms. Do not lift greater than 5 lbs. Adjust head rest in vehicle to prevent hyperextension if rear ended.  We are ordering a lumbar myelogram and post myelogram CT scan as you were unable to  Be flat long enough for a MRI.  Walking in a swimming pool can be done and I would allow for the removal of your soft collar. Change to a soft collar for full time use except in a pool and in a shower.   Follow-Up Instructions: Return in about 10 days (around 03/13/2017).   Orders:  Orders Placed This Encounter  Procedures  . XR Cervical Spine 2 or 3 views  . XR Cervical Spine 2 or 3 views  . DG Myelogram Lumbar  . CT LUMBAR SPINE W CONTRAST   No orders of the defined types were placed in this encounter.   Imaging: No results found.  PMFS History: Patient Active Problem List   Diagnosis Date Noted  . Lumbar degenerative disc disease 01/12/2013    Priority: High    Class: Chronic  . HNP (herniated nucleus pulposus), lumbar 01/12/2013    Priority: High    Class: Chronic  . Spinal stenosis, lumbar region, with neurogenic claudication 01/12/2013    Priority: High    Class: Chronic  . S/P cervical spinal fusion 01/20/2017  . Memory difficulties 10/14/2016  . Tachycardia 07/12/2016  . Chest pain 07/12/2016  . Polysubstance abuse ([place])  07/12/2016  . SIRS (systemic inflammatory response syndrome) ([place]) 07/12/2016  . Lactic acidosis 07/12/2016  . Anxiety state 07/12/2016  . Abnormal brain MRI 07/08/2016  . Chronic pain syndrome 07/08/2016  . Chronic migraine w/o aura w/o status migrainosus, not intractable 07/08/2016  . History of optic neuritis 06/02/2016  . Numbness 06/02/2016  . Gait disturbance 06/02/2016  . Urinary frequency 06/02/2016  . Other fatigue 06/02/2016  . Leg cramp 06/02/2016  . Pain in right hand 05/26/2016  . Chronic bilateral low back pain 05/26/2016  . Closed fracture of body of sternum 05/26/2016  . Nail bed injury right middle finger 07/13/2014   Past Medical History:  Diagnosis Date  . Anxiety   . Arthritis   . Asthma   . Cancer ([place])    skin   . Chronic kidney disease    AA      . Claustrophobia   . Complication of anesthesia    pt states he is unable to tol. foleys,due to anatomy  . Coronary artery disease   . Headache    pt reports migraines  . Hypoglycemia    las t episode 12/24/12  . Multiple sclerosis ([place])   . 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 Seasonal allergies   . Seizures ([place])   .  Sleep apnea    Pt reports "mild sleep apnea"  no cpap  . Vision abnormalities     Family History  Problem Relation Age of Onset  . Hypertension Mother   . Obesity Mother   . Diabetes Mother   . Anxiety disorder Mother   . Ataxia Neg Hx   . Chorea Neg Hx   . Dementia Neg Hx   . Mental retardation Neg Hx   . Migraines Neg Hx   . Multiple sclerosis Neg Hx   . Neurofibromatosis Neg Hx   . Neuropathy Neg Hx   . Parkinsonism Neg Hx   . Seizures Neg Hx   . Stroke Neg Hx     Past Surgical History:  Procedure Laterality Date  . ANTERIOR CERVICAL DECOMP/DISCECTOMY FUSION N/A 01/20/2017   Procedure: C6 CORPECTOMY WITH BILATERAL FORAMINOTOMIES C5-6 AND C6-7, FUSION C5-7 ANTERIORLY WITH STRUT ALLOGRAFT AND LOCAL BONE GRAFT, ANTERIOR CERVICAL PLATE AND SCREWS;  Surgeon: Jessy Oto,  MD;  Location: [facility];  Service: Orthopedics;  Laterality: N/A;  . BACK SURGERY    . HEMATOMA EVACUATION Right 07/13/2014   Procedure: EVACUATION HEMATOMA RIGHT MIDDLE FINGER;  Surgeon: Mcarthur Rossetti, MD;  Location: [facility];  Service: Orthopedics;  Laterality: Right;  . KNEE SURGERY Left    x2  arthroscopy  . NOSE SURGERY    . POSTERIOR FUSION LUMBAR SPINE  01/10/2013   Dr Louanne Skye  . WISDOM TOOTH EXTRACTION     Social History   Occupational History  . Not on file.   Social History Main Topics  . Smoking status: Never Smoker  . Smokeless tobacco: Never Used  . Alcohol use No  . Drug use: No  . Sexual activity: Not on file

## 2017-03-03 NOTE — Patient Instructions (Signed)
Avoid overhead lifting and overhead use of the arms. Do not lift greater than 5 lbs. Adjust head rest in vehicle to prevent hyperextension if rear ended.  We are ordering a lumbar myelogram and post myelogram CT scan as you were unable to  Be flat long enough for a MRI.  Walking in a swimming pool can be done and I would allow for the removal of your soft collar. Change to a soft collar for full time use except in a pool and in a shower.

## 2017-03-03 NOTE — Telephone Encounter (Signed)
sw felicia at [facility] radiology and stated pt has to have at least recent 30 days ov  And reason why needing sedation and states they are booked until November. Also, include my number in case she needs to contact me for other questions.

## 2017-03-03 NOTE — Telephone Encounter (Signed)
Per Dr. Louanne Skye: We are ordering a lumbar myelogram and post myelogram CT scan as you were unable to  Be flat long enough for a MRI.

## 2017-03-04 ENCOUNTER — Ambulatory Visit (INDEPENDENT_AMBULATORY_CARE_PROVIDER_SITE_OTHER): Payer: 59 | Admitting: Specialist

## 2017-03-10 ENCOUNTER — Ambulatory Visit
Admission: RE | Admit: 2017-03-10 | Discharge: 2017-03-10 | Disposition: A | Discharge status: Home / Self Care | Payer: 59 | Source: Ambulatory Visit | Attending: Specialist | Admitting: Specialist

## 2017-03-10 VITALS — BP 98/54 | HR 73

## 2017-03-10 DIAGNOSIS — M5441 Lumbago with sciatica, right side: Secondary | ICD-10-CM

## 2017-03-10 DIAGNOSIS — M5126 Other intervertebral disc displacement, lumbar region: Secondary | ICD-10-CM

## 2017-03-10 DIAGNOSIS — M48062 Spinal stenosis, lumbar region with neurogenic claudication: Secondary | ICD-10-CM

## 2017-03-10 DIAGNOSIS — M5136 Other intervertebral disc degeneration, lumbar region: Secondary | ICD-10-CM

## 2017-03-10 MED ORDER — ONDANSETRON HCL 4 MG/2ML IJ SOLN: 4.0000 mg | Freq: Four times a day (QID) | INTRAMUSCULAR | Status: DC | PRN

## 2017-03-10 MED ORDER — DIAZEPAM 5 MG PO TABS
10.0000 mg | ORAL_TABLET | Freq: Once | ORAL | Status: AC
  Administered 2017-03-10: 5 mg via ORAL

## 2017-03-10 MED ORDER — MEPERIDINE HCL 100 MG/ML IJ SOLN
75.0000 mg | Freq: Once | INTRAMUSCULAR | Status: AC
  Administered 2017-03-10: 75 mg via INTRAMUSCULAR

## 2017-03-10 MED ORDER — ONDANSETRON HCL 4 MG/2ML IJ SOLN
4.0000 mg | Freq: Once | INTRAMUSCULAR | Status: AC
Start: 2017-03-10 — End: 2017-03-10
  Administered 2017-03-10: 4 mg via INTRAMUSCULAR

## 2017-03-10 MED ORDER — IOPAMIDOL (ISOVUE-M 200) INJECTION 41%
15.0000 mL | Freq: Once | INTRAMUSCULAR | Status: AC
  Administered 2017-03-10: 15 mL via INTRATHECAL

## 2017-03-10 NOTE — Discharge Instructions (Signed)

## 2017-03-13 ENCOUNTER — Telehealth (INDEPENDENT_AMBULATORY_CARE_PROVIDER_SITE_OTHER): Payer: Self-pay | Admitting: Specialist

## 2017-03-13 ENCOUNTER — Encounter (INDEPENDENT_AMBULATORY_CARE_PROVIDER_SITE_OTHER): Payer: Self-pay | Admitting: Specialist

## 2017-03-13 ENCOUNTER — Ambulatory Visit (INDEPENDENT_AMBULATORY_CARE_PROVIDER_SITE_OTHER): Payer: 59 | Admitting: Specialist

## 2017-03-13 VITALS — BP 122/80 | HR 63 | Ht 72.0 in | Wt 185.0 lb

## 2017-03-13 DIAGNOSIS — M4326 Fusion of spine, lumbar region: Secondary | ICD-10-CM

## 2017-03-13 DIAGNOSIS — M5126 Other intervertebral disc displacement, lumbar region: Secondary | ICD-10-CM

## 2017-03-13 DIAGNOSIS — M5440 Lumbago with sciatica, unspecified side: Secondary | ICD-10-CM

## 2017-03-13 DIAGNOSIS — M5136 Other intervertebral disc degeneration, lumbar region: Secondary | ICD-10-CM

## 2017-03-13 DIAGNOSIS — M4322 Fusion of spine, cervical region: Secondary | ICD-10-CM

## 2017-03-13 DIAGNOSIS — Z969 Presence of functional implant, unspecified: Secondary | ICD-10-CM

## 2017-03-13 DIAGNOSIS — M5116 Intervertebral disc disorders with radiculopathy, lumbar region: Secondary | ICD-10-CM

## 2017-03-13 NOTE — Telephone Encounter (Signed)
Haven't heard anything about another surgery.

## 2017-03-13 NOTE — Telephone Encounter (Signed)
Patient was to schedule rov 4 weeks, the schedule doesn't allow until 12/7. Please call about the 4 week follow up [phone]

## 2017-03-13 NOTE — Telephone Encounter (Signed)
Patient is going out [place] the country tomorrow morning, needs to schedule cervical surgery for December and needs a call ASAP before he leaves.callback [phone]

## 2017-03-13 NOTE — Progress Notes (Addendum)
Post-Op Visit Note   Patient: Joel Galloway           Date of Birth: 1961/03/02           MRN: [id number] Visit Date: 03/13/2017 PCP: Tamsen Roers, MD   Assessment & Plan:7 weekspost cervical fusion this is not painful, soft collar  Chief Complaint:  Chief Complaint  Patient presents with  . Lower Back - Follow-up    CT Myelogram Review   Visit Diagnoses:  1. Acute right-sided low back pain with sciatica, sciatica laterality unspecified   2. Cervical vertebral fusion   3. Degenerative disc disease, lumbar   4. Herniation of nucleus pulposus of lumbar intervertebral disc with sciatica   5. Fusion of spine of lumbar region   6. Presence of retained hardware   56 year old male returns with complaints of low back pain with radiation into the right anterolateral thigh and anterior right shin. Pain worse with standing and with walking. He has pain with bending and stooping. History of previous L5-S1 fusion in 2014 for intractable lumbar discogenic pain due to severe degenerative dis disease L5-S1. Persistent pain into the lumbar spine with right anterolateral thigh pain and radiation into the right anterior shin. He has undergone recent cervical corpectomy C6 for cervical stenosis and foramenal stenosis  And is nearly 7 weeks post op. He has no neck complains, reports the neck and arm pains are gone. No upper extremity numbness or weakness. Almost all of his pain is right lumbar and associated with the right leg pain. The results of MRI of the lumbar are available for review.  Plan:Avoid frequent bending and stooping  No lifting greater than 10 lbs. May use ice or moist heat for pain. Weight loss is of benefit. Handicap license is approved.  Avoid overhead lifting and overhead use of the arms. Do not lift greater than 5 lbs. Adjust head rest in vehicle to prevent hyperextension if rear ended. Take extra precautions to avoid falling, including use of a cane if you feel  weak. Surgery scheduling secretary Kandice Hams, will call you in the next week to schedule for surgery.  Surgery recommended is a one level lumbar fusion right TLIF L4-5 and removal of screws at S1 bilateral, the fusion would be done with rods, screws and cage with local bone graft and allograft (donor bone graft). Take hydrocodone for for pain. Risk of surgery includes risk of infection 1 in 300 patients, bleeding 1/2% chance you would need a transfusion.   Risk to the nerves is one in 10,000. You will need to use a brace for 3 months and wean from the brace on the 4th month. Expect improved walking and standing tolerance. Expect relief of leg pain but numbness may persist depending on the length and degree of pressure that has been present Follow-Up Instructions: Return in about 4 weeks (around 04/10/2017).   Orders:  No orders of the defined types were placed in this encounter.  No orders of the defined types were placed in this encounter.   Imaging:3 No results found.  PMFS History: Patient Active Problem List   Diagnosis Date Noted  . Lumbar degenerative disc disease 01/12/2013    Priority: High    Class: Chronic  . HNP (herniated nucleus pulposus), lumbar 01/12/2013    Priority: High    Class: Chronic  . Spinal stenosis, lumbar region, with neurogenic claudication 01/12/2013    Priority: High    Class: Chronic  . S/P cervical spinal  fusion 01/20/2017  . Memory difficulties 10/14/2016  . Tachycardia 07/12/2016  . Chest pain 07/12/2016  . Polysubstance abuse ([place]) 07/12/2016  . SIRS (systemic inflammatory response syndrome) ([place]) 07/12/2016  . Lactic acidosis 07/12/2016  . Anxiety state 07/12/2016  . Abnormal brain MRI 07/08/2016  . Chronic pain syndrome 07/08/2016  . Chronic migraine w/o aura w/o status migrainosus, not intractable 07/08/2016  . History of optic neuritis 06/02/2016  . Numbness 06/02/2016  . Gait disturbance 06/02/2016  . Urinary frequency  06/02/2016  . Other fatigue 06/02/2016  . Leg cramp 06/02/2016  . Pain in right hand 05/26/2016  . Chronic bilateral low back pain 05/26/2016  . Closed fracture of body of sternum 05/26/2016  . Nail bed injury right middle finger 07/13/2014   Past Medical History:  Diagnosis Date  . Anxiety   . Arthritis   . Asthma   . Cancer ([place])    skin   . Chronic kidney disease    AA      . Claustrophobia   . Complication of anesthesia    pt states he is unable to tol. foleys,due to anatomy  . Coronary artery disease   . Headache    pt reports migraines  . Hypoglycemia    las t episode 12/24/12  . Multiple sclerosis ([place])   . 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 Seasonal allergies   . Seizures ([place])   . Sleep apnea    Pt reports "mild sleep apnea"  no cpap  . Vision abnormalities     Family History  Problem Relation Age of Onset  . Hypertension Mother   . Obesity Mother   . Diabetes Mother   . Anxiety disorder Mother   . Ataxia Neg Hx   . Chorea Neg Hx   . Dementia Neg Hx   . Mental retardation Neg Hx   . Migraines Neg Hx   . Multiple sclerosis Neg Hx   . Neurofibromatosis Neg Hx   . Neuropathy Neg Hx   . Parkinsonism Neg Hx   . Seizures Neg Hx   . Stroke Neg Hx     Past Surgical History:  Procedure Laterality Date  . ANTERIOR CERVICAL DECOMP/DISCECTOMY FUSION N/A 01/20/2017   Procedure: C6 CORPECTOMY WITH BILATERAL FORAMINOTOMIES C5-6 AND C6-7, FUSION C5-7 ANTERIORLY WITH STRUT ALLOGRAFT AND LOCAL BONE GRAFT, ANTERIOR CERVICAL PLATE AND SCREWS;  Surgeon: Jessy Oto, MD;  Location: [facility];  Service: Orthopedics;  Laterality: N/A;  . BACK SURGERY    . HEMATOMA EVACUATION Right 07/13/2014   Procedure: EVACUATION HEMATOMA RIGHT MIDDLE FINGER;  Surgeon: Mcarthur Rossetti, MD;  Location: [facility];  Service: Orthopedics;  Laterality: Right;  . KNEE SURGERY Left    x2  arthroscopy  . NOSE SURGERY    . POSTERIOR FUSION LUMBAR SPINE  01/10/2013   Dr Louanne Skye  . WISDOM TOOTH  EXTRACTION     Social History   Occupational History  . Not on file.   Social History Main Topics  . Smoking status: Never Smoker  . Smokeless tobacco: Never Used  . Alcohol use No  . Drug use: No  . Sexual activity: Not on file

## 2017-03-13 NOTE — Patient Instructions (Addendum)
Avoid frequent bending and stooping  No lifting greater than 10 lbs. May use ice or moist heat for pain. Weight loss is of benefit. Handicap license is approved.  Avoid overhead lifting and overhead use of the arms. Do not lift greater than 5 lbs. Adjust head rest in vehicle to prevent hyperextension if rear ended. Take extra precautions to avoid falling, including use of a cane if you feel weak. Avoid bending, stooping and avoid lifting weights greater than 10 lbs.  Surgery scheduling secretary Kandice Hams, will call you in the next week to schedule for surgery.  Surgery recommended is a one level lumbar fusion right TLIF L4-5 and removal of screws at S1 bilateral, the fusion would be done with rods, screws and cage with local bone graft and allograft (donor bone graft). Take hydrocodone for for pain. Risk of surgery includes risk of infection 1 in 300 patients, bleeding 1/2% chance you would need a transfusion.   Risk to the nerves is one in 10,000. You will need to use a brace for 3 months and wean from the brace on the 4th month. Expect improved walking and standing tolerance. Expect relief of leg pain but numbness may persist depending on the length and degree of pressure that has been present.

## 2017-03-13 NOTE — Telephone Encounter (Signed)
I just got the blue sheet and I wil put it in your door.

## 2017-03-16 ENCOUNTER — Telehealth (INDEPENDENT_AMBULATORY_CARE_PROVIDER_SITE_OTHER): Payer: Self-pay | Admitting: Radiology

## 2017-03-16 NOTE — Telephone Encounter (Signed)
-----   Message from Joel Oto, MD sent at 03/13/2017  4:10 PM EDT ----- I spoke with pharmacist and she noted that [name] was requesting filling of a prescription for medication that we had provided 01/27/2017. He had filled a prescription for oxycodone and oxycontin at the time of discharge from hospital 01/21/2017. Then seen in our office and prescribed hydrocodone 10/325. More recently he  Received a prescription for oxycodone #140 from Dr. Rex Galloway on 10/11.  Seen in my office today no Rx was given and I had ask if he needed  Any further medications as he is planning to leave the country to go To [place] with his wife to visit her family. Concerning the old  Prescription that he was requesting [facility] fill, I recommended voiding the old Rx from 01/2017 as it has been over a Month since it was written. I will not prescribe predated narcotic med  Prescriptions for this patient and will request that Dr. Rex Galloway refrain from providing narcotics during the perioperative period that we are  Treating him for pain. I tried to call Dr. Eddie Galloway office no answering machine and no person available they are closed today, will try again on Monday 10/29.

## 2017-03-16 NOTE — Telephone Encounter (Signed)
Reminder:  Call Dr. Eddie Dibbles office regarding this patient

## 2017-03-17 NOTE — Telephone Encounter (Signed)
Patient has appt on 04/06/2017--he can keep that appt.

## 2017-04-06 ENCOUNTER — Ambulatory Visit (INDEPENDENT_AMBULATORY_CARE_PROVIDER_SITE_OTHER): Payer: 59 | Admitting: Specialist

## 2017-04-06 ENCOUNTER — Ambulatory Visit (INDEPENDENT_AMBULATORY_CARE_PROVIDER_SITE_OTHER): Payer: Self-pay

## 2017-04-06 ENCOUNTER — Encounter (INDEPENDENT_AMBULATORY_CARE_PROVIDER_SITE_OTHER): Payer: Self-pay | Admitting: Specialist

## 2017-04-06 VITALS — BP 120/78 | HR 72 | Ht 72.0 in | Wt 185.0 lb

## 2017-04-06 DIAGNOSIS — M4322 Fusion of spine, cervical region: Secondary | ICD-10-CM | POA: Diagnosis not present

## 2017-04-06 MED ORDER — VITAMIN D (ERGOCALCIFEROL) 1.25 MG (50000 UNIT) PO CAPS: 50000.0000 [IU] | ORAL_CAPSULE | ORAL | 0 refills | Status: DC

## 2017-04-06 NOTE — Progress Notes (Signed)
Post-Op Visit Note   Patient: Joel Galloway           Date of Birth: 1960-07-22           MRN: [id number] Visit Date: 04/06/2017 PCP: Tamsen Roers, MD   Assessment & Plan: 2 1/2 months post C5-C7 fusion with vertebrectomy C6. No pain but persisting motion indicating that this segment is not healed.   Chief Complaint:  Chief Complaint  Patient presents with  . Neck - Routine Post Op  . Lower Back - Follow-up   Visit Diagnoses:  1. Cervical vertebral fusion     Plan: Avoid overhead lifting and overhead use of the arms. Do not lift greater than 5 lbs. Adjust head rest in vehicle to prevent hyperextension if rear ended. We will order an external osteostimulator to attempt to improve the healing of the fusion. Vitamin D 50,000 IU q 7 days for 4 doses then vitamin D 2,000 IU daily and Calcium Citrate 200 mg BID. Try to wear the cervica brace as often as possible and when in a car. Stop arthritis medications like motrin and meloxicam.  Follow-Up Instructions: No Follow-up on file.   Orders:  Orders Placed This Encounter  Procedures  . XR Cervical Spine 2 or 3 views   No orders of the defined types were placed in this encounter.   Imaging: Xr Cervical Spine 2 Or 3 Views  Result Date: 04/06/2017 There is persisting motion across the posterior spinous processes C5 to C7 measurements at 48.8 in flexion and 43.8 in extension.   PMFS History: Patient Active Problem List   Diagnosis Date Noted  . Lumbar degenerative disc disease 01/12/2013    Priority: High    Class: Chronic  . HNP (herniated nucleus pulposus), lumbar 01/12/2013    Priority: High    Class: Chronic  . Spinal stenosis, lumbar region, with neurogenic claudication 01/12/2013    Priority: High    Class: Chronic  . S/P cervical spinal fusion 01/20/2017  . Memory difficulties 10/14/2016  . Tachycardia 07/12/2016  . Chest pain 07/12/2016  . Polysubstance abuse ([place]) 07/12/2016  . SIRS (systemic  inflammatory response syndrome) ([place]) 07/12/2016  . Lactic acidosis 07/12/2016  . Anxiety state 07/12/2016  . Abnormal brain MRI 07/08/2016  . Chronic pain syndrome 07/08/2016  . Chronic migraine w/o aura w/o status migrainosus, not intractable 07/08/2016  . History of optic neuritis 06/02/2016  . Numbness 06/02/2016  . Gait disturbance 06/02/2016  . Urinary frequency 06/02/2016  . Other fatigue 06/02/2016  . Leg cramp 06/02/2016  . Pain in right hand 05/26/2016  . Chronic bilateral low back pain 05/26/2016  . Closed fracture of body of sternum 05/26/2016  . Nail bed injury right middle finger 07/13/2014   Past Medical History:  Diagnosis Date  . Anxiety   . Arthritis   . Asthma   . Cancer ([place])    skin   . Chronic kidney disease    AA      . Claustrophobia   . Complication of anesthesia    pt states he is unable to tol. foleys,due to anatomy  . Coronary artery disease   . Headache    pt reports migraines  . Hypoglycemia    las t episode 12/24/12  . Multiple sclerosis ([place])   . 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 Seasonal allergies   . Seizures ([place])   . Sleep apnea    Pt reports "mild sleep apnea"  no cpap  . Vision abnormalities     Family History  Problem Relation Age of Onset  . Hypertension Mother   . Obesity Mother   . Diabetes Mother   . Anxiety disorder Mother   . Ataxia Neg Hx   . Chorea Neg Hx   . Dementia Neg Hx   . Mental retardation Neg Hx   . Migraines Neg Hx   . Multiple sclerosis Neg Hx   . Neurofibromatosis Neg Hx   . Neuropathy Neg Hx   . Parkinsonism Neg Hx   . Seizures Neg Hx   . Stroke Neg Hx     Past Surgical History:  Procedure Laterality Date  . BACK SURGERY    . C6 CORPECTOMY WITH BILATERAL FORAMINOTOMIES C5-6 AND C6-7, FUSION C5-7 ANTERIORLY WITH STRUT ALLOGRAFT AND LOCAL BONE GRAFT, ANTERIOR CERVICAL PLATE AND SCREWS N/A 01/20/2017   Performed by Jessy Oto, MD at [facility] Right  07/13/2014   Performed by Mcarthur Rossetti, MD at [facility]  . EXCISION AND REPAIR OF NAIL MATRIX BED RIGHT MIDDLE FINGER Right 07/13/2014   Performed by Mcarthur Rossetti, MD at [facility]  . KNEE SURGERY Left    x2  arthroscopy  . NOSE SURGERY    . POSTERIOR FUSION LUMBAR SPINE  01/10/2013   Dr Louanne Skye  . POSTERIOR LUMBAR FUSION 1 LEVEL N/A 01/10/2013   Performed by Jessy Oto, MD at [facility] History   Occupational History  . Not on file  Tobacco Use  . Smoking status: Never Smoker  . Smokeless tobacco: Never Used  Substance and Sexual Activity  . Alcohol use: No  . Drug use: No  . Sexual activity: Not on file

## 2017-04-06 NOTE — Patient Instructions (Addendum)
Plan: Avoid overhead lifting and overhead use of the arms. Do not lift greater than 5 lbs. Adjust head rest in vehicle to prevent hyperextension if rear ended. We will order an external osteostimulator to attempt to improve the healing of the fusion. Vitamin D 50,000 IU q 7 days for 4 doses then vitamin D 2,000 IU daily and Calcium Citrate 200 mg BID. Try to wear the cervica brace as often as possible and when in a car. Stop arthritis medications like motrin and meloxicam.

## 2017-04-07 ENCOUNTER — Telehealth (INDEPENDENT_AMBULATORY_CARE_PROVIDER_SITE_OTHER): Payer: Self-pay | Admitting: Specialist

## 2017-04-07 NOTE — Telephone Encounter (Signed)
Pt wanted to let Dr. Louanne Skye know that treatment he got in [place] wore off last night after his appt and that he will take Tylenol until his appt 12/28. Just FYI

## 2017-04-24 ENCOUNTER — Ambulatory Visit (INDEPENDENT_AMBULATORY_CARE_PROVIDER_SITE_OTHER): Payer: Self-pay | Admitting: Specialist

## 2017-05-01 ENCOUNTER — Other Ambulatory Visit (INDEPENDENT_AMBULATORY_CARE_PROVIDER_SITE_OTHER): Payer: Self-pay | Admitting: Specialist

## 2017-05-01 NOTE — Telephone Encounter (Signed)
Vitamin D2 refill request

## 2017-05-15 ENCOUNTER — Ambulatory Visit (INDEPENDENT_AMBULATORY_CARE_PROVIDER_SITE_OTHER): Payer: Medicare Other

## 2017-05-15 ENCOUNTER — Encounter (INDEPENDENT_AMBULATORY_CARE_PROVIDER_SITE_OTHER): Payer: Self-pay | Admitting: Specialist

## 2017-05-15 ENCOUNTER — Ambulatory Visit (INDEPENDENT_AMBULATORY_CARE_PROVIDER_SITE_OTHER): Payer: 59 | Admitting: Specialist

## 2017-05-15 VITALS — BP 133/74 | HR 70 | Ht 72.0 in | Wt 195.0 lb

## 2017-05-15 DIAGNOSIS — M1712 Unilateral primary osteoarthritis, left knee: Secondary | ICD-10-CM | POA: Diagnosis not present

## 2017-05-15 DIAGNOSIS — M25562 Pain in left knee: Secondary | ICD-10-CM

## 2017-05-15 DIAGNOSIS — M5440 Lumbago with sciatica, unspecified side: Secondary | ICD-10-CM

## 2017-05-15 DIAGNOSIS — G8929 Other chronic pain: Secondary | ICD-10-CM | POA: Diagnosis not present

## 2017-05-15 DIAGNOSIS — M542 Cervicalgia: Secondary | ICD-10-CM | POA: Diagnosis not present

## 2017-05-15 DIAGNOSIS — Z981 Arthrodesis status: Secondary | ICD-10-CM | POA: Diagnosis not present

## 2017-05-15 MED ORDER — DICLOFENAC SODIUM 3 % TD GEL: 4.0000 g | Freq: Four times a day (QID) | TRANSDERMAL | 2 refills | Status: DC | PRN
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
## 2017-05-15 NOTE — Progress Notes (Signed)
Cervical spi

## 2017-05-15 NOTE — Patient Instructions (Addendum)
Plan: Avoid frequent bending and stooping  No lifting greater than 10 lbs. May use ice or moist heat for pain. Weight loss is of benefit. Handicap license is approved.  Avoid overhead lifting and overhead use of the arms. Do not lift greater than 10 lbs. Adjust head rest in vehicle to prevent hyperextension if rear ended. Order TENS and PT from [facility]. Order an external bone osteostimulator to promote healing of the anterior cervical fusion.  Knee is suffering from osteoarthritis, only real proven treatments are Weight loss, NSIADs like diclofenac and exercise. Well padded shoes help. Ice the knee 2-3 times a day 15-20 mins at a time.

## 2017-05-18 ENCOUNTER — Telehealth (INDEPENDENT_AMBULATORY_CARE_PROVIDER_SITE_OTHER): Payer: Self-pay | Admitting: Specialist

## 2017-05-18 NOTE — Telephone Encounter (Signed)
Patient called advised the Rx for Meloxicam is not at the pharmacy yet

## 2017-05-18 NOTE — Telephone Encounter (Signed)
Patient called advised the Rx for Meloxicam is not at the pharmacy yet. The number to contact patient is [phone]

## 2017-05-26 ENCOUNTER — Telehealth (INDEPENDENT_AMBULATORY_CARE_PROVIDER_SITE_OTHER): Payer: Self-pay | Admitting: Specialist

## 2017-05-26 NOTE — Telephone Encounter (Signed)
Brittni from [facility] called asking for clarifications on his neck treatment. Wanted to know if Dr. Louanne Skye would like for him to have some manuel treatment to his neck.

## 2017-05-26 NOTE — Telephone Encounter (Signed)
Joel Galloway from [facility] called asking for clarifications on his neck treatment. Wanted to know if Dr. Louanne Skye would like for him to have some manuel treatment to his neck. CB # [phone]. Can leave detailed message also.

## 2017-05-27 ENCOUNTER — Other Ambulatory Visit (INDEPENDENT_AMBULATORY_CARE_PROVIDER_SITE_OTHER): Payer: Self-pay | Admitting: Specialist

## 2017-05-27 MED ORDER — MELOXICAM 15 MG PO TABS: 15.0000 mg | ORAL_TABLET | Freq: Every day | ORAL | 3 refills | Status: DC

## 2017-05-27 NOTE — Telephone Encounter (Signed)
The phone number he gave is not the phone number for either of the pharmacies listed in Epic for him so I printed the RX and it can be called in or he can let us know what pharmacy he wishes to use by name and we can place the  Order. jen.

## 2017-05-27 NOTE — Telephone Encounter (Signed)
Brittni needs a response by 01/14.

## 2017-05-27 NOTE — Telephone Encounter (Signed)
No manipulative treatment of the cervical spine as he is post corpectomy. Postural and isometric and ROM, may work on soft tissue triggers.

## 2017-05-28 NOTE — Telephone Encounter (Signed)
Called to pharm 

## 2017-05-28 NOTE — Telephone Encounter (Signed)
I called and lmom for Brittani advising on Dr. Otho Ket message below.

## 2017-06-03 ENCOUNTER — Other Ambulatory Visit (INDEPENDENT_AMBULATORY_CARE_PROVIDER_SITE_OTHER): Payer: Self-pay | Admitting: Specialist

## 2017-06-03 NOTE — Telephone Encounter (Signed)
I called and advised patient of this.

## 2017-06-03 NOTE — Telephone Encounter (Signed)
Vit D2

## 2017-06-03 NOTE — Telephone Encounter (Signed)
He should take over the counter vitamin D 2,000 units per day, no order for this in computer as it is over the counter medication.

## 2017-06-03 NOTE — Telephone Encounter (Signed)
Stop large dose and go to daily 2,000 units per day.

## 2017-06-03 NOTE — Telephone Encounter (Signed)
I called and advised patient he states that he understands

## 2017-06-04 ENCOUNTER — Telehealth (INDEPENDENT_AMBULATORY_CARE_PROVIDER_SITE_OTHER): Payer: Self-pay | Admitting: Physician Assistant

## 2017-06-04 ENCOUNTER — Telehealth (INDEPENDENT_AMBULATORY_CARE_PROVIDER_SITE_OTHER): Payer: Self-pay | Admitting: Specialist

## 2017-06-04 ENCOUNTER — Telehealth (INDEPENDENT_AMBULATORY_CARE_PROVIDER_SITE_OTHER): Payer: Self-pay | Admitting: Orthopaedic Surgery

## 2017-06-04 NOTE — Telephone Encounter (Signed)
Patient called to see if the order was put in for an external bone osteostimulator. Please advise # [phone]

## 2017-06-04 NOTE — Telephone Encounter (Signed)
Yes, that's fine 

## 2017-06-04 NOTE — Telephone Encounter (Signed)
error 

## 2017-06-04 NOTE — Telephone Encounter (Signed)
Patient wants a cortisone injection in his left knee and stated hes seen Dr. Ninfa Linden for that before. I didn't see anything in his chart, is this okay?

## 2017-06-05 NOTE — Telephone Encounter (Signed)
Patient called to see if the order was put in for an external bone osteostimulator----I don't see an order for this however I do see I your note that an order was going to be placed for this but there is no order.  Please let me know which one you want to use.

## 2017-06-08 ENCOUNTER — Ambulatory Visit (INDEPENDENT_AMBULATORY_CARE_PROVIDER_SITE_OTHER): Payer: 59 | Admitting: Physician Assistant

## 2017-06-08 DIAGNOSIS — M1712 Unilateral primary osteoarthritis, left knee: Secondary | ICD-10-CM

## 2017-06-08 DIAGNOSIS — M542 Cervicalgia: Secondary | ICD-10-CM

## 2017-06-08 DIAGNOSIS — M5440 Lumbago with sciatica, unspecified side: Secondary | ICD-10-CM | POA: Diagnosis not present

## 2017-06-08 MED ORDER — METHYLPREDNISOLONE ACETATE 40 MG/ML IJ SUSP
40.0000 mg | INTRAMUSCULAR | Status: AC | PRN
  Administered 2017-06-08: 40 mg via INTRA_ARTICULAR

## 2017-06-08 MED ORDER — LIDOCAINE HCL 1 % IJ SOLN
3.0000 mL | INTRAMUSCULAR | Status: AC | PRN
  Administered 2017-06-08: 3 mL

## 2017-06-08 NOTE — Progress Notes (Signed)
Office Visit Note   Patient: Joel Galloway           Date of Birth: Jul 19, 1960           MRN: [id number] Visit Date: 06/08/2017              Requested by: Joel Galloway, [place], [place] [address] PCP: Joel Roers, MD   Assessment & Plan: Visit Diagnoses:  1. Primary osteoarthritis of left knee     Plan: We will send him to physical therapy for his left knee low back and cervical spine.  This is to include home exercise program of strengthening range of motion tissue massage and modalities.  He will follow-up with Dr. Louanne Galloway as scheduled.  Follow-up with [place] on an as-needed basis.  Follow-Up Instructions: Return if symptoms worsen or fail to improve.   Orders:  Orders Placed This Encounter  Procedures  . Large Joint Inj   No orders of the defined types were placed in this encounter.     Procedures: Large Joint Inj on 06/08/2017 8:46 AM Indications: pain Details: 22 G 1.5 in needle, anterolateral approach  Arthrogram: No  Medications: 3 mL lidocaine 1 %; 40 mg methylPREDNISolone acetate 40 MG/ML Outcome: tolerated well, no immediate complications Procedure, treatment alternatives, risks and benefits explained, specific risks discussed. Consent was given by the patient. Immediately prior to procedure a time out was called to verify the correct patient, procedure, equipment, support staff and site/side marked as required. Patient was prepped and draped in the usual sterile fashion.       Clinical Data: No additional findings.   Subjective: Chief Complaint  Patient presents with  . Left Knee - Pain    HPI [name] comes in today complaining of left knee pain.  He has known osteoarthritis of the left knee.  He also asked about feeling somewhat different from his therapy for his low back and cervical spine and his knee.  He has had no injury no catching locking or giving way of the left knee.  Just increased pain.  He is taking meloxicam without any  real relief. Review of Systems  See HPI otherwise negative   Objective: Vital Signs: There were no vitals taken for this visit.  Physical Exam  Constitutional: He is oriented to person, place, and time. He appears well-developed and well-nourished. No distress.  Pulmonary/Chest: Effort normal.  Neurological: He is alert and oriented to person, place, and time.  Skin: He is not diaphoretic.  Psychiatric: He has a normal mood and affect.    Ortho Exam Left knee full extension flexion to approximately 15 degrees.  No instability valgus varus stressing.  Significant crepitus with passive range of motion.  Tenderness along medial joint line.  No effusion abnormal warmth erythema.  Port sites from previous arthroscopy are well-healed no signs of infection. Specialty Comments:  No specialty comments available.  Imaging: No results found.   PMFS History: Patient Active Problem List   Diagnosis Date Noted  . S/P cervical spinal fusion 01/20/2017  . Memory difficulties 10/14/2016  . Tachycardia 07/12/2016  . Chest pain 07/12/2016  . Polysubstance abuse ([place]) 07/12/2016  . SIRS (systemic inflammatory response syndrome) ([place]) 07/12/2016  . Lactic acidosis 07/12/2016  . Anxiety state 07/12/2016  . Abnormal brain MRI 07/08/2016  . Chronic pain syndrome 07/08/2016  . Chronic migraine w/o aura w/o status migrainosus, not intractable 07/08/2016  . History of optic neuritis 06/02/2016  . Numbness 06/02/2016  .  Gait disturbance 06/02/2016  . Urinary frequency 06/02/2016  . Other fatigue 06/02/2016  . Leg cramp 06/02/2016  . Pain in right hand 05/26/2016  . Chronic bilateral low back pain 05/26/2016  . Closed fracture of body of sternum 05/26/2016  . Nail bed injury right middle finger 07/13/2014  . Lumbar degenerative disc disease 01/12/2013    Class: Chronic  . HNP (herniated nucleus pulposus), lumbar 01/12/2013    Class: Chronic  . Spinal stenosis, lumbar region, with neurogenic  claudication 01/12/2013    Class: Chronic   Past Medical History:  Diagnosis Date  . Anxiety   . Arthritis   . Asthma   . Cancer ([place])    skin   . Chronic kidney disease    AA      . Claustrophobia   . Complication of anesthesia    pt states he is unable to tol. foleys,due to anatomy  . Coronary artery disease   . Headache    pt reports migraines  . Hypoglycemia    las t episode 12/24/12  . Multiple sclerosis ([place])   . 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 Seasonal allergies   . Seizures ([place])   . Sleep apnea    Pt reports "mild sleep apnea"  no cpap  . Vision abnormalities     Family History  Problem Relation Age of Onset  . Hypertension Mother   . Obesity Mother   . Diabetes Mother   . Anxiety disorder Mother   . Ataxia Neg Hx   . Chorea Neg Hx   . Dementia Neg Hx   . Mental retardation Neg Hx   . Migraines Neg Hx   . Multiple sclerosis Neg Hx   . Neurofibromatosis Neg Hx   . Neuropathy Neg Hx   . Parkinsonism Neg Hx   . Seizures Neg Hx   . Stroke Neg Hx     Past Surgical History:  Procedure Laterality Date  . ANTERIOR CERVICAL DECOMP/DISCECTOMY FUSION N/A 01/20/2017   Procedure: C6 CORPECTOMY WITH BILATERAL FORAMINOTOMIES C5-6 AND C6-7, FUSION C5-7 ANTERIORLY WITH STRUT ALLOGRAFT AND LOCAL BONE GRAFT, ANTERIOR CERVICAL PLATE AND SCREWS;  Surgeon: Joel Oto, MD;  Location: [facility];  Service: Orthopedics;  Laterality: N/A;  . BACK SURGERY    . HEMATOMA EVACUATION Right 07/13/2014   Procedure: EVACUATION HEMATOMA RIGHT MIDDLE FINGER;  Surgeon: Joel Rossetti, MD;  Location: [facility];  Service: Orthopedics;  Laterality: Right;  . KNEE SURGERY Left    x2  arthroscopy  . NOSE SURGERY    . POSTERIOR FUSION LUMBAR SPINE  01/10/2013   Dr Joel Galloway  . WISDOM TOOTH EXTRACTION     Social History   Occupational History  . Not on file  Tobacco Use  . Smoking status: Never Smoker  . Smokeless tobacco: Never Used  Substance and Sexual Activity  . Alcohol use:  No  . Drug use: No  . Sexual activity: Not on file

## 2017-06-12 ENCOUNTER — Encounter (INDEPENDENT_AMBULATORY_CARE_PROVIDER_SITE_OTHER): Payer: Self-pay | Admitting: Specialist

## 2017-06-19 ENCOUNTER — Ambulatory Visit (INDEPENDENT_AMBULATORY_CARE_PROVIDER_SITE_OTHER): Payer: 59 | Admitting: Specialist

## 2017-06-19 ENCOUNTER — Ambulatory Visit (INDEPENDENT_AMBULATORY_CARE_PROVIDER_SITE_OTHER): Payer: Medicare Other

## 2017-06-19 ENCOUNTER — Encounter (INDEPENDENT_AMBULATORY_CARE_PROVIDER_SITE_OTHER): Payer: Self-pay | Admitting: Specialist

## 2017-06-19 VITALS — BP 117/78 | HR 62 | Ht 72.0 in | Wt 195.0 lb

## 2017-06-19 DIAGNOSIS — M5136 Other intervertebral disc degeneration, lumbar region: Secondary | ICD-10-CM | POA: Diagnosis not present

## 2017-06-19 DIAGNOSIS — Z981 Arthrodesis status: Secondary | ICD-10-CM

## 2017-06-19 DIAGNOSIS — M4326 Fusion of spine, lumbar region: Secondary | ICD-10-CM | POA: Diagnosis not present

## 2017-06-19 NOTE — Patient Instructions (Addendum)
Plan: Avoid frequent bending and stooping  No lifting greater than 10 lbs. May use ice or moist heat for pain. Weight loss is of benefit. Handicap license is approved. Avoid bending, stooping and avoid lifting weights greater than 10 lbs. Avoid prolong standing and walking. Order for a new walker with wheels. Surgery scheduling secretary Kandice Hams, will call you in the next week to schedule for surgery.  Surgery recommended is a two level lumbar fusion L4-5 and L3-4 this would be done with rods, screws and cages with local bone graft and allograft (donor bone graft). Take hydrocodone for for pain. Risk of surgery includes risk of infection 1 in 200 patients, bleeding 1/2% chance you would need a transfusion.   Risk to the nerves is one in 10,000. You will need to use a brace for 3 months and wean from the brace on the 4th month. Expect improved walking and standing tolerance. Expect relief of leg pain but numbness may persist depending on the length and degree of pressure that has been present

## 2017-06-19 NOTE — Progress Notes (Signed)
Office Visit Note   Patient: Joel Galloway           Date of Birth: 04-11-1961           MRN: [id number] Visit Date: 06/19/2017              Requested by: Tamsen Roers, [place], [place] [address] PCP: Tamsen Roers, MD   Assessment & Plan: Visit Diagnoses:  1. Status post cervical spinal fusion   2. Degenerative disc disease, lumbar     Plan: Avoid frequent bending and stooping  No lifting greater than 10 lbs. May use ice or moist heat for pain. Weight loss is of benefit. Handicap license is approved. Avoid bending, stooping and avoid lifting weights greater than 10 lbs. Avoid prolong standing and walking. Order for a new walker with wheels. Surgery scheduling secretary Kandice Hams, will call you in the next week to schedule for surgery.  Surgery recommended is a two level lumbar fusion L4-5 and L3-4 this would be done with rods, screws and cages with local bone graft and allograft (donor bone graft). Take hydrocodone for for pain. Risk of surgery includes risk of infection 1 in 200 patients, bleeding 1/2% chance you would need a transfusion.   Risk to the nerves is one in 10,000. You will need to use a brace for 3 months and wean from the brace on the 4th month. Expect improved walking and standing tolerance. Expect relief of leg pain but numbness may persist depending on the length and degree of pressure that has been present.   Follow-Up Instructions: Return in about 4 weeks (around 07/17/2017).   Orders:  Orders Placed This Encounter  Procedures  . XR Cervical Spine 2 or 3 views   No orders of the defined types were placed in this encounter.     Procedures: No procedures performed   Clinical Data: No additional findings.   Subjective: Chief Complaint  Patient presents with  . Neck - Follow-up  . Lower Back - Follow-up    58 year old male with history of C4-5 corpectomy for stenosis and he is not having any pain in the neck. The lumbar  spine is the source of most of his discomfort, he is taking 2-3 percocet per day. He has a prescription for 3 a day with Dr. Rex Kras.     Review of Systems  Constitutional: Negative.   HENT: Negative.   Eyes: Negative.   Respiratory: Negative.   Cardiovascular: Negative.   Gastrointestinal: Negative.   Endocrine: Negative.   Genitourinary: Negative.   Musculoskeletal: Negative.   Skin: Negative.   Allergic/Immunologic: Negative.   Neurological: Negative.   Hematological: Negative.   Psychiatric/Behavioral: Negative.      Objective: Vital Signs: BP 117/78 (BP Location: Left Arm, Patient Position: Sitting)   Pulse 62   Ht 6' (1.829 m)   Wt 195 lb (88.5 kg)   BMI 26.45 kg/m   Physical Exam  Constitutional: He is oriented to person, place, and time. He appears well-developed and well-nourished.  HENT:  Head: Normocephalic and atraumatic.  Eyes: EOM are normal. Pupils are equal, round, and reactive to light.  Neck: Normal range of motion. Neck supple.  Pulmonary/Chest: Effort normal and breath sounds normal.  Abdominal: Soft. Bowel sounds are normal.  Neurological: He is alert and oriented to person, place, and time.  Skin: Skin is warm and dry.  Psychiatric: He has a normal mood and affect. His behavior is normal.  Judgment and thought content normal.    Back Exam   Tenderness  The patient is experiencing tenderness in the lumbar and cervical.  Range of Motion  Extension: abnormal  Flexion: abnormal  Lateral bend right: abnormal  Lateral bend left: abnormal  Rotation right: abnormal  Rotation left: abnormal   Muscle Strength  The patient has normal back strength. Right Quadriceps:  5/5  Left Quadriceps:  5/5  Right Hamstrings:  5/5  Left Hamstrings:  5/5   Tests  Straight leg raise right: negative Straight leg raise left: negative  Reflexes  Patellar: normal Achilles:  Hyporeflexic normal Babinski's sign: normal   Other  Toe walk: normal Heel walk:  normal Sensation: normal Gait: normal  Erythema: no back redness Scars: present      Specialty Comments:  No specialty comments available.  Imaging: No results found.   PMFS History: Patient Active Problem List   Diagnosis Date Noted  . Lumbar degenerative disc disease 01/12/2013    Priority: High    Class: Chronic  . HNP (herniated nucleus pulposus), lumbar 01/12/2013    Priority: High    Class: Chronic  . Spinal stenosis, lumbar region, with neurogenic claudication 01/12/2013    Priority: High    Class: Chronic  . S/P cervical spinal fusion 01/20/2017  . Memory difficulties 10/14/2016  . Tachycardia 07/12/2016  . Chest pain 07/12/2016  . Polysubstance abuse ([place]) 07/12/2016  . SIRS (systemic inflammatory response syndrome) ([place]) 07/12/2016  . Lactic acidosis 07/12/2016  . Anxiety state 07/12/2016  . Abnormal brain MRI 07/08/2016  . Chronic pain syndrome 07/08/2016  . Chronic migraine w/o aura w/o status migrainosus, not intractable 07/08/2016  . History of optic neuritis 06/02/2016  . Numbness 06/02/2016  . Gait disturbance 06/02/2016  . Urinary frequency 06/02/2016  . Other fatigue 06/02/2016  . Leg cramp 06/02/2016  . Pain in right hand 05/26/2016  . Chronic bilateral low back pain 05/26/2016  . Closed fracture of body of sternum 05/26/2016  . Nail bed injury right middle finger 07/13/2014   Past Medical History:  Diagnosis Date  . Anxiety   . Arthritis   . Asthma   . Cancer ([place])    skin   . Chronic kidney disease    AA      . Claustrophobia   . Complication of anesthesia    pt states he is unable to tol. foleys,due to anatomy  . Coronary artery disease   . Headache    pt reports migraines  . Hypoglycemia    las t episode 12/24/12  . Multiple sclerosis ([place])   . 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 Seasonal allergies   . Seizures ([place])   . Sleep apnea    Pt reports "mild sleep apnea"  no cpap  . Vision abnormalities     Family History    Problem Relation Age of Onset  . Hypertension Mother   . Obesity Mother   . Diabetes Mother   . Anxiety disorder Mother   . Ataxia Neg Hx   . Chorea Neg Hx   . Dementia Neg Hx   . Mental retardation Neg Hx   . Migraines Neg Hx   . Multiple sclerosis Neg Hx   . Neurofibromatosis Neg Hx   . Neuropathy Neg Hx   . Parkinsonism Neg Hx   . Seizures Neg Hx   . Stroke Neg Hx     Past Surgical History:  Procedure Laterality Date  .  ANTERIOR CERVICAL DECOMP/DISCECTOMY FUSION N/A 01/20/2017   Procedure: C6 CORPECTOMY WITH BILATERAL FORAMINOTOMIES C5-6 AND C6-7, FUSION C5-7 ANTERIORLY WITH STRUT ALLOGRAFT AND LOCAL BONE GRAFT, ANTERIOR CERVICAL PLATE AND SCREWS;  Surgeon: Jessy Oto, MD;  Location: [facility];  Service: Orthopedics;  Laterality: N/A;  . BACK SURGERY    . HEMATOMA EVACUATION Right 07/13/2014   Procedure: EVACUATION HEMATOMA RIGHT MIDDLE FINGER;  Surgeon: Mcarthur Rossetti, MD;  Location: [facility];  Service: Orthopedics;  Laterality: Right;  . KNEE SURGERY Left    x2  arthroscopy  . NOSE SURGERY    . POSTERIOR FUSION LUMBAR SPINE  01/10/2013   Dr Louanne Skye  . WISDOM TOOTH EXTRACTION     Social History   Occupational History  . Not on file  Tobacco Use  . Smoking status: Never Smoker  . Smokeless tobacco: Never Used  Substance and Sexual Activity  . Alcohol use: No  . Drug use: No  . Sexual activity: Not on file

## 2017-06-19 NOTE — Telephone Encounter (Signed)
I will write a prescription as I do not know a way to enter an order for this in Epic. I think the info on external bone stimulators for the cervical spine is in the notebook from [facility] from years ago. jen

## 2017-06-19 NOTE — Telephone Encounter (Signed)
Order faxed.

## 2017-06-23 ENCOUNTER — Telehealth (INDEPENDENT_AMBULATORY_CARE_PROVIDER_SITE_OTHER): Payer: Self-pay | Admitting: Specialist

## 2017-06-23 NOTE — Telephone Encounter (Signed)
Joel Galloway is the vendor that you sent the stimulator to, she needs the operative report from 9/4. Her CB # [phone]

## 2017-06-23 NOTE — Telephone Encounter (Signed)
I faxed the Op Note to [facility].

## 2017-07-07 ENCOUNTER — Telehealth (INDEPENDENT_AMBULATORY_CARE_PROVIDER_SITE_OTHER): Payer: Self-pay | Admitting: Specialist

## 2017-07-07 NOTE — Telephone Encounter (Signed)
Patient  Called asked for a call back concerning his neck. Patient advised his neck is hurting just as bad as before his surgery. The number to contact patient is [phone]

## 2017-07-08 ENCOUNTER — Ambulatory Visit: Payer: 59 | Admitting: Neurology

## 2017-07-08 NOTE — Telephone Encounter (Signed)
Patient  Called asked for a call back concerning his neck. Patient advised his neck is hurting just as bad as before his surgery.    ----- I called and tried to get patient in to see Jayro but he wanted to see Dr. Louanne Skye, I placed him on the cancellation list.  -----Patient wants to also go ahead an schedule surgery on his back.  He would like to get it schedule for March.  Please advise.

## 2017-07-09 ENCOUNTER — Encounter: Payer: Self-pay | Admitting: Neurology

## 2017-07-31 ENCOUNTER — Ambulatory Visit (INDEPENDENT_AMBULATORY_CARE_PROVIDER_SITE_OTHER): Payer: Self-pay

## 2017-07-31 ENCOUNTER — Ambulatory Visit (INDEPENDENT_AMBULATORY_CARE_PROVIDER_SITE_OTHER): Payer: 59 | Admitting: Specialist

## 2017-07-31 ENCOUNTER — Telehealth (INDEPENDENT_AMBULATORY_CARE_PROVIDER_SITE_OTHER): Payer: Self-pay | Admitting: Specialist

## 2017-07-31 ENCOUNTER — Encounter (INDEPENDENT_AMBULATORY_CARE_PROVIDER_SITE_OTHER): Payer: Self-pay | Admitting: Specialist

## 2017-07-31 VITALS — BP 122/72 | HR 62 | Ht 72.0 in | Wt 195.0 lb

## 2017-07-31 DIAGNOSIS — M5136 Other intervertebral disc degeneration, lumbar region: Secondary | ICD-10-CM

## 2017-07-31 DIAGNOSIS — Z981 Arthrodesis status: Secondary | ICD-10-CM

## 2017-07-31 NOTE — Telephone Encounter (Signed)
Noted. Joel Galloway

## 2017-07-31 NOTE — Telephone Encounter (Signed)
Patient called stating that after having his physical therapy session, his back pain is now a 10+.  He wanted to let doctor Nitka know.  Thank you.

## 2017-07-31 NOTE — Patient Instructions (Signed)
Avoid frequent bending and stooping  No lifting greater than 10 lbs. May use ice or moist heat for pain. Weight loss is of benefit. Handicap license is approved.   
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
## 2017-08-07 NOTE — Telephone Encounter (Signed)
Pt called he hasn't received a phone call from referral to anesthesiology.    Referral  Post cervical spinal fusion  Degenerative disc disease lumbar

## 2017-08-07 NOTE — Telephone Encounter (Signed)
I called and advised patient that referral was put in on Friday, and sent to Dr. Hardin Negus office for review on Monday, so it may take a little while for them to get those notes reviewed and to get a call to sched .  Pt states that he understands.

## 2017-08-14 NOTE — Telephone Encounter (Signed)
Patient said he still has not received a call and wants to know if we can do anything to help him get scheduled. He has tried calling the office and can not get through or get a call from anyone.

## 2017-08-16 ENCOUNTER — Other Ambulatory Visit (INDEPENDENT_AMBULATORY_CARE_PROVIDER_SITE_OTHER): Payer: Self-pay | Admitting: Specialist

## 2017-08-17 ENCOUNTER — Telehealth (INDEPENDENT_AMBULATORY_CARE_PROVIDER_SITE_OTHER): Payer: Self-pay | Admitting: Specialist

## 2017-08-17 NOTE — Telephone Encounter (Signed)
meloxicam refill request 

## 2017-08-17 NOTE — Telephone Encounter (Signed)
Patient called this morning stating that he has been experiencing a "shocking" feeling in his neck for a couple of weeks and can not deal with it any longer.  He states that the pain is worse than is was before surgery.  CB#[phone].  Thank you.

## 2017-08-18 NOTE — Telephone Encounter (Signed)
I gave him the number [phone] and advised that he can ask for the office manager and see if they can get him an answer since no one else will.

## 2017-08-18 NOTE — Telephone Encounter (Signed)
I called and spoke with patient, he states that he is having an electrical shoch feeling from his fingers up to his shoulder into his neck, he states that this has been going on for about 2 weeks and its getting worse that his pain is worse than it was before surgery.--Please advise (I gave him the phone number to Dr. Nicholaus Bloom to call them and check the status of his [phone])

## 2017-08-18 NOTE — Telephone Encounter (Signed)
See if he will come in for an evaluation tomorrow, Wednsday. jen

## 2017-08-19 NOTE — Telephone Encounter (Signed)
Worked patient in on 08/20/17 @ 1015, pt IS aware that this is a working appt and that he may have to wait.

## 2017-08-20 ENCOUNTER — Ambulatory Visit (INDEPENDENT_AMBULATORY_CARE_PROVIDER_SITE_OTHER): Payer: 59 | Admitting: Specialist

## 2017-08-20 ENCOUNTER — Ambulatory Visit (INDEPENDENT_AMBULATORY_CARE_PROVIDER_SITE_OTHER): Payer: Medicare Other

## 2017-08-20 ENCOUNTER — Encounter (INDEPENDENT_AMBULATORY_CARE_PROVIDER_SITE_OTHER): Payer: Self-pay | Admitting: Specialist

## 2017-08-20 VITALS — BP 113/68 | HR 58 | Ht 72.0 in | Wt 195.0 lb

## 2017-08-20 DIAGNOSIS — M5412 Radiculopathy, cervical region: Secondary | ICD-10-CM

## 2017-08-20 DIAGNOSIS — M960 Pseudarthrosis after fusion or arthrodesis: Secondary | ICD-10-CM

## 2017-08-20 DIAGNOSIS — M542 Cervicalgia: Secondary | ICD-10-CM | POA: Diagnosis not present

## 2017-08-20 DIAGNOSIS — M5030 Other cervical disc degeneration, unspecified cervical region: Secondary | ICD-10-CM | POA: Diagnosis not present

## 2017-08-20 NOTE — Progress Notes (Signed)
Office Visit Note   Patient: Joel Galloway           Date of Birth: 12-13-1960           MRN: [id number] Visit Date: 08/20/2017              Requested by: [name], MD [address], [place] [address] PCP: [name], MD   Assessment & Plan: Visit Diagnoses:  1. Cervicalgia   2. Cervical radiculopathy at C6     Plan: Avoid overhead lifting and overhead use of the arms. Do not lift greater than 5 lbs. Adjust head rest in vehicle to prevent hyperextension if rear ended. Take extra precautions to avoid falling.  Myelogram and post myelogram CT Scan on the lumbar and Cervical spine ordered  Follow-Up Instructions: Return in about 2 weeks (around 09/03/2017).   Orders:  Orders Placed This Encounter  Procedures  . XR Cervical Spine 2 or 3 views   No orders of the defined types were placed in this encounter.     Procedures: No procedures performed   Clinical Data: No additional findings.   Subjective: Chief Complaint  Patient presents with  . Neck - Pain    57 year old male with history of cervical corpectomy C6 with fusion C5-C7 in 01/2017 he is having recurrent pain that is radicular quality into the left periscapular and left arm with radiation into the left hand C6. He is demonstrating persistent movement at the fusion site. Unfortunately he fell about 2 weeks ago he relates that it may have been asthma related and his wife found him on the floor. Since then he has had increasing left arm symptoms along with sharp pain into the left face. No bowel or bladder difficulty. He is ambulating  Several miles per day. Presently taking tylenol and motrin for pain.    Review of Systems  Constitutional: Positive for unexpected weight change.  HENT: Positive for congestion, sinus pressure and sneezing. Negative for rhinorrhea and sinus pain.   Eyes: Negative.   Respiratory: Positive for cough and wheezing.   Cardiovascular: Negative.     Gastrointestinal: Negative.  Negative for abdominal distention, abdominal pain, anal bleeding, blood in stool and rectal pain.  Endocrine: Negative.  Negative for cold intolerance and heat intolerance.  Genitourinary: Negative.  Negative for difficulty urinating, dysuria, enuresis and flank pain.  Musculoskeletal: Positive for neck pain and neck stiffness. Negative for arthralgias, back pain and gait problem.  Skin: Negative.   Allergic/Immunologic: Negative.   Neurological: Positive for numbness. Negative for tremors and weakness.  Hematological: Negative.   Psychiatric/Behavioral: Negative.  Negative for agitation, behavioral problems, confusion, decreased concentration, dysphoric mood, hallucinations, self-injury and sleep disturbance. The patient is not nervous/anxious and is not hyperactive.      Objective: Vital Signs: BP 113/68 (BP Location: Left Arm, Patient Position: Sitting)   Pulse (!) 58   Ht 6' (1.829 m)   Wt 195 lb (88.5 kg)   BMI 26.45 kg/m   Physical Exam  Back Exam   Tenderness  The patient is experiencing tenderness in the cervical.  Range of Motion  Extension:  70 normal  Flexion: 80  Lateral bend right:  60 abnormal  Lateral bend left:  60 abnormal  Rotation right:  60 abnormal  Rotation left:  60 abnormal   Muscle Strength  Right Quadriceps:  5/5  Left Quadriceps:  5/5  Right Hamstrings:  5/5  Left Hamstrings:  5/5  Reflexes  Biceps: 0/4 Babinski's sign: normal   Comments:  No focal weakness,  Negative Hoffman sign. No clonus, no spasticity.      Specialty Comments:  No specialty comments available.  Imaging: No results found.   PMFS History: Patient Active Problem List   Diagnosis Date Noted  . Lumbar degenerative disc disease 01/12/2013    Priority: High    Class: Chronic  . HNP (herniated nucleus pulposus), lumbar 01/12/2013    Priority: High    Class: Chronic  . Spinal stenosis, lumbar region, with neurogenic claudication  01/12/2013    Priority: High    Class: Chronic  . S/P cervical spinal fusion 01/20/2017  . Memory difficulties 10/14/2016  . Tachycardia 07/12/2016  . Chest pain 07/12/2016  . Polysubstance abuse ([place]) 07/12/2016  . SIRS (systemic inflammatory response syndrome) ([place]) 07/12/2016  . Lactic acidosis 07/12/2016  . Anxiety state 07/12/2016  . Abnormal brain MRI 07/08/2016  . Chronic pain syndrome 07/08/2016  . Chronic migraine w/o aura w/o status migrainosus, not intractable 07/08/2016  . History of optic neuritis 06/02/2016  . Numbness 06/02/2016  . Gait disturbance 06/02/2016  . Urinary frequency 06/02/2016  . Other fatigue 06/02/2016  . Leg cramp 06/02/2016  . Pain in right hand 05/26/2016  . Chronic bilateral low back pain 05/26/2016  . Closed fracture of body of sternum 05/26/2016  . Nail bed injury right middle finger 07/13/2014   Past Medical History:  Diagnosis Date  . Anxiety   . Arthritis   . Asthma   . Cancer ([place])    skin   . Chronic kidney disease    AA      . Claustrophobia   . Complication of anesthesia    pt states he is unable to tol. foleys,due to anatomy  . Coronary artery disease   . Headache    pt reports migraines  . Hypoglycemia    las t episode 12/24/12  . Multiple sclerosis ([place])   . 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 Seasonal allergies   . Seizures ([facility])   . Sleep apnea    Pt reports "mild sleep apnea"  no cpap  . Vision abnormalities     Family History  Problem Relation Age of Onset  . Hypertension Mother   . Obesity Mother   . Diabetes Mother   . Anxiety disorder Mother   . Ataxia Neg Hx   . Chorea Neg Hx   . Dementia Neg Hx   . Mental retardation Neg Hx   . Migraines Neg Hx   . Multiple sclerosis Neg Hx   . Neurofibromatosis Neg Hx   . Neuropathy Neg Hx   . Parkinsonism Neg Hx   . Seizures Neg Hx   . Stroke Neg Hx     Past Surgical History:  Procedure Laterality Date  . ANTERIOR CERVICAL DECOMP/DISCECTOMY FUSION N/A  01/20/2017   Procedure: C6 CORPECTOMY WITH BILATERAL FORAMINOTOMIES C5-6 AND C6-7, FUSION C5-7 ANTERIORLY WITH STRUT ALLOGRAFT AND LOCAL BONE GRAFT, ANTERIOR CERVICAL PLATE AND SCREWS;  Surgeon: Jessy Oto, MD;  Location: [facility];  Service: Orthopedics;  Laterality: N/A;  . BACK SURGERY    . HEMATOMA EVACUATION Right 07/13/2014   Procedure: EVACUATION HEMATOMA RIGHT MIDDLE FINGER;  Surgeon: Mcarthur Rossetti, MD;  Location: [facility];  Service: Orthopedics;  Laterality: Right;  . KNEE SURGERY Left    x2  arthroscopy  . NOSE SURGERY    . POSTERIOR FUSION LUMBAR SPINE  01/10/2013   Dr Louanne Skye  . WISDOM TOOTH EXTRACTION     Social History   Occupational History  . Not on file  Tobacco Use  . Smoking status: Never Smoker  . Smokeless tobacco: Never Used  Substance and Sexual Activity  . Alcohol use: No  . Drug use: No  . Sexual activity: Not on file

## 2017-08-20 NOTE — Patient Instructions (Signed)
Avoid overhead lifting and overhead use of the arms. Do not lift greater than 5 lbs. Adjust head rest in vehicle to prevent hyperextension if rear ended. Take extra precautions to avoid falling.  Myelogram and post myelogram CT Scan on the lumbar and Cervical spine ordered

## 2017-09-08 ENCOUNTER — Ambulatory Visit
Admission: RE | Admit: 2017-09-08 | Discharge: 2017-09-08 | Disposition: A | Discharge status: Home / Self Care | Payer: 59 | Source: Ambulatory Visit | Attending: Specialist | Admitting: Specialist

## 2017-09-08 VITALS — BP 118/58 | HR 73

## 2017-09-08 DIAGNOSIS — M5412 Radiculopathy, cervical region: Secondary | ICD-10-CM

## 2017-09-08 DIAGNOSIS — M5030 Other cervical disc degeneration, unspecified cervical region: Secondary | ICD-10-CM

## 2017-09-08 DIAGNOSIS — M542 Cervicalgia: Secondary | ICD-10-CM

## 2017-09-08 DIAGNOSIS — Z981 Arthrodesis status: Secondary | ICD-10-CM

## 2017-09-08 MED ORDER — DIAZEPAM 5 MG PO TABS: 10.0000 mg | ORAL_TABLET | Freq: Once | ORAL | Status: DC

## 2017-09-08 MED ORDER — IOPAMIDOL (ISOVUE-M 300) INJECTION 61%
10.0000 mL | Freq: Once | INTRAMUSCULAR | Status: AC | PRN
  Administered 2017-09-08: 10 mL via INTRATHECAL

## 2017-09-08 NOTE — Discharge Instructions (Signed)

## 2017-09-10 ENCOUNTER — Ambulatory Visit (INDEPENDENT_AMBULATORY_CARE_PROVIDER_SITE_OTHER): Payer: 59 | Admitting: Specialist

## 2017-09-10 ENCOUNTER — Encounter (INDEPENDENT_AMBULATORY_CARE_PROVIDER_SITE_OTHER): Payer: Self-pay | Admitting: Specialist

## 2017-09-10 VITALS — BP 132/86 | HR 75 | Ht 72.0 in | Wt 195.0 lb

## 2017-09-10 DIAGNOSIS — M48062 Spinal stenosis, lumbar region with neurogenic claudication: Secondary | ICD-10-CM | POA: Diagnosis not present

## 2017-09-10 DIAGNOSIS — M1712 Unilateral primary osteoarthritis, left knee: Secondary | ICD-10-CM | POA: Diagnosis not present

## 2017-09-10 DIAGNOSIS — M960 Pseudarthrosis after fusion or arthrodesis: Secondary | ICD-10-CM

## 2017-09-10 DIAGNOSIS — T84498A Other mechanical complication of other internal orthopedic devices, implants and grafts, initial encounter: Secondary | ICD-10-CM

## 2017-09-10 DIAGNOSIS — M4722 Other spondylosis with radiculopathy, cervical region: Secondary | ICD-10-CM

## 2017-09-10 NOTE — Patient Instructions (Addendum)
Avoid overhead lifting and overhead use of the arms. Do not lift greater than 5 lbs. Adjust head rest in vehicle to prevent hyperextension if rear ended. Take extra precautions to avoid falling, including use of a cane if you feel weak. Scheduling secretary Kandice Hams. will call you to arrange for surgery for your cervical spine. If you wish a second opinion please let us know and we can arrange for you. If you have worsening arm or leg numbness or weakness please call or go to an ER. We will contact your cardiologist and primary care physicians to seek clearance for your surgery. Surgery will be an posterior cervical fusion at the C5-6 and C6-7 level with decompression of the cervical spinal canal with right C6-7 C5-6 foramenotomy  removal of bone pressing into the left C7 foramen.. An additional decompression at the left  C6-7 level fusion will include with bone grafting and rods and lateral mass screws, Left iliac crest bone graft .  Risks of surgery include risks of infection, bleeding and risks to the nerve root and  . Surgery is indicated due to left upper extremity radiculopathy,loosening of hardware and failure of healing of the fusion at C5-C7. In the future surgery at adjacent levels may be necessary but these levels do not appear to be related to your current symptoms or signs.   Knee is suffering from osteoarthritis, only real proven treatments are Weight loss, NSIADs like meloxicam and exercise. Well padded shoes help. Ice the knee 2-3 times a day 15-20 mins at a time.

## 2017-09-10 NOTE — Progress Notes (Signed)
Office Visit Note   Patient: Joel Galloway           Date of Birth: 26-Sep-1960           MRN: [id number] Visit Date: 09/10/2017              Requested by: Tamsen Roers, [address], [place] [address] PCP: [name], MD   Assessment & Plan: Visit Diagnoses:  1. Pseudarthrosis after fusion or arthrodesis   2. Loosening of hardware in spine (HCC)   3. Other spondylosis with radiculopathy, cervical region   4. Spinal stenosis of lumbar region with neurogenic claudication   5. Unilateral primary osteoarthritis, left knee     Plan: Avoid overhead lifting and overhead use of the arms. Do not lift greater than 5 lbs. Adjust head rest in vehicle to prevent hyperextension if rear ended. Take extra precautions to avoid falling, including use of a cane if you feel weak. Scheduling secretary Kandice Hams. will call you to arrange for surgery for your cervical spine. If you wish a second opinion please let us know and we can arrange for you. If you have worsening arm or leg numbness or weakness please call or go to an ER. We will contact your cardiologist and primary care physicians to seek clearance for your surgery. Surgery will be an posterior cervical fusion at the C5-6 and C6-7 level with decompression of the cervical spinal canal with right C6-7 C5-6 foramenotomy  removal of bone pressing into the left C7 foramen.. An additional decompression at the left  C6-7 level fusion will include with bone grafting and rods and lateral mass screws, Left iliac crest bone graft .  Risks of surgery include risks of infection, bleeding and risks to the nerve root and  . Surgery is indicated due to left upper extremity radiculopathy,loosening of hardware and failure of healing of the fusion at C5-C7. In the future surgery at adjacent levels may be necessary but these levels do not appear to be related to your current symptoms or signs.  Knee is suffering from osteoarthritis, only  real proven treatments are Weight loss, NSIADs like meloxicam and exercise. Well padded shoes help. Ice the knee 2-3 times a day 15-20 mins at a time.   Follow-Up Instructions: Return in about 1 month (around 10/08/2017).   Orders:  Orders Placed This Encounter  Procedures  . Large Joint Inj: L knee   No orders of the defined types were placed in this encounter.     Procedures: Large Joint Inj: L knee on 09/10/2017 11:19 AM Indications: pain Details: 25 G 1.5 in needle, anterolateral approach  Arthrogram: No  Medications: 40 mg methylPREDNISolone acetate 40 MG/ML; 4 mL bupivacaine 0.25 % Outcome: tolerated well, no immediate complications  bandaid applied  Procedure, treatment alternatives, risks and benefits explained, specific risks discussed. Consent was given by the patient. Immediately prior to procedure a time out was called to verify the correct patient, procedure, equipment, support staff and site/side marked as required. Patient was prepped and draped in the usual sterile fashion.       Clinical Data: No additional findings.   Subjective: Chief Complaint  Patient presents with  . Neck - Follow-up  . Lower Back - Pain  . Left Knee - Pain    57 year old male with history of C6 corpectomy and fusion C5 to C7 01/20/2017 with plate and screws. He did well for a while then had episodes of  lower back pain and more recently increasing neck pain and radiation into the left shoulder. The most recent myelogram and post myelogram CT demonstrates that the Fusion is not healed and he is loosening the screws at the C7 level.     Review of Systems  Constitutional: Negative.   HENT: Negative.   Eyes: Negative.   Respiratory: Negative.   Cardiovascular: Negative.   Gastrointestinal: Negative.   Endocrine: Negative.   Genitourinary: Negative.   Musculoskeletal: Negative.   Skin: Negative.   Allergic/Immunologic: Negative.   Neurological: Negative.   Hematological:  Negative.   Psychiatric/Behavioral: Negative.      Objective: Vital Signs: BP 132/86   Pulse 75   Ht 6' (1.829 m)   Wt 195 lb (88.5 kg)   BMI 26.45 kg/m   Physical Exam  Constitutional: He is oriented to person, place, and time. He appears well-developed and well-nourished.  HENT:  Head: Normocephalic and atraumatic.  Eyes: Pupils are equal, round, and reactive to light. EOM are normal.  Neck: Normal range of motion. Neck supple.  Pulmonary/Chest: Effort normal and breath sounds normal.  Abdominal: Soft. Bowel sounds are normal.  Neurological: He is alert and oriented to person, place, and time.  Skin: Skin is warm and dry.  Psychiatric: He has a normal mood and affect. His behavior is normal. Judgment and thought content normal.    Back Exam   Tenderness  The patient is experiencing tenderness in the cervical.  Range of Motion  Extension: abnormal  Flexion: abnormal  Lateral bend right: abnormal  Lateral bend left: abnormal  Rotation right: abnormal  Rotation left: abnormal   Muscle Strength  The patient has normal back strength. Right Quadriceps:  5/5  Left Quadriceps:  5/5  Right Hamstrings:  5/5  Left Hamstrings:  5/5   Tests  Straight leg raise right: negative Straight leg raise left: negative  Reflexes  Patellar: 2/4 Achilles: 2/4 Babinski's sign: normal   Other  Toe walk: normal Heel walk: normal Sensation: normal Gait: normal  Erythema: no back redness Scars: absent  Comments:  Left finger tip numbness.      Specialty Comments:  No specialty comments available.  Imaging: No results found.   PMFS History: Patient Active Problem List   Diagnosis Date Noted  . Lumbar degenerative disc disease 01/12/2013    Priority: High    Class: Chronic  . HNP (herniated nucleus pulposus), lumbar 01/12/2013    Priority: High    Class: Chronic  . Spinal stenosis, lumbar region, with neurogenic claudication 01/12/2013    Priority: High     Class: Chronic  . S/P cervical spinal fusion 01/20/2017  . Memory difficulties 10/14/2016  . Tachycardia 07/12/2016  . Chest pain 07/12/2016  . Polysubstance abuse ([facility]) 07/12/2016  . SIRS (systemic inflammatory response syndrome) ([facility]) 07/12/2016  . Lactic acidosis 07/12/2016  . Anxiety state 07/12/2016  . Abnormal brain MRI 07/08/2016  . Chronic pain syndrome 07/08/2016  . Chronic migraine w/o aura w/o status migrainosus, not intractable 07/08/2016  . History of optic neuritis 06/02/2016  . Numbness 06/02/2016  . Gait disturbance 06/02/2016  . Urinary frequency 06/02/2016  . Other fatigue 06/02/2016  . Leg cramp 06/02/2016  . Pain in right hand 05/26/2016  . Chronic bilateral low back pain 05/26/2016  . Closed fracture of body of sternum 05/26/2016  . Nail bed injury right middle finger 07/13/2014   Past Medical History:  Diagnosis Date  . Anxiety   . Arthritis   . Asthma   .  Cancer ([facility])    skin   . Chronic kidney disease    AA      . Claustrophobia   . Complication of anesthesia    pt states he is unable to tol. foleys,due to anatomy  . Coronary artery disease   . Headache    pt reports migraines  . Hypoglycemia    las t episode 12/24/12  . Multiple sclerosis ([facility])   . 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 Seasonal allergies   . Seizures ([facility])   . Sleep apnea    Pt reports "mild sleep apnea"  no cpap  . Vision abnormalities     Family History  Problem Relation Age of Onset  . Hypertension Mother   . Obesity Mother   . Diabetes Mother   . Anxiety disorder Mother   . Ataxia Neg Hx   . Chorea Neg Hx   . Dementia Neg Hx   . Mental retardation Neg Hx   . Migraines Neg Hx   . Multiple sclerosis Neg Hx   . Neurofibromatosis Neg Hx   . Neuropathy Neg Hx   . Parkinsonism Neg Hx   . Seizures Neg Hx   . Stroke Neg Hx     Past Surgical History:  Procedure Laterality Date  . ANTERIOR CERVICAL DECOMP/DISCECTOMY FUSION N/A 01/20/2017   Procedure: C6 CORPECTOMY  WITH BILATERAL FORAMINOTOMIES C5-6 AND C6-7, FUSION C5-7 ANTERIORLY WITH STRUT ALLOGRAFT AND LOCAL BONE GRAFT, ANTERIOR CERVICAL PLATE AND SCREWS;  Surgeon: Jessy Oto, MD;  Location: [facility];  Service: Orthopedics;  Laterality: N/A;  . BACK SURGERY    . HEMATOMA EVACUATION Right 07/13/2014   Procedure: EVACUATION HEMATOMA RIGHT MIDDLE FINGER;  Surgeon: Mcarthur Rossetti, MD;  Location: [facility];  Service: Orthopedics;  Laterality: Right;  . KNEE SURGERY Left    x2  arthroscopy  . NOSE SURGERY    . POSTERIOR FUSION LUMBAR SPINE  01/10/2013   Dr Louanne Skye  . WISDOM TOOTH EXTRACTION     Social History   Occupational History  . Not on file  Tobacco Use  . Smoking status: Never Smoker  . Smokeless tobacco: Never Used  Substance and Sexual Activity  . Alcohol use: No  . Drug use: No  . Sexual activity: Not on file

## 2017-09-13 ENCOUNTER — Encounter (INDEPENDENT_AMBULATORY_CARE_PROVIDER_SITE_OTHER): Payer: Self-pay | Admitting: Specialist

## 2017-09-14 MED ORDER — METHYLPREDNISOLONE ACETATE 40 MG/ML IJ SUSP
40.0000 mg | INTRAMUSCULAR | Status: AC | PRN
  Administered 2017-09-10: 40 mg via INTRA_ARTICULAR

## 2017-09-14 MED ORDER — BUPIVACAINE HCL 0.25 % IJ SOLN
4.0000 mL | INTRAMUSCULAR | Status: AC | PRN
  Administered 2017-09-10: 4 mL via INTRA_ARTICULAR

## 2017-09-15 ENCOUNTER — Encounter (INDEPENDENT_AMBULATORY_CARE_PROVIDER_SITE_OTHER): Payer: Self-pay | Admitting: Radiology

## 2017-10-01 ENCOUNTER — Encounter (INDEPENDENT_AMBULATORY_CARE_PROVIDER_SITE_OTHER): Payer: Self-pay | Admitting: Surgery

## 2017-10-01 ENCOUNTER — Ambulatory Visit (INDEPENDENT_AMBULATORY_CARE_PROVIDER_SITE_OTHER): Payer: 59 | Admitting: Surgery

## 2017-10-01 VITALS — BP 129/84 | HR 67 | Resp 16 | Ht 72.0 in | Wt 187.0 lb

## 2017-10-01 DIAGNOSIS — M5412 Radiculopathy, cervical region: Secondary | ICD-10-CM

## 2017-10-01 NOTE — Progress Notes (Signed)
57 year old white male with history of cervical stenosis, neck pain and left upper extremity radiculopathy presented today for preop history and physical.  Today full H&P was placed in patient's chart.  Symptoms have not changed.

## 2017-10-01 NOTE — H&P (Addendum)
Joel Galloway is an 57 y.o. male.   Chief Complaint: Neck pain and left upper extremity radiculopathy HPI: Patient history of cervical stenosis and the above complaint presents for preoperative history and physical today.  He has failed conservative treatment.  Past Medical History:  Diagnosis Date  . Anxiety   . Arthritis   . Asthma   . Cancer ([facility])    skin   . Chronic kidney disease    AA      . Claustrophobia   . Complication of anesthesia    pt states he is unable to tol. foleys,due to anatomy  . Coronary artery disease   . Headache    pt reports migraines  . Hypoglycemia    las t episode 12/24/12  . Multiple sclerosis ([facility])   . 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 Seasonal allergies   . Seizures ([facility])   . Sleep apnea    Pt reports "mild sleep apnea"  no cpap  . Vision abnormalities     Past Surgical History:  Procedure Laterality Date  . ANTERIOR CERVICAL DECOMP/DISCECTOMY FUSION N/A 01/20/2017   Procedure: C6 CORPECTOMY WITH BILATERAL FORAMINOTOMIES C5-6 AND C6-7, FUSION C5-7 ANTERIORLY WITH STRUT ALLOGRAFT AND LOCAL BONE GRAFT, ANTERIOR CERVICAL PLATE AND SCREWS;  Surgeon: Jessy Oto, MD;  Location: [facility];  Service: Orthopedics;  Laterality: N/A;  . BACK SURGERY    . HEMATOMA EVACUATION Right 07/13/2014   Procedure: EVACUATION HEMATOMA RIGHT MIDDLE FINGER;  Surgeon: Mcarthur Rossetti, MD;  Location: [facility];  Service: Orthopedics;  Laterality: Right;  . KNEE SURGERY Left    x2  arthroscopy  . NOSE SURGERY    . POSTERIOR FUSION LUMBAR SPINE  01/10/2013   Dr Louanne Skye  . WISDOM TOOTH EXTRACTION      Family History  Problem Relation Age of Onset  . Hypertension Mother   . Obesity Mother   . Diabetes Mother   . Anxiety disorder Mother   . Ataxia Neg Hx   . Chorea Neg Hx   . Dementia Neg Hx   . Mental retardation Neg Hx   . Migraines Neg Hx   . Multiple sclerosis Neg Hx   . Neurofibromatosis Neg Hx   . Neuropathy Neg Hx   . Parkinsonism Neg Hx   .  Seizures Neg Hx   . Stroke Neg Hx    Social History:  reports that he has never smoked. He has never used smokeless tobacco. He reports that he does not drink alcohol or use drugs.  Allergies:  Allergies  Allergen Reactions  . Antihistamines, Chlorpheniramine-Type Other (See Comments)    Chest tightness  . Lyrica [Pregabalin] Palpitations  . Other Anaphylaxis    nuts  . Neosporin [Neomycin-Bacitracin Zn-Polymyx] Swelling  . Aspirin Other (See Comments)    High doses causes toxicity   . Benadryl [Diphenhydramine] Other (See Comments)    Chest tightness  . Benztropine Other (See Comments)    Unknown  . Tape     rash  . Gabapentin Palpitations  . Propranolol Nausea Only    Chest tightness    No medications prior to admission.    No results found for this or any previous visit (from the past 48 hour(s)). No results found.  Review of Systems  Constitutional: Negative.   HENT: Negative.   Eyes: Negative.   Respiratory: Negative.   Cardiovascular: Negative.   Gastrointestinal: Negative.   Genitourinary: Negative.   Musculoskeletal: Positive for back  pain and neck pain.  Skin: Negative.   Neurological: Positive for tingling.    There were no vitals taken for this visit. Physical Exam  Constitutional: He is oriented to person, place, and time. He appears well-developed and well-nourished. No distress.  HENT:  Head: Normocephalic and atraumatic.  Eyes: Pupils are equal, round, and reactive to light. EOM are normal.  Neck:  Decreased cervical spine range of motion.  Mild brachial plexus tenderness.  Positive Spurling test.  Cardiovascular: Normal rate and normal heart sounds.  Respiratory: Effort normal and breath sounds normal. No respiratory distress.  GI: Soft. He exhibits no distension. There is no tenderness.  Musculoskeletal: Normal range of motion.  Neurological: He is alert and oriented to person, place, and time.  Skin: Skin is warm and dry.  Psychiatric: He  has a normal mood and affect.     Assessment/Plan Cervical stenosis,left upper extremity radiculopathy,loosening of hardware and failure of healing of the fusion at C5-C7.  We will proceed with POSTERIOR CERVICAL FUSION C5-6 AND C6-7 WITH POSTERIOR LATERAL MASS SCREWS AND RODS C5-7, POSTERIOR LEFT C6-7 FORAMINOTOMY, POSTERIOR LEFT ILIAC CREST BONE GRAFT HARVEST as scheduled.  Surgical procedure along with possible recovery time discussed.  All questions answered.  Benjiman Core, PA-C 10/01/2017, 10:05 AM

## 2017-10-05 NOTE — Pre-Procedure Instructions (Signed)
Joel Galloway  10/05/2017      [facility] [phone] - [place] AT [place] [name] Phone: [phone] Fax: [phone]    Your procedure is scheduled on 10/09/2017.  Report to [name] at 1050 A.M.  Call this number if you have problems the morning of surgery:  [phone]   Remember:  Nothing to eat or drink after midnight the night before your surgery.   Continue all medications as directed by your physician except follow these medication instructions before surgery below     Take these medicines the morning of surgery with A SIP OF WATER: Acetaminophen (Tylenol) - if needed Albuterol inhaler - if needed Bisoprolol (Zebeta) Clonazepam (Klonopin) Metoprolol Tartrate (Lopressor) Omeprazole (Prilosec) Oxycodone-acetaminophen (Percocet) - if needed Eye drops - if needed  7 days prior to surgery STOP taking any Meloxicam (Mobic), Diclofenac Gel, Aspirin (unless otherwise instructed by your surgeon), Aleve, Naproxen, Ibuprofen, Motrin, Advil, Goody's, BC's, all herbal medications, fish oil, and all vitamins      Do not wear jewelry, make-up or nail polish.  Do not wear lotions, powders, or colognes, or deodorant.  Men may shave face and neck.  Do not bring valuables to the hospital.  [name] is not responsible for any belongings or valuables.  Hearing aids, eyeglasses, contacts, dentures or bridgework may not be worn into surgery.  Leave your suitcase in the car.  After surgery it may be brought to your room.  For patients admitted to the hospital, discharge time will be determined by your treatment team.  Patients discharged the day of surgery will not be allowed to drive home.   Name and phone number of your driver:    Special instructions:   [facility]- Preparing For Surgery  Before surgery, you can play an important role. Because skin is not sterile, your  skin needs to be as free of germs as possible. You can reduce the number of germs on your skin by washing with CHG (chlorahexidine gluconate) Soap before surgery.  CHG is an antiseptic cleaner which kills germs and bonds with the skin to continue killing germs even after washing.    Oral Hygiene is also important to reduce your risk of infection.  Remember - BRUSH YOUR TEETH THE MORNING OF SURGERY WITH YOUR TOOTHPASTE  Please do not use if you have an allergy to CHG or antibacterial soaps. If your skin becomes reddened/irritated stop using the CHG.  Do not shave (including legs and underarms) for at least 48 hours prior to first CHG shower. It is OK to shave your face.  Please follow these instructions carefully.   1. Shower the NIGHT BEFORE SURGERY and the MORNING OF SURGERY with CHG.   2. If you chose to wash your hair, wash your hair first as usual with your normal shampoo.  3. After you shampoo, rinse your hair and body thoroughly to remove the shampoo.  4. Use CHG as you would any other liquid soap. You can apply CHG directly to the skin and wash gently with a scrungie or a clean washcloth.   5. Apply the CHG Soap to your body ONLY FROM THE NECK DOWN.  Do not use on open wounds or open sores. Avoid contact with your eyes, ears, mouth and genitals (private parts). Wash Face and genitals (private parts)  with your normal soap.  6. Wash thoroughly,  paying special attention to the area where your surgery will be performed.  7. Thoroughly rinse your body with warm water from the neck down.  8. DO NOT shower/wash with your normal soap after using and rinsing off the CHG Soap.  9. Pat yourself dry with a CLEAN TOWEL.  10. Wear CLEAN PAJAMAS to bed the night before surgery, wear comfortable clothes the morning of surgery  11. Place CLEAN SHEETS on your bed the night of your first shower and DO NOT SLEEP WITH PETS.    Day of Surgery: Shower as stated above. Do not apply any  deodorants/lotions.  Please wear clean clothes to the hospital/surgery center.   Remember to brush your teeth WITH YOUR TOOTHPASTE    Please read over the following fact sheets that you were given.

## 2017-10-06 ENCOUNTER — Other Ambulatory Visit: Payer: Self-pay

## 2017-10-06 ENCOUNTER — Encounter (HOSPITAL_COMMUNITY): Payer: Self-pay

## 2017-10-06 ENCOUNTER — Encounter (HOSPITAL_COMMUNITY)
Admission: RE | Admit: 2017-10-06 | Discharge: 2017-10-06 | Disposition: A | Discharge status: Home / Self Care | Payer: 59 | Source: Ambulatory Visit | Attending: Specialist | Admitting: Specialist

## 2017-10-06 DIAGNOSIS — I2510 Atherosclerotic heart disease of native coronary artery without angina pectoris: Secondary | ICD-10-CM | POA: Diagnosis not present

## 2017-10-06 DIAGNOSIS — J45909 Unspecified asthma, uncomplicated: Secondary | ICD-10-CM | POA: Diagnosis not present

## 2017-10-06 DIAGNOSIS — G35 Multiple sclerosis: Secondary | ICD-10-CM | POA: Diagnosis not present

## 2017-10-06 DIAGNOSIS — F419 Anxiety disorder, unspecified: Secondary | ICD-10-CM | POA: Diagnosis not present

## 2017-10-06 DIAGNOSIS — M960 Pseudarthrosis after fusion or arthrodesis: Secondary | ICD-10-CM | POA: Diagnosis present

## 2017-10-06 DIAGNOSIS — N189 Chronic kidney disease, unspecified: Secondary | ICD-10-CM | POA: Diagnosis not present

## 2017-10-06 DIAGNOSIS — G4730 Sleep apnea, unspecified: Secondary | ICD-10-CM | POA: Diagnosis not present

## 2017-10-06 DIAGNOSIS — Z888 Allergy status to other drugs, medicaments and biological substances status: Secondary | ICD-10-CM | POA: Diagnosis not present

## 2017-10-06 DIAGNOSIS — Z886 Allergy status to analgesic agent status: Secondary | ICD-10-CM | POA: Diagnosis not present

## 2017-10-06 DIAGNOSIS — Z79899 Other long term (current) drug therapy: Secondary | ICD-10-CM | POA: Diagnosis not present

## 2017-10-06 LAB — TYPE AND SCREEN
ABO/RH(D): A NEG
ANTIBODY SCREEN: NEGATIVE

## 2017-10-06 LAB — COMPREHENSIVE METABOLIC PANEL
ALBUMIN: 4.3 g/dL (ref 3.5–5.0)
ALK PHOS: 41 U/L (ref 38–126)
ALT: 34 U/L (ref 17–63)
ANION GAP: 8 (ref 5–15)
AST: 23 U/L (ref 15–41)
BILIRUBIN TOTAL: 1.1 mg/dL (ref 0.3–1.2)
BUN: 15 mg/dL (ref 6–20)
CALCIUM: 9.1 mg/dL (ref 8.9–10.3)
CO2: 26 mmol/L (ref 22–32)
Chloride: 103 mmol/L (ref 101–111)
Creatinine, Ser: 0.89 mg/dL (ref 0.61–1.24)
GFR calc Af Amer: >60 mL/min (ref >60)
GFR calc non Af Amer: >60 mL/min (ref >60)
GLUCOSE: 98 mg/dL (ref 65–99)
Potassium: 4.4 mmol/L (ref 3.5–5.1)
Sodium: 137 mmol/L (ref 135–145)
TOTAL PROTEIN: 6.8 g/dL (ref 6.5–8.1)

## 2017-10-06 LAB — CBC
HEMATOCRIT: 45.3 % (ref 39.0–52.0)
HEMOGLOBIN: 15.2 g/dL (ref 13.0–17.0)
MCH: 30.2 pg (ref 26.0–34.0)
MCHC: 33.6 g/dL (ref 30.0–36.0)
MCV: 89.9 fL (ref 78.0–100.0)
Platelets: 280 10*3/uL (ref 150–400)
RBC: 5.04 MIL/uL (ref 4.22–5.81)
RDW: 13 % (ref 11.5–15.5)
WBC: 5.8 10*3/uL (ref 4.0–10.5)

## 2017-10-06 LAB — APTT: APTT: 27 s (ref 24–36)

## 2017-10-06 LAB — URINALYSIS, ROUTINE W REFLEX MICROSCOPIC
BACTERIA UA: NONE SEEN
Bilirubin Urine: NEGATIVE
Glucose, UA: NEGATIVE mg/dL
Ketones, ur: NEGATIVE mg/dL
Leukocytes, UA: NEGATIVE
Nitrite: NEGATIVE
PH: 7 (ref 5.0–8.0)
Protein, ur: NEGATIVE mg/dL
SPECIFIC GRAVITY, URINE: 1.015 (ref 1.005–1.030)

## 2017-10-06 LAB — SURGICAL PCR SCREEN
MRSA, PCR: NEGATIVE
Staphylococcus aureus: POSITIVE — AB

## 2017-10-06 LAB — PROTIME-INR
INR: 0.93
Prothrombin Time: 12.4 seconds (ref 11.4–15.2)

## 2017-10-06 NOTE — Progress Notes (Signed)
PCP - Dr. Holley Raring Cardiologist - Dr. Nadyne Coombes  Chest x-ray - 01/28/2018 EKG - 01/28/2018 Stress Test - per patient 2018; requesting records from Dr. Einar Gip ECHO - per patient 2018; requesting records Cardiac Cath - patient denies  Sleep Study - 2004, patient states he has mild OSA and does not wear CPAP  Anesthesia review: yes, hx of CAD  Patient denies shortness of breath, fever, cough and chest pain at PAT appointment   Patient verbalized understanding of instructions that were given to them at the PAT appointment. Patient was also instructed that they will need to review over the PAT instructions again at home before surgery.
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
## 2017-10-09 ENCOUNTER — Encounter (HOSPITAL_COMMUNITY): Payer: Self-pay | Admitting: *Deleted

## 2017-10-09 ENCOUNTER — Other Ambulatory Visit: Payer: Self-pay

## 2017-10-09 ENCOUNTER — Ambulatory Visit (HOSPITAL_COMMUNITY): Payer: 59 | Admitting: Vascular Surgery

## 2017-10-09 ENCOUNTER — Ambulatory Visit (HOSPITAL_COMMUNITY): Payer: 59 | Admitting: Anesthesiology

## 2017-10-09 ENCOUNTER — Observation Stay (HOSPITAL_COMMUNITY)
Admission: RE | Admit: 2017-10-09 | Discharge: 2017-10-10 | Disposition: A | Discharge status: Home / Self Care | Payer: 59 | Source: Ambulatory Visit | Attending: Specialist | Admitting: Specialist

## 2017-10-09 ENCOUNTER — Encounter (HOSPITAL_COMMUNITY)
Admission: RE | Disposition: A | Discharge status: Home / Self Care | Payer: Self-pay | Source: Ambulatory Visit | Attending: Specialist

## 2017-10-09 ENCOUNTER — Ambulatory Visit (HOSPITAL_COMMUNITY): Payer: 59

## 2017-10-09 DIAGNOSIS — Z888 Allergy status to other drugs, medicaments and biological substances status: Secondary | ICD-10-CM | POA: Insufficient documentation

## 2017-10-09 DIAGNOSIS — Z79899 Other long term (current) drug therapy: Secondary | ICD-10-CM | POA: Insufficient documentation

## 2017-10-09 DIAGNOSIS — G4730 Sleep apnea, unspecified: Secondary | ICD-10-CM | POA: Insufficient documentation

## 2017-10-09 DIAGNOSIS — Z886 Allergy status to analgesic agent status: Secondary | ICD-10-CM | POA: Insufficient documentation

## 2017-10-09 DIAGNOSIS — F419 Anxiety disorder, unspecified: Secondary | ICD-10-CM | POA: Insufficient documentation

## 2017-10-09 DIAGNOSIS — M960 Pseudarthrosis after fusion or arthrodesis: Secondary | ICD-10-CM | POA: Diagnosis not present

## 2017-10-09 DIAGNOSIS — Z419 Encounter for procedure for purposes other than remedying health state, unspecified: Secondary | ICD-10-CM

## 2017-10-09 DIAGNOSIS — G35 Multiple sclerosis: Secondary | ICD-10-CM | POA: Insufficient documentation

## 2017-10-09 DIAGNOSIS — N189 Chronic kidney disease, unspecified: Secondary | ICD-10-CM | POA: Insufficient documentation

## 2017-10-09 DIAGNOSIS — M4722 Other spondylosis with radiculopathy, cervical region: Secondary | ICD-10-CM

## 2017-10-09 DIAGNOSIS — I2510 Atherosclerotic heart disease of native coronary artery without angina pectoris: Secondary | ICD-10-CM | POA: Insufficient documentation

## 2017-10-09 DIAGNOSIS — J45909 Unspecified asthma, uncomplicated: Secondary | ICD-10-CM | POA: Insufficient documentation

## 2017-10-09 DIAGNOSIS — Z981 Arthrodesis status: Secondary | ICD-10-CM

## 2017-10-09 HISTORY — PX: POSTERIOR CERVICAL FUSION/FORAMINOTOMY: SHX5038

## 2017-10-09 SURGERY — POSTERIOR CERVICAL FUSION/FORAMINOTOMY LEVEL 2
Anesthesia: General

## 2017-10-09 MED ORDER — FLEET ENEMA 7-19 GM/118ML RE ENEM: 1.0000 | ENEMA | Freq: Once | RECTAL | Status: DC | PRN

## 2017-10-09 MED ORDER — LACTATED RINGERS IV SOLN: INTRAVENOUS | Status: DC

## 2017-10-09 MED ORDER — FENTANYL CITRATE (PF) 250 MCG/5ML IJ SOLN
INTRAMUSCULAR | Status: AC
  Filled 2017-10-09: qty 5

## 2017-10-09 MED ORDER — ONDANSETRON HCL 4 MG/2ML IJ SOLN
INTRAMUSCULAR | Status: DC | PRN
  Administered 2017-10-09: 4 mg via INTRAVENOUS

## 2017-10-09 MED ORDER — THROMBIN (RECOMBINANT) 20000 UNITS EX SOLR
CUTANEOUS | Status: DC | PRN
  Administered 2017-10-09: 20000 [IU] via TOPICAL

## 2017-10-09 MED ORDER — LIDOCAINE HCL (CARDIAC) PF 100 MG/5ML IV SOSY
PREFILLED_SYRINGE | INTRAVENOUS | Status: DC | PRN
  Administered 2017-10-09: 50 mg via INTRAVENOUS

## 2017-10-09 MED ORDER — DEXAMETHASONE SODIUM PHOSPHATE 10 MG/ML IJ SOLN
INTRAMUSCULAR | Status: DC | PRN
  Administered 2017-10-09: 10 mg via INTRAVENOUS

## 2017-10-09 MED ORDER — PHENYLEPHRINE 40 MCG/ML (10ML) SYRINGE FOR IV PUSH (FOR BLOOD PRESSURE SUPPORT)
PREFILLED_SYRINGE | INTRAVENOUS | Status: AC
  Filled 2017-10-09: qty 20

## 2017-10-09 MED ORDER — SODIUM CHLORIDE 0.9% FLUSH: 3.0000 mL | Freq: Two times a day (BID) | INTRAVENOUS | Status: DC

## 2017-10-09 MED ORDER — CLONAZEPAM 1 MG PO TABS
1.0000 mg | ORAL_TABLET | Freq: Two times a day (BID) | ORAL | Status: DC
  Administered 2017-10-09 – 2017-10-10 (×2): 1 mg via ORAL
  Filled 2017-10-09 (×2): qty 1

## 2017-10-09 MED ORDER — FENTANYL CITRATE (PF) 100 MCG/2ML IJ SOLN
25.0000 ug | INTRAMUSCULAR | Status: DC | PRN
  Administered 2017-10-09 (×2): 50 ug via INTRAVENOUS

## 2017-10-09 MED ORDER — BUPIVACAINE LIPOSOME 1.3 % IJ SUSP
20.0000 mL | INTRAMUSCULAR | Status: AC
  Administered 2017-10-09: 6 mL
  Filled 2017-10-09: qty 20
  Filled 2017-10-09: qty 10

## 2017-10-09 MED ORDER — METHOCARBAMOL 500 MG PO TABS
500.0000 mg | ORAL_TABLET | Freq: Four times a day (QID) | ORAL | Status: DC | PRN
  Administered 2017-10-09 – 2017-10-10 (×3): 500 mg via ORAL
  Filled 2017-10-09 (×2): qty 1

## 2017-10-09 MED ORDER — MIDAZOLAM HCL 5 MG/5ML IJ SOLN
INTRAMUSCULAR | Status: DC | PRN
  Administered 2017-10-09 (×2): 2 mg via INTRAVENOUS

## 2017-10-09 MED ORDER — LORAZEPAM 2 MG/ML IJ SOLN
INTRAMUSCULAR | Status: AC
  Filled 2017-10-09: qty 1

## 2017-10-09 MED ORDER — VITAMIN D 1000 UNITS PO TABS
1000.0000 [IU] | ORAL_TABLET | Freq: Every day | ORAL | Status: DC
  Administered 2017-10-09 – 2017-10-10 (×2): 1000 [IU] via ORAL
  Filled 2017-10-09 (×4): qty 1

## 2017-10-09 MED ORDER — POLYETHYLENE GLYCOL 3350 17 G PO PACK: 17.0000 g | PACK | Freq: Every day | ORAL | Status: DC | PRN

## 2017-10-09 MED ORDER — KETAMINE HCL 10 MG/ML IJ SOLN
INTRAMUSCULAR | Status: DC | PRN
  Administered 2017-10-09: 40 mg via INTRAVENOUS
  Administered 2017-10-09: 20 mg via INTRAVENOUS

## 2017-10-09 MED ORDER — ACETAMINOPHEN 325 MG PO TABS
650.0000 mg | ORAL_TABLET | ORAL | Status: DC | PRN
  Administered 2017-10-10 (×2): 650 mg via ORAL
  Filled 2017-10-09 (×4): qty 2

## 2017-10-09 MED ORDER — DEXTROSE 5 % IV SOLN
INTRAVENOUS | Status: DC | PRN
  Administered 2017-10-09: 40 ug/min via INTRAVENOUS

## 2017-10-09 MED ORDER — LORAZEPAM 2 MG/ML IJ SOLN
1.0000 mg | INTRAMUSCULAR | Status: DC | PRN
  Administered 2017-10-09 – 2017-10-10 (×2): 1 mg via INTRAVENOUS
  Filled 2017-10-09: qty 1

## 2017-10-09 MED ORDER — SODIUM CHLORIDE 0.9 % IV SOLN
INTRAVENOUS | Status: DC
  Administered 2017-10-09: 20:00:00 via INTRAVENOUS

## 2017-10-09 MED ORDER — SUGAMMADEX SODIUM 200 MG/2ML IV SOLN
INTRAVENOUS | Status: AC
Start: 2017-10-09 — End: ?
  Filled 2017-10-09: qty 2

## 2017-10-09 MED ORDER — DEXAMETHASONE SODIUM PHOSPHATE 10 MG/ML IJ SOLN
INTRAMUSCULAR | Status: AC
  Filled 2017-10-09: qty 3

## 2017-10-09 MED ORDER — POLYVINYL ALCOHOL 1.4 % OP SOLN
1.0000 [drp] | OPHTHALMIC | Status: DC | PRN
  Filled 2017-10-09: qty 15

## 2017-10-09 MED ORDER — ALBUMIN HUMAN 5 % IV SOLN
INTRAVENOUS | Status: DC | PRN
  Administered 2017-10-09: 17:00:00 via INTRAVENOUS

## 2017-10-09 MED ORDER — SODIUM CHLORIDE 0.9% FLUSH: 3.0000 mL | INTRAVENOUS | Status: DC | PRN

## 2017-10-09 MED ORDER — CEFAZOLIN SODIUM 1 G IJ SOLR
INTRAMUSCULAR | Status: AC
Start: 2017-10-09 — End: ?
  Filled 2017-10-09: qty 20

## 2017-10-09 MED ORDER — METHOCARBAMOL 1000 MG/10ML IJ SOLN
500.0000 mg | Freq: Four times a day (QID) | INTRAVENOUS | Status: DC | PRN
  Filled 2017-10-09: qty 5

## 2017-10-09 MED ORDER — PHENOL 1.4 % MT LIQD: 1.0000 | OROMUCOSAL | Status: DC | PRN

## 2017-10-09 MED ORDER — MIDAZOLAM HCL 2 MG/2ML IJ SOLN
INTRAMUSCULAR | Status: AC
  Filled 2017-10-09: qty 2

## 2017-10-09 MED ORDER — ONDANSETRON HCL 4 MG PO TABS: 4.0000 mg | ORAL_TABLET | Freq: Four times a day (QID) | ORAL | Status: DC | PRN

## 2017-10-09 MED ORDER — BISOPROLOL FUMARATE 10 MG PO TABS
10.0000 mg | ORAL_TABLET | Freq: Every day | ORAL | Status: DC
  Administered 2017-10-10: 10 mg via ORAL
  Filled 2017-10-09: qty 1

## 2017-10-09 MED ORDER — LACTATED RINGERS IV SOLN
INTRAVENOUS | Status: DC
  Administered 2017-10-09 (×2): via INTRAVENOUS

## 2017-10-09 MED ORDER — MEPERIDINE HCL 50 MG/ML IJ SOLN: 6.2500 mg | INTRAMUSCULAR | Status: DC | PRN

## 2017-10-09 MED ORDER — OXYCODONE HCL 5 MG PO TABS: 5.0000 mg | ORAL_TABLET | ORAL | Status: DC | PRN

## 2017-10-09 MED ORDER — ACETAMINOPHEN 650 MG RE SUPP: 650.0000 mg | RECTAL | Status: DC | PRN

## 2017-10-09 MED ORDER — OXYCODONE HCL 5 MG PO TABS
10.0000 mg | ORAL_TABLET | ORAL | Status: DC | PRN
Start: 2017-10-09 — End: 2017-10-10
  Administered 2017-10-09 – 2017-10-10 (×5): 10 mg via ORAL
  Filled 2017-10-09 (×5): qty 2

## 2017-10-09 MED ORDER — SODIUM CHLORIDE 0.9 % IV SOLN: 250.0000 mL | INTRAVENOUS | Status: DC

## 2017-10-09 MED ORDER — LIDOCAINE 2% (20 MG/ML) 5 ML SYRINGE
INTRAMUSCULAR | Status: AC
  Filled 2017-10-09: qty 10

## 2017-10-09 MED ORDER — PROPOFOL 10 MG/ML IV BOLUS
INTRAVENOUS | Status: DC | PRN
  Administered 2017-10-09: 50 mg via INTRAVENOUS
  Administered 2017-10-09: 150 mg via INTRAVENOUS

## 2017-10-09 MED ORDER — CHLORHEXIDINE GLUCONATE 4 % EX LIQD: 60.0000 mL | Freq: Once | CUTANEOUS | Status: DC

## 2017-10-09 MED ORDER — PHENYLEPHRINE HCL 10 MG/ML IJ SOLN
INTRAMUSCULAR | Status: AC
  Filled 2017-10-09: qty 2

## 2017-10-09 MED ORDER — CEFAZOLIN SODIUM-DEXTROSE 2-4 GM/100ML-% IV SOLN
2.0000 g | Freq: Three times a day (TID) | INTRAVENOUS | Status: AC
  Administered 2017-10-09 – 2017-10-10 (×2): 2 g via INTRAVENOUS
  Filled 2017-10-09 (×2): qty 100

## 2017-10-09 MED ORDER — METHOCARBAMOL 500 MG PO TABS
ORAL_TABLET | ORAL | Status: AC
  Filled 2017-10-09: qty 1

## 2017-10-09 MED ORDER — FENTANYL CITRATE (PF) 100 MCG/2ML IJ SOLN
INTRAMUSCULAR | Status: AC
  Filled 2017-10-09: qty 2

## 2017-10-09 MED ORDER — BUPIVACAINE HCL (PF) 0.5 % IJ SOLN
INTRAMUSCULAR | Status: AC
  Filled 2017-10-09: qty 30

## 2017-10-09 MED ORDER — MORPHINE SULFATE (PF) 4 MG/ML IV SOLN
2.0000 mg | INTRAVENOUS | Status: DC | PRN
  Administered 2017-10-10: 2 mg via INTRAVENOUS
  Filled 2017-10-09: qty 1

## 2017-10-09 MED ORDER — PROPOFOL 10 MG/ML IV BOLUS
INTRAVENOUS | Status: AC
  Filled 2017-10-09: qty 20

## 2017-10-09 MED ORDER — MENTHOL 3 MG MT LOZG: 1.0000 | LOZENGE | OROMUCOSAL | Status: DC | PRN

## 2017-10-09 MED ORDER — CEFAZOLIN SODIUM-DEXTROSE 2-4 GM/100ML-% IV SOLN
INTRAVENOUS | Status: AC
  Filled 2017-10-09: qty 100

## 2017-10-09 MED ORDER — PANTOPRAZOLE SODIUM 40 MG PO TBEC
80.0000 mg | DELAYED_RELEASE_TABLET | Freq: Every day | ORAL | Status: DC
  Administered 2017-10-10: 80 mg via ORAL
  Filled 2017-10-09: qty 2

## 2017-10-09 MED ORDER — ROCURONIUM BROMIDE 100 MG/10ML IV SOLN
INTRAVENOUS | Status: DC | PRN
  Administered 2017-10-09: 10 mg via INTRAVENOUS
  Administered 2017-10-09: 20 mg via INTRAVENOUS
  Administered 2017-10-09 (×2): 10 mg via INTRAVENOUS
  Administered 2017-10-09: 50 mg via INTRAVENOUS
  Administered 2017-10-09: 10 mg via INTRAVENOUS
  Administered 2017-10-09: 20 mg via INTRAVENOUS

## 2017-10-09 MED ORDER — PHENYLEPHRINE 40 MCG/ML (10ML) SYRINGE FOR IV PUSH (FOR BLOOD PRESSURE SUPPORT)
PREFILLED_SYRINGE | INTRAVENOUS | Status: DC | PRN
  Administered 2017-10-09 (×4): 80 ug via INTRAVENOUS

## 2017-10-09 MED ORDER — BUPIVACAINE HCL 0.5 % IJ SOLN
INTRAMUSCULAR | Status: DC | PRN
  Administered 2017-10-09: 6 mL

## 2017-10-09 MED ORDER — MELOXICAM 7.5 MG PO TABS
15.0000 mg | ORAL_TABLET | Freq: Every day | ORAL | Status: DC
  Administered 2017-10-10: 15 mg via ORAL
  Filled 2017-10-09: qty 2

## 2017-10-09 MED ORDER — CEFAZOLIN SODIUM-DEXTROSE 2-4 GM/100ML-% IV SOLN
2.0000 g | INTRAVENOUS | Status: AC
  Administered 2017-10-09 (×2): 2 g via INTRAVENOUS

## 2017-10-09 MED ORDER — BISACODYL 5 MG PO TBEC: 5.0000 mg | DELAYED_RELEASE_TABLET | Freq: Every day | ORAL | Status: DC | PRN

## 2017-10-09 MED ORDER — ONDANSETRON HCL 4 MG/2ML IJ SOLN
INTRAMUSCULAR | Status: AC
  Filled 2017-10-09: qty 6

## 2017-10-09 MED ORDER — FENTANYL CITRATE (PF) 100 MCG/2ML IJ SOLN
INTRAMUSCULAR | Status: DC | PRN
  Administered 2017-10-09 (×2): 50 ug via INTRAVENOUS
  Administered 2017-10-09: 100 ug via INTRAVENOUS
  Administered 2017-10-09 (×2): 50 ug via INTRAVENOUS
  Administered 2017-10-09 (×2): 100 ug via INTRAVENOUS

## 2017-10-09 MED ORDER — METOPROLOL TARTRATE 100 MG PO TABS
100.0000 mg | ORAL_TABLET | Freq: Two times a day (BID) | ORAL | Status: DC | PRN
  Administered 2017-10-10: 100 mg via ORAL
  Filled 2017-10-09: qty 1

## 2017-10-09 MED ORDER — METOCLOPRAMIDE HCL 5 MG/ML IJ SOLN: 10.0000 mg | Freq: Once | INTRAMUSCULAR | Status: DC | PRN

## 2017-10-09 MED ORDER — ROCURONIUM BROMIDE 10 MG/ML (PF) SYRINGE
PREFILLED_SYRINGE | INTRAVENOUS | Status: AC
Start: 2017-10-09 — End: ?
  Filled 2017-10-09: qty 5

## 2017-10-09 MED ORDER — ALBUTEROL SULFATE (2.5 MG/3ML) 0.083% IN NEBU: 2.5000 mg | INHALATION_SOLUTION | Freq: Four times a day (QID) | RESPIRATORY_TRACT | Status: DC | PRN

## 2017-10-09 MED ORDER — KETAMINE HCL 10 MG/ML IJ SOLN
INTRAMUSCULAR | Status: AC
  Filled 2017-10-09: qty 1

## 2017-10-09 MED ORDER — ALUM & MAG HYDROXIDE-SIMETH 200-200-20 MG/5ML PO SUSP: 30.0000 mL | Freq: Four times a day (QID) | ORAL | Status: DC | PRN

## 2017-10-09 MED ORDER — DOCUSATE SODIUM 100 MG PO CAPS
100.0000 mg | ORAL_CAPSULE | Freq: Two times a day (BID) | ORAL | Status: DC
  Administered 2017-10-09 – 2017-10-10 (×2): 100 mg via ORAL
  Filled 2017-10-09 (×2): qty 1

## 2017-10-09 MED ORDER — THROMBIN (RECOMBINANT) 20000 UNITS EX SOLR
CUTANEOUS | Status: AC
Start: 2017-10-09 — End: ?
  Filled 2017-10-09: qty 20000

## 2017-10-09 MED ORDER — SUGAMMADEX SODIUM 500 MG/5ML IV SOLN
INTRAVENOUS | Status: DC | PRN
  Administered 2017-10-09: 200 mg via INTRAVENOUS

## 2017-10-09 MED ORDER — ONDANSETRON HCL 4 MG/2ML IJ SOLN: 4.0000 mg | Freq: Four times a day (QID) | INTRAMUSCULAR | Status: DC | PRN

## 2017-10-09 MED ORDER — ROCURONIUM BROMIDE 10 MG/ML (PF) SYRINGE
PREFILLED_SYRINGE | INTRAVENOUS | Status: AC
Start: 2017-10-09 — End: ?
  Filled 2017-10-09: qty 15

## 2017-10-09 MED ORDER — SALINE SENSITIVE EYES SOLN: 1.0000 [drp] | Freq: Every day | Status: DC | PRN

## 2017-10-09 MED ORDER — OXYCODONE HCL ER 15 MG PO T12A
15.0000 mg | EXTENDED_RELEASE_TABLET | Freq: Two times a day (BID) | ORAL | Status: DC
  Administered 2017-10-09 – 2017-10-10 (×2): 15 mg via ORAL
  Filled 2017-10-09 (×2): qty 1

## 2017-10-09 SURGICAL SUPPLY — 69 items
BIT DRILL MOUNTAINEER FIX 14 (BIT) ×1
BIT DRILL MOUNTAINEER FIX 14MM (BIT) ×1 IMPLANT
BIT DRILL NEURO 2X3.1 SFT TUCH (MISCELLANEOUS) ×1 IMPLANT
BLADE CLIPPER SURG (BLADE) IMPLANT
BONE VIVIGEN FORMABLE 5.4CC (Bone Implant) ×2 IMPLANT
BUR RND FLUTED 2.5 (BURR) IMPLANT
COLLAR CERV LO CONTOUR FIRM DE (SOFTGOODS) ×4 IMPLANT
COVER SURGICAL LIGHT HANDLE (MISCELLANEOUS) ×2 IMPLANT
DERMABOND ADVANCED (GAUZE/BANDAGES/DRESSINGS) ×2
DERMABOND ADVANCED .7 DNX12 (GAUZE/BANDAGES/DRESSINGS) ×2 IMPLANT
DRAPE C-ARM 42X72 X-RAY (DRAPES) ×2 IMPLANT
DRAPE HALF SHEET 40X57 (DRAPES) ×4 IMPLANT
DRAPE MICROSCOPE LEICA (MISCELLANEOUS) ×2 IMPLANT
DRAPE ORTHO SPLIT 77X108 STRL (DRAPES) ×1
DRAPE PED LAPAROTOMY (DRAPES) ×2 IMPLANT
DRAPE SURG 17X23 STRL (DRAPES) ×8 IMPLANT
DRAPE SURG ORHT 6 SPLT 77X108 (DRAPES) ×1 IMPLANT
DRILL BIT MOUNTAINEER FIX 14MM (BIT) ×1
DRILL NEURO 2X3.1 SOFT TOUCH (MISCELLANEOUS) ×2
DRSG MEPILEX BORDER 4X4 (GAUZE/BANDAGES/DRESSINGS) ×2 IMPLANT
DRSG MEPILEX BORDER 4X8 (GAUZE/BANDAGES/DRESSINGS) IMPLANT
DURAPREP 6ML APPLICATOR 50/CS (WOUND CARE) ×2 IMPLANT
ELECT BLADE 4.0 EZ CLEAN MEGAD (MISCELLANEOUS) ×2
ELECT CAUTERY BLADE 6.4 (BLADE) ×2 IMPLANT
ELECT REM PT RETURN 9FT ADLT (ELECTROSURGICAL) ×2
ELECTRODE BLDE 4.0 EZ CLN MEGD (MISCELLANEOUS) ×1 IMPLANT
ELECTRODE REM PT RTRN 9FT ADLT (ELECTROSURGICAL) ×1 IMPLANT
EVACUATOR 1/8 PVC DRAIN (DRAIN) IMPLANT
GAUZE SPONGE 4X4 12PLY STRL (GAUZE/BANDAGES/DRESSINGS) ×2 IMPLANT
GLOVE BIOGEL PI IND STRL 8 (GLOVE) ×1 IMPLANT
GLOVE BIOGEL PI INDICATOR 8 (GLOVE) ×1
GLOVE ECLIPSE 9.0 STRL (GLOVE) ×2 IMPLANT
GLOVE ORTHO TXT STRL SZ7.5 (GLOVE) ×2 IMPLANT
GLOVE SURG 8.5 LATEX PF (GLOVE) ×2 IMPLANT
GOWN STRL REUS W/ TWL LRG LVL3 (GOWN DISPOSABLE) ×1 IMPLANT
GOWN STRL REUS W/TWL 2XL LVL3 (GOWN DISPOSABLE) ×4 IMPLANT
GOWN STRL REUS W/TWL LRG LVL3 (GOWN DISPOSABLE) ×1
KIT BASIN OR (CUSTOM PROCEDURE TRAY) ×2 IMPLANT
KIT TURNOVER KIT B (KITS) ×2 IMPLANT
MANIFOLD NEPTUNE II (INSTRUMENTS) ×2 IMPLANT
NEEDLE SPNL 18GX3.5 QUINCKE PK (NEEDLE) ×2 IMPLANT
NS IRRIG 1000ML POUR BTL (IV SOLUTION) ×2 IMPLANT
PACK ORTHO CERVICAL (CUSTOM PROCEDURE TRAY) ×2 IMPLANT
PAD ARMBOARD 7.5X6 YLW CONV (MISCELLANEOUS) ×4 IMPLANT
PATTIES SURGICAL .25X.25 (GAUZE/BANDAGES/DRESSINGS) ×2 IMPLANT
PATTIES SURGICAL .75X.75 (GAUZE/BANDAGES/DRESSINGS) IMPLANT
ROD MOUNTAINEER 3.5X60 (Rod) ×4 IMPLANT
ROD TEMPLATE MOUNTAINEER 120MM (ROD) ×4 IMPLANT
SCREW F A 3.5X14 (Screw) ×10 IMPLANT
SCREW INNER (Screw) ×12 IMPLANT
SCREW MOUNTAINEER 3.5X16 (Screw) ×1 IMPLANT
SCREW MOUNTAINEER 3.5X16MM (Screw) ×1 IMPLANT
SPONGE LAP 4X18 RFD (DISPOSABLE) ×6 IMPLANT
SPONGE SURGIFOAM ABS GEL 100 (HEMOSTASIS) IMPLANT
SUT ETHIBOND CT1 BRD #0 30IN (SUTURE) IMPLANT
SUT VIC AB 0 CT1 27 (SUTURE)
SUT VIC AB 0 CT1 27XBRD ANBCTR (SUTURE) IMPLANT
SUT VIC AB 1 CT1 27 (SUTURE) ×1
SUT VIC AB 1 CT1 27XBRD ANBCTR (SUTURE) ×1 IMPLANT
SUT VIC AB 2-0 CT1 27 (SUTURE)
SUT VIC AB 2-0 CT1 TAPERPNT 27 (SUTURE) IMPLANT
SUT VIC AB 2-0 UR6 27 (SUTURE) IMPLANT
SUT VIC AB 3-0 X1 27 (SUTURE) ×2 IMPLANT
SUT VICRYL 0 UR6 27IN ABS (SUTURE) ×2 IMPLANT
TAP MOUNTAINEER 3MM (TAP) ×2 IMPLANT
TOWEL OR 17X24 6PK STRL BLUE (TOWEL DISPOSABLE) ×2 IMPLANT
TOWEL OR 17X26 10 PK STRL BLUE (TOWEL DISPOSABLE) ×2 IMPLANT
TRAY FOLEY MTR SLVR 16FR STAT (SET/KITS/TRAYS/PACK) IMPLANT
WATER STERILE IRR 1000ML POUR (IV SOLUTION) ×2 IMPLANT

## 2017-10-09 NOTE — Anesthesia Postprocedure Evaluation (Signed)
Anesthesia Post Note  Patient: Joel Galloway  Procedure(s) Performed: POSTERIOR CERVICAL FUSION C5-6 AND C6-7 WITH POSTERIOR LATERAL MASS SCREWS AND RODS C5-7, POSTERIOR LEFT C6-7 FORAMINOTOMY, POSTERIOR LEFT ILIAC CREST BONE GRAFT HARVEST (N/A )     Patient location during evaluation: PACU Anesthesia Type: General Level of consciousness: awake and alert Pain management: pain level controlled Vital Signs Assessment: post-procedure vital signs reviewed and stable Respiratory status: spontaneous breathing, nonlabored ventilation, respiratory function stable and patient connected to nasal cannula oxygen Cardiovascular status: blood pressure returned to baseline and stable Postop Assessment: no apparent nausea or vomiting Anesthetic complications: no    Last Vitals:  Vitals:   10/09/17 2030 10/09/17 2101  BP: (!) 95/46 119/76  Pulse: 87 86  Resp: 10 18  Temp: 36.8 C 37.2 C  SpO2: 96% 100%    Last Pain:  Vitals:   10/09/17 2110  TempSrc:   PainSc: 5                  [name] DAVID

## 2017-10-09 NOTE — Anesthesia Preprocedure Evaluation (Signed)
Anesthesia Evaluation    Airway Mallampati: II  TM Distance: >3 FB Neck ROM: Full    Dental no notable dental hx.    Pulmonary asthma , sleep apnea ,    Pulmonary exam normal breath sounds clear to auscultation       Cardiovascular + CAD  Normal cardiovascular exam Rhythm:Regular Rate:Normal     Neuro/Psych Seizures -,  MS    GI/Hepatic   Endo/Other    Renal/GU      Musculoskeletal   Abdominal   Peds  Hematology   Anesthesia Other Findings   Reproductive/Obstetrics                            Anesthesia Physical Anesthesia Plan  ASA: II  Anesthesia Plan: General   Post-op Pain Management:    Induction: Intravenous  PONV Risk Score and Plan: 2 and Ondansetron, Dexamethasone and Treatment may vary due to age or medical condition  Airway Management Planned: Oral ETT  Additional Equipment:   Intra-op Plan:   Post-operative Plan: Extubation in OR  Informed Consent: I have reviewed the patients History and Physical, chart, labs and discussed the procedure including the risks, benefits and alternatives for the proposed anesthesia with the patient or authorized representative who has indicated his/her understanding and acceptance.   Dental advisory given  Plan Discussed with: CRNA  Anesthesia Plan Comments:         Anesthesia Quick Evaluation

## 2017-10-09 NOTE — Transfer of Care (Signed)
Immediate Anesthesia Transfer of Care Note  Patient: Joel Galloway  Procedure(s) Performed: POSTERIOR CERVICAL FUSION C5-6 AND C6-7 WITH POSTERIOR LATERAL MASS SCREWS AND RODS C5-7, POSTERIOR LEFT C6-7 FORAMINOTOMY, POSTERIOR LEFT ILIAC CREST BONE GRAFT HARVEST (N/A )  Patient Location: PACU  Anesthesia Type:General  Level of Consciousness: awake, oriented and patient cooperative  Airway & Oxygen Therapy: Patient Spontanous Breathing and Patient connected to nasal cannula oxygen  Post-op Assessment: Report given to RN, Post -op Vital signs reviewed and stable and Patient moving all extremities X 4  Post vital signs: Reviewed and stable  Last Vitals:  Vitals Value Taken Time  BP 111/74 10/09/2017  7:26 PM  Temp    Pulse 91 10/09/2017  7:35 PM  Resp 26 10/09/2017  7:35 PM  SpO2 100 % 10/09/2017  7:35 PM  Vitals shown include unvalidated device data.  Last Pain:  Vitals:   10/09/17 1212  TempSrc:   PainSc: 7       Patients Stated Pain Goal: 3 (88/11/03 1594)  Complications: No apparent anesthesia complications

## 2017-10-09 NOTE — Progress Notes (Signed)
Made Dr. Marcell Barlow aware of last time for eating and drinking. Also made him aware of patients concerns about anxiety in post op.  No further orders recieved

## 2017-10-09 NOTE — Anesthesia Procedure Notes (Signed)
Procedure Name: Intubation Date/Time: 10/09/2017 2:57 PM Performed by: Moshe Salisbury, CRNA Pre-anesthesia Checklist: Patient identified, Emergency Drugs available, Suction available and Patient being monitored Patient Re-evaluated:Patient Re-evaluated prior to induction Oxygen Delivery Method: Circle System Utilized Preoxygenation: Pre-oxygenation with 100% oxygen Induction Type: IV induction Ventilation: Mask ventilation without difficulty Laryngoscope Size: Glidescope and 4 Grade View: Grade I Tube type: Oral Tube size: 7.5 mm Number of attempts: 1 Airway Equipment and Method: Stylet Placement Confirmation: ETT inserted through vocal cords under direct vision,  positive ETCO2 and breath sounds checked- equal and bilateral Secured at: 22 cm Tube secured with: Tape Dental Injury: Teeth and Oropharynx as per pre-operative assessment

## 2017-10-09 NOTE — Brief Op Note (Signed)
10/09/2017  7:08 PM  PATIENT:  Joel Galloway  57 y.o. 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 Montez Hageman.  EBL:  300 mL   BLOOD ADMINISTERED:none  DRAINS: Urinary Catheter (Foley)   LOCAL MEDICATIONS USED:  MARCAINE 0.5% 1:1 EXPAREL 1.3% Amount: 15 ml cervical  And 10cc left iliac crest.   SPECIMEN:  No Specimen  DISPOSITION OF SPECIMEN:  N/A  COUNTS:  YES  TOURNIQUET:  * No tourniquets in log *  DICTATION: .Dragon Dictation  PLAN OF CARE: Admit for overnight observation  PATIENT DISPOSITION:  PACU - hemodynamically stable.   Delay start of Pharmacological VTE agent (>24hrs) due to surgical blood loss or risk of bleeding: yes

## 2017-10-09 NOTE — Interval H&P Note (Signed)
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 After consideration of risks, benefits and other options for treatment, the patient has consented to  Procedure(s): POSTERIOR CERVICAL FUSION C5-6 AND C6-7 WITH POSTERIOR LATERAL MASS SCREWS AND RODS C5-7, POSTERIOR LEFT C6-7 FORAMINOTOMY, POSTERIOR LEFT ILIAC CREST BONE GRAFT HARVEST (N/A) as a surgical intervention .  The patient's history has been reviewed, patient examined, no change in status, stable for surgery.  I have reviewed the patient's chart and labs.  Questions were answered to the patient's satisfaction.     [name]

## 2017-10-09 NOTE — Op Note (Signed)
10/09/2017  7:39 PM  PATIENT:  Joel Galloway  57 y.o. 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     COMPONENTS:   Implant Name Type Inv. Item Serial No. Manufacturer Lot No. LRB No. Used  BONE VIVIGEN FORMABLE 5.4CC - [id number] Bone Implant BONE VIVIGEN FORMABLE 5.4CC [id number] [facility]  N/A 1  SCREW F A 3.5X14 - [id number] Screw SCREW F A 3.5X14  [employer]  N/A 5  SCREW MOUNTAINEER 3.5X16MM - [id number] Screw SCREW MOUNTAINEER 3.5X16MM  [employer]  N/A 1  SCREW INNER - [id number] Screw SCREW INNER  [employer]  N/A 6  ROD MOUNTAINEER 3.5X60 - [id number] Rod ROD MOUNTAINEER 3.5X60  [place]  N/A 2    PROCEDURE:The patient was met in the holding area, and the appropriate posterior left C6-7 cervical level identified and marked with "x" and my initials. All questions were answered and informed consent signed. The patient was then transported to OR. The patient was then placed under general anesthesia without difficulty. A foley catheter was placed sterilely by OR nursing personnel. and transferred to the operating room table prone position [place] horseshoe with Oralia Manis. All pressure points well-padded PAS stockings.Shoulders taped down and skin over the posterior inferior aspect of the neck place  in traction to decrease skin folds. The patient received appropriate preoperative antibiotic 3 grams ancef. prophylaxis.Time-out procedure was called and correct.  Sterile prep with DuraPrep and draped in the usual manner the shoulders were taped downwards and skin traction over the skin of the neck. Following DuraPrep draped in the usual manner. After timeout protocol incision was made approximately C4 to T1 in the midline. This following infiltration of skin and subcutaneous layers with marcaine 0.5% 1:1 exparel 1.3%with 10 cc. Incision carried through skin and subcutaneous layers using 10 blade scalpel and electrocautery down to the level ligamentum nuchae. Incision made on either side on the spinous process of C5 Clamp then placed at the interspinous process space at C5 and intraoperative C-arm fluoroscopy identified the clamp at the C5 interspinous process level. Then a 2-0 Vicryl  was used to mark the spinous process of C5. Electrocautery then used to carefully incise the cervical muscles off the bilateral aspect of the spinous process of C5-6 and C6-7 and T1. Dividing the spinous muscles off of the inferior aspect lamina at C5 exposing the C5-6 posterior aspect of the interlaminar space bilaterally. Exposure was obtained out to the lateral aspect of the lateral masses of C5, C6 and C7 posteriorly. Bipolar electrocautery to control bleeding. The loupe magnification and headlamp were used during this portion procedure. Boss McCollough retractor was inserted. First a drill opening for placement of the left C6 lateral mass screw was placed. A burr used to open the posterior cortex with a 2 mm burr 62m medial and 1 mm inferior to  the center point of the left C6 lateral mass. The 14 mm drill then directed at the safe zone superolateral lateral mass using the spinous process to determine the divergance and directed superiorly in line with the superior articular facet surface. The depth gage used to assess the  correct depth and tapping then to 14 mm. The left C6-7 foramenotomy was then carried out. High-speed bur was used to remove a small portion of bone from the inferior aspect of lamina of left C6 and the medial 10% of the inferior articular process of C6. Further thinning the superior aspect of the lamina left C7. A 1 mm Kerrison was then used to remove him from superior aspect of the lamina C7 and the medial aspect of the inferior-articular process of C6 removing the medial 10%, exposing the superior articular process of C7. A 1 mm Kerrison was removed bone off the medial aspect of the superior articular process of C7 resecting 10% of the medial aspect of the superior articular process of C7. The foramen was found to be extremely tight. 1 mm kerrisons and microcurrette used to remove bone off the superior aspect of the right C7pedicle. Ligamentum flavum then easily lifted superiorly electrocautery unit cauterizing epidural veins deep to the ligamentum flavum then resecting the ligamentum flavum. The operating room microscope was draped sterilely and brought into the field. Under the operating room microscope the epidural vein layer overlying the posterior aspect of the thecal sac and the C7 nerve root was then carefully evaluated and the superior aspect of the right C7 superior articular process and reflected flavum were removed with a backward angle 3-0 microcurette bipolar electrocautery to control all bleeding within the axillary area of the C4 nerve. Bone wax was applied to bleeding cancellus bone surfaces as well as flow seal and thrombin soak gel foam excellent hemostasis obtained The neuroforamen explored using a Ball tipped nerve hook and  found to be decompressed. Following this then hemostasis was obtained using thrombin-soaked Gelfoam and micro-pledgettes. When complete hemostasis was obtained all gel foam was removed a nerve hook could be easily passed out the neuroforamen out the lateral aspect of the C7  pedicle demonstrate the C7 neuroforamen completely decompressed.  Attention then turned to performing the posterior fusion with lateral mass instrumentation. A modified Roy-Camille type approach to placement of lateral mass screws was used performing a small opening into the posterior aspect of the lateral mass just inferior and medial to the midpoint of the lateral mass at the intersection of a line between the midportion of sagittal line between the superior joint line at C5, C6 and C7 lateral border opening then made using a 2 mm drill with positive stop 14 mm drill hole depth was made directed at about 25 from the vertical and directed superiorly in line with the articular superior surface of the C5 superior to the process. This directed superior laterally and a careful exploration of the drill hole using a ball-tipped probe was used to drill hole depth of 14 mm. This was then tapped and the appropriate 14 mm 3.5 screw was then inserted obtaining excellent purchase. C5-6 facet was then carefully burred and decorticated. Right iliac crest bone was harveted via a separate incision left posterior iliac crest about 4cc in length. Subcutaneous layers and fascia incised over the left posterolateral iliac crest. Bone graft then harvested using1/4" osteotomes and gouges to remove cortical and corticocancellous bone graft. The iliac crest bone graft was then inserted into the bilateral  C5-6  decorticated facets along with any bone material that had been removed by the foraminotomy. Drill hole was then placed at the lateral mass of C6 on the right side and the bilateral C7 lateral masses and this was then done using the same modified Roy-Camille type approach with a cephalad directed screw performed laterally and then angle of about 27-30 from the vertical and draped in line with the superior articular process of C5, C6 and C7 on the right side. This was then carefully probed with ball-tip probe and found to be patent  with the opening fully within the lateral mass bone. This was then tapped and the appropriate 3.50 mm screw placed in the lateral mass at this level. Similar to the right side the same type of modified Roy-Camille screw was placed on the left side using a superior medial insertion site based on the point of the mid portion of the lateral mass directing superior and lateral lateral at about 27-30 and superior in line with the superior joint line at the C5-6  level for the C6 screw then C5-76fr the C7 screw. These holes each probed and probed within bone nicely demonstrating patency and there is screw fixation maintained within the lateral mass without penetration of the anterior cortex of the level. Excellent purchase was obtained, the lamina in the left C5, C6 and C7 facet was then decorticated with high-speed bur autogenous bone graft and vivigen material was then placed in the within the left C5-6 and C6-7 facets and along the lamina extending from C5-C7 on the left side and lateral aspect of the spinous process of C5-C7. An appropriate rod was then carefully measured cut and contoured and placed within the lateral mass screws on the left side and the appropriate size the fastener caps were then applied and screwed into place. These were then torqued to the appropriate 80 foot-pounds obtaining excellent fixation left side. Similarly on the right side rod was carefully measured and cut then contoured and placed within the lateral mass screws on the right side after contouring the 3 screw fastener caps then applied and then these were tied to 80 foot-pounds. No attempt was made to place patient in extension as this would tend to increase the degree of foraminal entrapment of the segment. Irrigation performed additional vivigen material was then placed lateral to the lateral mass screws on the left-hand side and again over the joint just anterior to the rod care was taken not to insert the graft into the inner  laminar area on the left C6-7 side. With this then fixation was complete permanent C-arm images were obtained in AP, oblique and lateral planes documenting the position alignment of the lateral mass screws were found to be in good position alignment. Incision was then closed.  Irrigation was carried out no active bleeding was present. The incision was closed by approximating the ligamentum nuchae with #1 vicryl sutures. The subcutaneous layers approximated with interrupted 0 Vicryl suture more superficial layers with interrupted 2-0 Vicryl sutures and the skin closed with interrupted 3-0 Vicryl sutures. Dermabond was applied then [employer] bandage. Soft cervical collar placed.The left iliac crest incision site then also closed with  Deep fascia #1 vicryl then 0 vicryl to the subcutaneous layers and then 2-0 Vicryl to approximate the skin and then  A running 3-0 vicryl used to close the incision. Dermabond was used to seal the skin. Mepilex dressing applied to the  Left posterior iliac crest bone graft harvest site and to the posterior  cervical spine incision site.   All instrument and sponge counts were correct. Patient was then returned to supine position on her stretcher. Returned to recovery room in satisfactory condition.     Physician assistant's responsibilities: Benjiman Core PA-C perform the duties of assistant physician and surgeon during this case present from the beginning of the case to the end of the case. He assisted with careful suctioning of blood near the neural structures including cervical cord and C7 nerve root. Performed closure of the incision on the ligamentum nuchae to the skin and application of dressing. He assisted in positioning the patient had removal the patient from the OR table to her stretcher.            Mickie Kozikowski  10/09/2017, 7:39 PM

## 2017-10-10 DIAGNOSIS — M960 Pseudarthrosis after fusion or arthrodesis: Secondary | ICD-10-CM | POA: Diagnosis not present

## 2017-10-10 LAB — BASIC METABOLIC PANEL
ANION GAP: 9 (ref 5–15)
BUN: 12 mg/dL (ref 6–20)
CALCIUM: 8.6 mg/dL — AB (ref 8.9–10.3)
CO2: 25 mmol/L (ref 22–32)
Chloride: 104 mmol/L (ref 101–111)
Creatinine, Ser: 0.98 mg/dL (ref 0.61–1.24)
GFR calc non Af Amer: >60 mL/min (ref >60)
Glucose, Bld: 151 mg/dL — ABNORMAL HIGH (ref 65–99)
POTASSIUM: 4 mmol/L (ref 3.5–5.1)
Sodium: 138 mmol/L (ref 135–145)

## 2017-10-10 LAB — CBC
HEMATOCRIT: 35.6 % — AB (ref 39.0–52.0)
HEMOGLOBIN: 12.3 g/dL — AB (ref 13.0–17.0)
MCH: 31.1 pg (ref 26.0–34.0)
MCHC: 34.6 g/dL (ref 30.0–36.0)
MCV: 89.9 fL (ref 78.0–100.0)
Platelets: 324 10*3/uL (ref 150–400)
RBC: 3.96 MIL/uL — ABNORMAL LOW (ref 4.22–5.81)
RDW: 13 % (ref 11.5–15.5)
WBC: 18.4 10*3/uL — AB (ref 4.0–10.5)

## 2017-10-10 MED ORDER — OXYCODONE HCL 10 MG PO TABS: 10.0000 mg | ORAL_TABLET | ORAL | 0 refills | Status: DC | PRN

## 2017-10-10 MED ORDER — OXYCODONE HCL ER 15 MG PO T12A: 15.0000 mg | EXTENDED_RELEASE_TABLET | Freq: Two times a day (BID) | ORAL | 0 refills | Status: DC

## 2017-10-10 MED ORDER — DOCUSATE SODIUM 100 MG PO CAPS: 100.0000 mg | ORAL_CAPSULE | Freq: Two times a day (BID) | ORAL | 0 refills | Status: DC

## 2017-10-10 MED ORDER — NALOXONE HCL 4 MG/0.1ML NA LIQD: NASAL | 1 refills | Status: DC

## 2017-10-10 MED ORDER — LORAZEPAM 0.5 MG PO TABS: 0.5000 mg | ORAL_TABLET | ORAL | 0 refills | Status: AC | PRN

## 2017-10-10 NOTE — Evaluation (Signed)
Physical Therapy Evaluation Patient Details Name: Joel Galloway MRN: [id number] DOB: [birth year] Today's Date: 10/10/2017   History of Present Illness  Pt. is a 57 y.o. M with significant PMH of MS and previous cervical and lumbar fusion surgeries. Now s/p posterior cervical fusion C5-6 and C6-7, posterior left C6-7 foraminotomy.  Clinical Impression  Patient evaluated by Physical Therapy with no further acute PT needs identified. All education has been completed and the patient has no further questions. Patient presenting close to baseline with functional mobility at a modified independent level with ambulation using no assistive device. Patient has very poor pain control and reports increase in pain with any movement. Could benefit in the future from outpatient PT to address chronic pain management. No equipment needs. PT is signing off. Thank you for this referral.     Follow Up Recommendations No PT follow up    Equipment Recommendations  None recommended by PT    Recommendations for Other Services       Precautions / Restrictions Precautions Precautions: Fall;Cervical Precaution Booklet Issued: Yes (comment) Precaution Comments: Verbally reviewed precautions and provided written handout Required Braces or Orthoses: Cervical Brace Cervical Brace: Soft collar Restrictions Weight Bearing Restrictions: No      Mobility  Bed Mobility Overal bed mobility: Modified Independent             General bed mobility comments: increased time  Transfers Overall transfer level: Independent Equipment used: None                Ambulation/Gait Ambulation/Gait assistance: Modified independent (Device/Increase time) Ambulation Distance (Feet): 100 Feet Assistive device: None Gait Pattern/deviations: Step-through pattern;Wide base of support Gait velocity: decreased   General Gait Details: slow and steady gait with no AD. RLE external rotation throughout (most likely  baseline).  Stairs            Wheelchair Mobility    Modified Rankin (Stroke Patients Only)       Balance Overall balance assessment: No apparent balance deficits (not formally assessed)                                           Pertinent Vitals/Pain Pain Assessment: Faces Faces Pain Scale: Hurts little more Pain Location: Surgical site and bilateral upper trapezius. Patient would not give numerical value, stating he does not "do scales."  Pain Descriptors / Indicators: Guarding Pain Intervention(s): Monitored during session;Patient requesting pain meds-RN notified    Home Living Family/patient expects to be discharged to:: Private residence Living Arrangements: Spouse/significant other Available Help at Discharge: Family;Available 24 hours/day(wife first week) Type of Home: House Home Access: Ramped entrance     Home Layout: Two level;Able to live on main level with bedroom/bathroom Home Equipment: Kasandra Knudsen - single point;Shower seat      Prior Function Level of Independence: Independent         Comments: Unemployed, independent with mobility and ADL's     Hand Dominance   Dominant Hand: Right    Extremity/Trunk Assessment   Upper Extremity Assessment Upper Extremity Assessment: Defer to OT evaluation    Lower Extremity Assessment Lower Extremity Assessment: RLE deficits/detail;Overall WFL for tasks assessed RLE Deficits / Details: external rotation during gait (most likely baseline)    Cervical / Trunk Assessment Cervical / Trunk Assessment: Other exceptions Cervical / Trunk Exceptions: s/p posterior cervical fusion and wearing soft collar with forward  head posture  Communication   Communication: No difficulties  Cognition Arousal/Alertness: Awake/alert Behavior During Therapy: WFL for tasks assessed/performed Overall Cognitive Status: Within Functional Limits for tasks assessed                                         General Comments General comments (skin integrity, edema, etc.): Patient is very fixated on deficits and pain.     Exercises     Assessment/Plan    PT Assessment Patent does not need any further PT services  PT Problem List         PT Treatment Interventions      PT Goals (Current goals can be found in the Care Plan section)  Acute Rehab PT Goals Patient Stated Goal: decrease pain PT Goal Formulation: All assessment and education complete, DC therapy    Frequency     Barriers to discharge        Co-evaluation               AM-PAC PT "6 Clicks" Daily Activity  Outcome Measure Difficulty turning over in bed (including adjusting bedclothes, sheets and blankets)?: A Little Difficulty moving from lying on back to sitting on the side of the bed? : A Little Difficulty sitting down on and standing up from a chair with arms (e.g., wheelchair, bedside commode, etc,.)?: None Help needed moving to and from a bed to chair (including a wheelchair)?: None Help needed walking in hospital room?: None Help needed climbing 3-5 steps with a railing? : A Little 6 Click Score: 21    End of Session Equipment Utilized During Treatment: Other (comment)((patient refused gait belt)) Activity Tolerance: Patient limited by pain Patient left: in bed;with call bell/phone within reach Nurse Communication: Mobility status PT Visit Diagnosis: Difficulty in walking, not elsewhere classified (R26.2);Pain Pain - part of body: (neck)    Time: 8185-6314 PT Time Calculation (min) (ACUTE ONLY): 17 min   Charges:   PT Evaluation $PT Eval Moderate Complexity: 1 Mod     PT G Codes:       Ellamae Sia, PT, DPT Acute Rehabilitation Services  Pager: [phone]   Willy Eddy 10/10/2017, 10:20 AM

## 2017-10-10 NOTE — Progress Notes (Signed)
Pt. discharged home accompanied by spouse. Prescriptions and discharge instructions given with verbalization of understanding. Incision site on neck and Lt. hip with no s/s of infection - no swelling, redness, bleeding, and/or drainage noted. Extra dressing given for home use. Soft collar intact. And Aspen collar given per MD request. Pain med given just before leaving. Opportunity given to ask questions but no question asked. Pt. transported out of this unit in wheelchair by the volunteer. Patient has an appointment with MD.

## 2017-10-10 NOTE — Progress Notes (Signed)
     Subjective: 1 Day Post-Op Procedure(s) (LRB): POSTERIOR CERVICAL FUSION C5-6 AND C6-7 WITH POSTERIOR LATERAL MASS SCREWS AND RODS C5-7, POSTERIOR LEFT C6-7 FORAMINOTOMY, POSTERIOR LEFT ILIAC CREST BONE GRAFT HARVEST (N/A) Awake ,alert and oriented x 4. It hurts like my low back did. It's the muscles, they are in spasm. Can I have some thing for spasm.   Patient reports pain as moderate.    Objective:   VITALS:  Temp:  [97.4 F (36.3 C)-98.9 F (37.2 C)] 98.6 F (37 C) (05/25 0719) Pulse Rate:  [75-96] 90 (05/25 0719) Resp:  [7-20] 18 (05/25 0719) BP: (90-122)/(46-83) 111/72 (05/25 0719) SpO2:  [96 %-100 %] 100 % (05/25 0719) Weight:  [194 lb (88 kg)] 194 lb (88 kg) (05/24 1212)  Neurologically intact ABD soft Neurovascular intact Sensation intact distally Intact pulses distally Dorsiflexion/Plantar flexion intact Incision: no drainage and left iliac crest with spot unchanged dry blood since PACU No cellulitis present Compartment soft Dressing changed left iliac crest.    LABS Recent Labs    10/10/17 0604  HGB 12.3*  WBC 18.4*  PLT 324   Recent Labs    10/10/17 0604  NA 138  K 4.0  CL 104  CO2 25  BUN 12  CREATININE 0.98  GLUCOSE 151*   No results for input(s): LABPT, INR in the last 72 hours.   Assessment/Plan: 1 Day Post-Op Procedure(s) (LRB): POSTERIOR CERVICAL FUSION C5-6 AND C6-7 WITH POSTERIOR LATERAL MASS SCREWS AND RODS C5-7, POSTERIOR LEFT C6-7 FORAMINOTOMY, POSTERIOR LEFT ILIAC CREST BONE GRAFT HARVEST (N/A)  Advance diet Up with therapy D/C IV fluids Discharge home with home health  Grove Defina 10/10/2017, 9:37 AMPatient ID: Joel Galloway, male   DOB: November 11, 1960, 57 y.o.   MRN: [id number]

## 2017-10-10 NOTE — Evaluation (Signed)
Occupational Therapy Evaluation Patient Details Name: Joel Galloway MRN: [id number] DOB: Nov 29, 1960 Today's Date: 10/10/2017    History of Present Illness Pt. is a 57 y.o. M with significant PMH of MS and previous cervical and lumbar fusion surgeries. Now s/p posterior cervical fusion C5-6 and C6-7, posterior left C6-7 foraminotomy.   Clinical Impression   PTA, pt was living with his wife and was independent. Currently, pt requires Min A for UB ADLs, Min Guard A for LB ADLs, and Supervision for functional mobility. Provided education on cervical precautions, collar management, UB ADLs, LB ADLs, bed mobility, sleep positioning, toileting, and shower transfer; pt demonstrated and verbalized understanding. Answered all pt questions. Recommend dc home once medically stable per physician. All acute OT needs met and will sign off. Thank you.     Follow Up Recommendations  No OT follow up;Supervision/Assistance - 24 hour    Equipment Recommendations  None recommended by OT    Recommendations for Other Services PT consult     Precautions / Restrictions Precautions Precautions: Fall;Cervical Precaution Booklet Issued: Yes (comment) Precaution Comments: Verbally reviewed precautions and provided written handout Required Braces or Orthoses: Cervical Brace Cervical Brace: Soft collar Restrictions Weight Bearing Restrictions: No      Mobility Bed Mobility Overal bed mobility: Modified Independent             General bed mobility comments: increased time  Transfers Overall transfer level: Independent Equipment used: None                  Balance Overall balance assessment: No apparent balance deficits (not formally assessed)                                         ADL either performed or assessed with clinical judgement   ADL Overall ADL's : Needs assistance/impaired Eating/Feeding: Set up;Sitting   Grooming: Set  up;Supervision/safety;Standing Grooming Details (indicate cue type and reason): Educating pt on compensatory techniques for oral care. Upper Body Bathing: Minimal assistance;Sitting   Lower Body Bathing: Min guard;Sit to/from stand   Upper Body Dressing : Minimal assistance;Sitting Upper Body Dressing Details (indicate cue type and reason): Min A for donning shirt. Providing education on compensatory techniques for donning/doffing shirts. Pt requiring Max encouragment. Educating pt on hard collar management and donning/doffing. Pt requiring Min A for donning collar. Lower Body Dressing: Min guard Lower Body Dressing Details (indicate cue type and reason): Educating pt on comepsatory techniques Toilet Transfer: Supervision/safety     Toileting - Clothing Manipulation Details (indicate cue type and reason): Educating pt on toilet hygiene and compensatory techniques   Tub/Shower Transfer Details (indicate cue type and reason): Discussed safe shower transfer Functional mobility during ADLs: Supervision/safety General ADL Comments: Pt self limiting due to pain. Providing education on cervical precautions, collar management, bed mobility. sleep positioning, UB ADLs, LB ADLs, oral care, toileting, and shower transfer.      Vision         Perception     Praxis      Pertinent Vitals/Pain Pain Assessment: Faces Pain Score: 9  Faces Pain Scale: Hurts little more Pain Location: Surgical site and bilateral upper trapezius. Pain Descriptors / Indicators: Guarding Pain Intervention(s): Monitored during session;Limited activity within patient's tolerance;Repositioned     Hand Dominance Right   Extremity/Trunk Assessment Upper Extremity Assessment Upper Extremity Assessment: RUE deficits/detail;LUE deficits/detail RUE Deficits / Details: Pt reporting  limited shoulder ROM due to pain presenting at 0-20 degrees forward shoulder flexion. Pt stating "he cut through a huge chunk of muscle. I  can't move that much anymore." Pt able to performing 0-85degrees shoulder flexion during ADLs without cervical precaution range.  RUE: Unable to fully assess due to pain LUE Deficits / Details: Pt reporting limited shoulder ROM due to pain presenting at 0-20 degrees forward shoulder flexion. Pt stating "he cut through a huge chunk of muscle. I can't move that much anymore." Pt able to performing 0-85degrees shoulder flexion during ADLs without cervical precaution range.  LUE: Unable to fully assess due to pain   Lower Extremity Assessment Lower Extremity Assessment: Defer to PT evaluation RLE Deficits / Details: external rotation during gait (most likely baseline)   Cervical / Trunk Assessment Cervical / Trunk Assessment: Other exceptions Cervical / Trunk Exceptions: s/p posterior cervical fusion and wearing soft collar with forward head posture   Communication Communication Communication: No difficulties   Cognition Arousal/Alertness: Awake/alert Behavior During Therapy: Anxious Overall Cognitive Status: Impaired/Different from baseline Area of Impairment: Problem solving;Attention                   Current Attention Level: Selective         Problem Solving: Requires verbal cues;Requires tactile cues General Comments: Pt perseverating on limited function due to pain. Internally distracted and requiring Min cues to return to education.   General Comments  Patient is very fixated on deficits and pain.     Exercises     Shoulder Instructions      Home Living Family/patient expects to be discharged to:: Private residence Living Arrangements: Spouse/significant other Available Help at Discharge: Family;Available 24 hours/day(wife first week) Type of Home: House Home Access: Ramped entrance     Home Layout: Two level;Able to live on main level with bedroom/bathroom Alternate Level Stairs-Number of Steps: 13   Bathroom Shower/Tub: Walk-in shower;Tub only   Bathroom  Toilet: Standard     Home Equipment: Cane - single point;Shower seat          Prior Functioning/Environment Level of Independence: Independent        Comments: Unemployed, independent with mobility and ADL's        OT Problem List: Decreased strength;Decreased range of motion;Decreased activity tolerance;Impaired balance (sitting and/or standing);Decreased knowledge of use of DME or AE;Decreased knowledge of precautions;Pain      OT Treatment/Interventions:      OT Goals(Current goals can be found in the care plan section) Acute Rehab OT Goals Patient Stated Goal: decrease pain OT Goal Formulation: All assessment and education complete, DC therapy  OT Frequency:     Barriers to D/C:            Co-evaluation              AM-PAC PT "6 Clicks" Daily Activity     Outcome Measure Help from another person eating meals?: None Help from another person taking care of personal grooming?: A Little Help from another person toileting, which includes using toliet, bedpan, or urinal?: A Little Help from another person bathing (including washing, rinsing, drying)?: A Little Help from another person to put on and taking off regular upper body clothing?: A Little Help from another person to put on and taking off regular lower body clothing?: A Little 6 Click Score: 19   End of Session Equipment Utilized During Treatment: Cervical collar Nurse Communication: Mobility status;Precautions  Activity Tolerance: Patient limited by pain Patient  left: with call bell/phone within reach;in bed  OT Visit Diagnosis: Unsteadiness on feet (R26.81);Other abnormalities of gait and mobility (R26.89);Muscle weakness (generalized) (M62.81);Pain Pain - Right/Left: (bilateral) Pain - part of body: Shoulder(Neck)                Time: 5465-0354 OT Time Calculation (min): 17 min Charges:  OT General Charges $OT Visit: 1 Visit OT Evaluation $OT Eval Low Complexity: 1 Low G-Codes:     [place], OTR/L Acute Rehab Pager: [phone] Office: [place] 10/10/2017, 10:57 AM

## 2017-10-13 ENCOUNTER — Telehealth (INDEPENDENT_AMBULATORY_CARE_PROVIDER_SITE_OTHER): Payer: Self-pay | Admitting: Radiology

## 2017-10-13 NOTE — Telephone Encounter (Signed)
Noted, thank you

## 2017-10-13 NOTE — Telephone Encounter (Signed)
Patient's wife came in to the office today requesting more bandages. I explained that we wanted for him to keep on the original bandages from surgery until follow up in the office unless he had increased draining, etc. She states that they are not having any problems, just that he wants to take a shower. I advised that the bandages he has on should be able to be used with the shower. She states that he has already removed the one from his hip. She is covering that with dry gauze and tape. I advised to keep this covered as well, and to continue dry dressing. She will put dry dressing on neck as well if she gets home and he has already removed bandage. She will call if further questions.

## 2017-10-13 NOTE — Discharge Summary (Signed)
Physician Discharge Summary      Patient ID: Joel Galloway MRN: [id number] DOB/AGE: [birth year] 57 y.o.  Admit date: 10/09/2017 Discharge date: 10/10/2017  Admission Diagnoses:  Active Problems:   Pseudarthrosis after fusion or arthrodesis   Other spondylosis with radiculopathy, cervical region   S/P cervical spinal fusion   Discharge Diagnoses:  Same  Past Medical History:  Diagnosis Date  . Anxiety   . Arthritis   . Asthma   . Cancer ([place])    skin   . Chronic kidney disease    AA      . Claustrophobia   . Complication of anesthesia    pt states he is unable to tol. foleys,due to anatomy  . Coronary artery disease   . Headache    pt reports migraines  . Hypoglycemia    las t episode 12/24/12  . Multiple sclerosis ([place])   . 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 Seasonal allergies   . Seizures ([place])   . Sleep apnea    Pt reports "mild sleep apnea"  no cpap  . Vision abnormalities     Surgeries: Procedure(s): POSTERIOR CERVICAL FUSION C5-6 AND C6-7 WITH POSTERIOR LATERAL MASS SCREWS AND RODS C5-7, POSTERIOR LEFT C6-7 FORAMINOTOMY, POSTERIOR LEFT ILIAC CREST BONE GRAFT HARVEST on 10/09/2017   Consultants:   Discharged Condition: Improved  Hospital Course: Joel Galloway is an 57 y.o. male who was admitted 10/09/2017 with a chief complaint of No chief complaint on file. , and found to have a diagnosis of <principal problem not specified>.  He was brought to the operating room on 10/09/2017 and underwent the above named procedures.    He was given perioperative antibiotics:  Anti-infectives (From admission, onward)   Start     Dose/Rate Route Frequency Ordered Stop   10/09/17 2300  ceFAZolin (ANCEF) IVPB 2g/100 mL premix     2 g 200 mL/hr over 30 Minutes Intravenous Every 8 hours 10/09/17 2050 10/10/17 0556   10/09/17 1130  ceFAZolin (ANCEF) IVPB 2g/100 mL premix     2 g 200 mL/hr over 30 Minutes Intravenous On call to O.R. 10/09/17 1128 10/09/17 1837   10/09/17 1130  ceFAZolin (ANCEF) 2-4 GM/100ML-% IVPB    Note to Pharmacy:  Leandrew Koyanagi   : cabinet override      10/09/17 1130 10/09/17 1450    He recovered in the PACU and was transferred to [facility] Where he recovered, foley catheter was used intraoperatively and discontinued at the end of the OR case.  He was able to void without difficulty and was able to stand and ambulate. A soft cervical collar was used initially. POD#1 he was awake, alert and oriented x 4. He was standing and walking. Dressing left posterior iliac crest and midline cervical spine was changed. He was afebrile with normal vital signs. He complained of the soft c-collar allowing movement and he was placed into a [employer] c-collar and the collar adjusted with OT assistance. He as discharged home on POD#1.   He was given sequential compression devices and early ambulation for DVT prophylaxis.  He was benefited maximally from their hospital stay and there were no complications.    Recent vital signs:  Vitals:   10/10/17 0503 10/10/17 0719  BP:  111/72  Pulse: 95 90  Resp:  18  Temp:  98.6 F (37 C)  SpO2:  100%    Recent laboratory studies:  Results for orders placed or performed during  the hospital encounter of 10/09/17  CBC  Result Value Ref Range   WBC 18.4 (H) 4.0 - 10.5 K/uL   RBC 3.96 (L) 4.22 - 5.81 MIL/uL   Hemoglobin 12.3 (L) 13.0 - 17.0 g/dL   HCT 35.6 (L) 39.0 - 52.0 %   MCV 89.9 78.0 - 100.0 fL   MCH 31.1 26.0 - 34.0 pg   MCHC 34.6 30.0 - 36.0 g/dL   RDW 13.0 11.5 - 15.5 %   Platelets 324 150 - 400 K/uL  Basic Metabolic Panel  Result Value Ref Range   Sodium 138 135 - 145 mmol/L   Potassium 4.0 3.5 - 5.1 mmol/L   Chloride 104 101 - 111 mmol/L   CO2 25 22 - 32 mmol/L   Glucose, Bld 151 (H) 65 - 99 mg/dL   BUN 12 6 - 20 mg/dL   Creatinine, Ser 0.98 0.61 - 1.24 mg/dL   Calcium 8.6 (L) 8.9 - 10.3 mg/dL   GFR calc non Af Amer >60 >60 mL/min   GFR calc Af Amer >60 >60 mL/min   Anion gap 9 5 -  15    Discharge Medications:   Allergies as of 10/10/2017      Reactions   Antihistamines, Chlorpheniramine-type Other (See Comments)   Chest tightness   Lyrica [pregabalin] Palpitations   Other Anaphylaxis   nuts   Peanut Oil Anaphylaxis, Nausea Only   All NUTS   Fluticasone Nausea Only, Other (See Comments)   MIGRAINES    Neosporin [neomycin-bacitracin Zn-polymyx] Swelling   Aspirin Other (See Comments)   High doses causes toxicity    Benadryl [diphenhydramine] Other (See Comments)   Chest tightness   Benztropine Other (See Comments)   Unknown   Tape Dermatitis   rash rash rash   Chlorhexidine Rash   CHG soap was ok but the wipes caused a rash and burning   Gabapentin Palpitations   Propranolol Nausea Only   Chest tightness      Medication List    STOP taking these medications   oxyCODONE-acetaminophen 10-325 MG tablet Commonly known as:  PERCOCET     TAKE these medications   acetaminophen 500 MG tablet Commonly known as:  TYLENOL Take 1,000 mg by mouth every 6 (six) hours as needed for moderate pain.   albuterol 108 (90 Base) MCG/ACT inhaler Commonly known as:  PROVENTIL HFA;VENTOLIN HFA Inhale 2 puffs into the lungs every 6 (six) hours as needed for wheezing or shortness of breath.   bisoprolol 10 MG tablet Commonly known as:  ZEBETA Take 10 mg by mouth daily.   Cholecalciferol 1000 units capsule Take 1,000 Units by mouth daily.   clonazePAM 1 MG tablet Commonly known as:  KLONOPIN Take 1 mg by mouth 2 (two) times daily.   Diclofenac Sodium 3 % Gel Place 4 g onto the skin 4 (four) times daily as needed.   docusate sodium 100 MG capsule Commonly known as:  COLACE Take 1 capsule (100 mg total) by mouth 2 (two) times daily.   LORazepam 0.5 MG tablet Commonly known as:  ATIVAN Take 1 tablet (0.5 mg total) by mouth every 4 (four) hours as needed for up to 7 days for anxiety.   meloxicam 15 MG tablet Commonly known as:  MOBIC TAKE 1 TABLET BY  MOUTH EVERY DAY   metoprolol tartrate 100 MG tablet Commonly known as:  LOPRESSOR Take 100 mg by mouth 2 (two) times daily as needed (palpitations).   naloxone 4 MG/0.1ML Liqd nasal spray  kit Commonly known as:  NARCAN Spray into nostril for opiod overdose to reverse sedation.   omeprazole 40 MG capsule Commonly known as:  PRILOSEC Take 40 mg by mouth daily.   Oxycodone HCl 10 MG Tabs Take 1 tablet (10 mg total) by mouth every 3 (three) hours as needed for up to 7 days for severe pain ((score 7 to 10)).   oxyCODONE 15 mg 12 hr tablet Commonly known as:  OXYCONTIN Take 1 tablet (15 mg total) by mouth every 12 (twelve) hours for 7 days.   SALINE SENSITIVE EYES Soln 1 drop by Does not apply route daily as needed (dry eyes).   testosterone cypionate 200 MG/ML injection Commonly known as:  DEPOTESTOSTERONE CYPIONATE Inject 200 mg into the muscle every 14 (fourteen) days.       Diagnostic Studies: Dg Cervical Spine 2-3 Views  Result Date: 10/09/2017 CLINICAL DATA:  Posterior cervical fusion EXAM: CERVICAL SPINE - 2-3 VIEW; DG C-ARM 61-120 MIN COMPARISON:  CT 09/08/2017 FINDINGS: Total fluoroscopy time was 26 seconds. Six low resolution intraoperative spot views are submitted. On prior studies, patient has anterior plate and screw fixation C5 through C7 with C6 corpectomy. Intraoperative views demonstrate placement of posterior rods and fixating screws C5 through C7 with limited visibility at C7. IMPRESSION: Intraoperative fluoroscopic assistance during cervical spine surgery. Electronically Signed   By: Donavan Foil M.D.   On: 10/09/2017 20:12   Dg C-arm 1-60 Min  Result Date: 10/09/2017 CLINICAL DATA:  Posterior cervical fusion EXAM: CERVICAL SPINE - 2-3 VIEW; DG C-ARM 61-120 MIN COMPARISON:  CT 09/08/2017 FINDINGS: Total fluoroscopy time was 26 seconds. Six low resolution intraoperative spot views are submitted. On prior studies, patient has anterior plate and screw fixation C5  through C7 with C6 corpectomy. Intraoperative views demonstrate placement of posterior rods and fixating screws C5 through C7 with limited visibility at C7. IMPRESSION: Intraoperative fluoroscopic assistance during cervical spine surgery. Electronically Signed   By: Donavan Foil M.D.   On: 10/09/2017 20:12   Dg C-arm 1-60 Min  Result Date: 10/09/2017 CLINICAL DATA:  Posterior cervical fusion EXAM: CERVICAL SPINE - 2-3 VIEW; DG C-ARM 61-120 MIN COMPARISON:  CT 09/08/2017 FINDINGS: Total fluoroscopy time was 26 seconds. Six low resolution intraoperative spot views are submitted. On prior studies, patient has anterior plate and screw fixation C5 through C7 with C6 corpectomy. Intraoperative views demonstrate placement of posterior rods and fixating screws C5 through C7 with limited visibility at C7. IMPRESSION: Intraoperative fluoroscopic assistance during cervical spine surgery. Electronically Signed   By: Donavan Foil M.D.   On: 10/09/2017 20:12    Disposition:   Discharge Instructions    Call MD / Call 911   Complete by:  As directed    If you experience chest pain or shortness of breath, CALL 911 and be transported to the hospital emergency room.  If you develope a fever above 101 F, pus (white drainage) or increased drainage or redness at the wound, or calf pain, call your surgeon's office.   Constipation Prevention   Complete by:  As directed    Drink plenty of fluids.  Prune juice may be helpful.  You may use a stool softener, such as Colace (over the counter) 100 mg twice a day.  Use MiraLax (over the counter) for constipation as needed.   Diet - low sodium heart healthy   Complete by:  As directed    Discharge instructions   Complete by:  As directed    No  lifting greater than 10 lbs. Avoid bending, stooping and twisting. Walking in house for first week then may start to get out slowly increasing activity using arms. Keep incision neck and left posterior iliac crest dry for 3 days, may  use tegaderm or similar water impervious dressing. Avoid overhead use of arms and overhead lifting. Wear collar for comfort. Use ice as needed for comfort.   Driving restrictions   Complete by:  As directed    No driving for 6 weeks   Increase activity slowly as tolerated   Complete by:  As directed    Lifting restrictions   Complete by:  As directed    No lifting for 8 weeks      Follow-up Information    Jessy Oto, MD In 2 weeks.   Specialty:  Orthopedic Surgery Why:  For wound re-check Contact information: [place] [address] [phone]            Signed: Maximiliano Cromartie 10/13/2017, 12:00 PM

## 2017-10-16 ENCOUNTER — Ambulatory Visit (INDEPENDENT_AMBULATORY_CARE_PROVIDER_SITE_OTHER): Payer: Medicare Other

## 2017-10-16 ENCOUNTER — Encounter (INDEPENDENT_AMBULATORY_CARE_PROVIDER_SITE_OTHER): Payer: Self-pay | Admitting: Specialist

## 2017-10-16 ENCOUNTER — Ambulatory Visit (INDEPENDENT_AMBULATORY_CARE_PROVIDER_SITE_OTHER): Payer: 59 | Admitting: Specialist

## 2017-10-16 VITALS — BP 125/74 | HR 78 | Ht 72.0 in | Wt 187.0 lb

## 2017-10-16 DIAGNOSIS — M5412 Radiculopathy, cervical region: Secondary | ICD-10-CM

## 2017-10-16 MED ORDER — OXYCODONE HCL 10 MG PO TABS: 10.0000 mg | ORAL_TABLET | ORAL | 0 refills | Status: DC | PRN

## 2017-10-16 NOTE — Patient Instructions (Signed)
Plan:Avoid overhead lifting and overhead use of the arms. Do not lift greater than 5 lbs. Adjust head rest in vehicle to prevent hyperextension if rear ended. Take extra precautions to avoid falling. Continue medications for pain and muscle relaxers.

## 2017-10-16 NOTE — Progress Notes (Signed)
Post-Op Visit Note   Patient: Joel Galloway           Date of Birth: [birth year]           MRN: [id number] Visit Date: 10/16/2017 PCP: [name], MD   Assessment & Plan:One week post C5-7 posterior fusion with lateral mass instrumentation and left C6-7 foramenotomy.  Chief Complaint:  Chief Complaint  Patient presents with  . Neck - Routine Post Op, Wound Check   Visit Diagnoses:  1. Cervical radiculopathy at C6   Incision is well healed Minimal erythrema superior incision. Dermabond removed and steristrips applied no  Drainage.  UE motor is normal   Plan:Avoid overhead lifting and overhead use of the arms. Do not lift greater than 5 lbs. Adjust head rest in vehicle to prevent hyperextension if rear ended. Take extra precautions to avoid falling. Continue medications for pain and muscle relaxers.  Follow-Up Instructions: No follow-ups on file.   Orders:  Orders Placed This Encounter  Procedures  . XR Cervical Spine 2 or 3 views   No orders of the defined types were placed in this encounter.   Imaging: Xr Cervical Spine 2 Or 3 Views  Result Date: 10/16/2017 Ap and lateral radiographs of the cervical spine show anterior cervical plate and screws fixing C5 to C7 with a bone plug anteriorly C5 to C7, there is fixation posteriorly with lateral mass screws and rods extending from C5 to C7 in good position and alignment. Presence of posterior fusion material   PMFS History: Patient Active Problem List   Diagnosis Date Noted  . 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 Other spondylosis with radiculopathy, cervical region 10/09/2017    Priority: High    Class: Chronic  . Lumbar degenerative disc disease 01/12/2013    Priority: High    Class: Chronic  . HNP (herniated nucleus pulposus), lumbar 01/12/2013    Priority: High    Class: Chronic  . Spinal stenosis, lumbar region, with neurogenic claudication  01/12/2013    Priority: High    Class: Chronic  . S/P cervical spinal fusion 01/20/2017  . Memory difficulties 10/14/2016  . Tachycardia 07/12/2016  . Chest pain 07/12/2016  . Polysubstance abuse ([place]) 07/12/2016  . SIRS (systemic inflammatory response syndrome) ([place]) 07/12/2016  . Lactic acidosis 07/12/2016  . Anxiety state 07/12/2016  . Abnormal brain MRI 07/08/2016  . Chronic pain syndrome 07/08/2016  . Chronic migraine w/o aura w/o status migrainosus, not intractable 07/08/2016  . History of optic neuritis 06/02/2016  . Numbness 06/02/2016  . Gait disturbance 06/02/2016  . Urinary frequency 06/02/2016  . Other fatigue 06/02/2016  . Leg cramp 06/02/2016  . Pain in right hand 05/26/2016  . Chronic bilateral low back pain 05/26/2016  . Closed fracture of body of sternum 05/26/2016  . Nail bed injury right middle finger 07/13/2014   Past Medical History:  Diagnosis Date  . Anxiety   . Arthritis   . Asthma   . Cancer ([place])    skin   . Chronic kidney disease    AA      . Claustrophobia   . Complication of anesthesia    pt states he is unable to tol. foleys,due to anatomy  . Coronary artery disease   . Headache    pt reports migraines  . Hypoglycemia    las t episode 12/24/12  . Multiple sclerosis ([place])   . Poor short term  memory    due to [place]  . Seasonal allergies   . Seizures ([place])   . Sleep apnea    Pt reports "mild sleep apnea"  no cpap  . Vision abnormalities     Family History  Problem Relation Age of Onset  . Hypertension Mother   . Obesity Mother   . Diabetes Mother   . Anxiety disorder Mother   . Ataxia Neg Hx   . Chorea Neg Hx   . Dementia Neg Hx   . Mental retardation Neg Hx   . Migraines Neg Hx   . Multiple sclerosis Neg Hx   . Neurofibromatosis Neg Hx   . Neuropathy Neg Hx   . Parkinsonism Neg Hx   . Seizures Neg Hx   . Stroke Neg Hx     Past Surgical History:  Procedure Laterality Date  . ANTERIOR CERVICAL DECOMP/DISCECTOMY FUSION N/A  01/20/2017   Procedure: C6 CORPECTOMY WITH BILATERAL FORAMINOTOMIES C5-6 AND C6-7, FUSION C5-7 ANTERIORLY WITH STRUT ALLOGRAFT AND LOCAL BONE GRAFT, ANTERIOR CERVICAL PLATE AND SCREWS;  Surgeon: Jessy Oto, MD;  Location: [facility];  Service: Orthopedics;  Laterality: N/A;  . BACK SURGERY    . HEMATOMA EVACUATION Right 07/13/2014   Procedure: EVACUATION HEMATOMA RIGHT MIDDLE FINGER;  Surgeon: Mcarthur Rossetti, MD;  Location: [place] [facility];  Service: Orthopedics;  Laterality: Right;  . KNEE SURGERY Left    x2  arthroscopy  . NOSE SURGERY    . POSTERIOR FUSION LUMBAR SPINE  01/10/2013   Dr Louanne Skye  . WISDOM TOOTH EXTRACTION     Social History   Occupational History  . Not on file  Tobacco Use  . Smoking status: Never Smoker  . Smokeless tobacco: Never Used  Substance and Sexual Activity  . Alcohol use: No  . Drug use: No  . Sexual activity: Not on file

## 2017-10-20 ENCOUNTER — Encounter (HOSPITAL_COMMUNITY): Payer: Self-pay | Admitting: Specialist

## 2017-10-22 ENCOUNTER — Other Ambulatory Visit (INDEPENDENT_AMBULATORY_CARE_PROVIDER_SITE_OTHER): Payer: Self-pay | Admitting: Specialist

## 2017-10-22 NOTE — Telephone Encounter (Signed)
Patient called needing Rx refilled (Oxycodone) The number to contact patient is [phone]

## 2017-10-23 NOTE — Telephone Encounter (Signed)
ERROR

## 2017-10-30 ENCOUNTER — Other Ambulatory Visit (INDEPENDENT_AMBULATORY_CARE_PROVIDER_SITE_OTHER): Payer: Self-pay | Admitting: Specialist

## 2017-10-30 ENCOUNTER — Encounter (INDEPENDENT_AMBULATORY_CARE_PROVIDER_SITE_OTHER): Payer: Self-pay | Admitting: Specialist

## 2017-10-30 ENCOUNTER — Ambulatory Visit (INDEPENDENT_AMBULATORY_CARE_PROVIDER_SITE_OTHER): Payer: 59 | Admitting: Specialist

## 2017-10-30 ENCOUNTER — Ambulatory Visit (INDEPENDENT_AMBULATORY_CARE_PROVIDER_SITE_OTHER): Payer: Medicare Other

## 2017-10-30 VITALS — BP 118/71 | HR 68 | Ht 72.0 in | Wt 195.0 lb

## 2017-10-30 DIAGNOSIS — M4322 Fusion of spine, cervical region: Secondary | ICD-10-CM

## 2017-10-30 DIAGNOSIS — M542 Cervicalgia: Secondary | ICD-10-CM

## 2017-10-30 DIAGNOSIS — M7581 Other shoulder lesions, right shoulder: Secondary | ICD-10-CM

## 2017-10-30 DIAGNOSIS — M778 Other enthesopathies, not elsewhere classified: Secondary | ICD-10-CM

## 2017-10-30 MED ORDER — BUPIVACAINE HCL 0.25 % IJ SOLN
4.0000 mL | INTRAMUSCULAR | Status: AC | PRN
  Administered 2017-10-30: 4 mL via INTRA_ARTICULAR

## 2017-10-30 MED ORDER — OXYCODONE HCL 15 MG PO TABS: 15.0000 mg | ORAL_TABLET | ORAL | 0 refills | Status: DC | PRN

## 2017-10-30 MED ORDER — METHYLPREDNISOLONE ACETATE 40 MG/ML IJ SUSP
40.0000 mg | INTRAMUSCULAR | Status: AC | PRN
  Administered 2017-10-30: 40 mg via INTRA_ARTICULAR

## 2017-10-30 MED ORDER — TIZANIDINE HCL 4 MG PO TABS: 4.0000 mg | ORAL_TABLET | Freq: Four times a day (QID) | ORAL | 0 refills | Status: DC | PRN

## 2017-10-30 NOTE — Patient Instructions (Signed)
Avoid overhead lifting and overhead use of the arms. Do not lift greater than 5 lbs. Adjust head rest in vehicle to prevent hyperextension if rear ended. Take extra precautions to avoid falling. May shower and use ice locally. Avoid overhead lifting and overhead use of the arms. Do not lift greater than 10 lbs. Tylenol ES one every 6-8 hours for pain and inflamation. Home exercise program.

## 2017-10-30 NOTE — Progress Notes (Signed)
Post-Op Visit Note   Patient: Joel Galloway           Date of Birth: 1961-03-25           MRN: [id number] Visit Date: 10/30/2017 PCP: [name], MD   Assessment & Plan: 3 weeks post posterior fusion C5-7  Chief Complaint:  Chief Complaint  Patient presents with  . Neck - Routine Post Op  Pain at the incision and over the paracervical muscles bilaterally.  UE motor nl Complains of right shoulder pain. Pain like previous right posterior lateral scapula.   Visit Diagnoses:  1. Neck pain   2. Fusion of spine of cervical region   3. Shoulder tendonitis, right   Right shoulder and left with mild pain.  Arms are NV normal. Radiographs show no significant change from 2 weeksa go. Right shouder impingement sign, good strength, pain with IR over right posterior later infraspinatous.   Procedure Note  Patient: Joel Galloway             Date of Birth: 07/31/1960           MRN: [id number]             Visit Date: 10/30/2017  Procedures: Visit Diagnoses: Neck pain - Plan: XR Cervical Spine 2 or 3 views, Large Joint Inj: R subacromial bursa  Fusion of spine of cervical region - Plan: Large Joint Inj: R subacromial bursa  Shoulder tendonitis, right - Plan: Large Joint Inj: R subacromial bursa  Large Joint Inj: R subacromial bursa on 10/30/2017 9:39 AM Indications: pain Details: 25 G 1.5 in needle, anterolateral approach  Arthrogram: No  Medications: 40 mg methylPREDNISolone acetate 40 MG/ML; 4 mL bupivacaine 0.25 % Outcome: tolerated well, no immediate complications  Right shoulder bandaid applied. Procedure, treatment alternatives, risks and benefits explained, specific risks discussed. Consent was given by the patient. Immediately prior to procedure a time out was called to verify the correct patient, procedure, equipment, support staff and site/side marked as required. Patient was prepped and draped in the usual sterile fashion.       Plan: Avoid overhead lifting  and overhead use of the arms. Do not lift greater than 5 lbs. Adjust head rest in vehicle to prevent hyperextension if rear ended. Take extra precautions to avoid falling. May shower and use ice locally.  Follow-Up Instructions: Return in about 1 month (around 11/27/2017).   Orders:  Orders Placed This Encounter  Procedures  . Large Joint Inj: R subacromial bursa  . XR Cervical Spine 2 or 3 views   Meds ordered this encounter  Medications  . oxyCODONE (ROXICODONE) 15 MG immediate release tablet    Sig: Take 1 tablet (15 mg total) by mouth every 4 (four) hours as needed for pain.    Dispense:  30 tablet    Refill:  0  . DISCONTD: tiZANidine (ZANAFLEX) 4 MG tablet    Sig: Take 1 tablet (4 mg total) by mouth every 6 (six) hours as needed for muscle spasms.    Dispense:  30 tablet    Refill:  0    Imaging: Xr Cervical Spine 2 Or 3 Views  Result Date: 10/30/2017 AP and lateral radiographs of the cervical spine with the presence of lateral mass screws and rods C5 to C7 the aligment of the fusion site is normal there is mild kyphosis at the segment above the fusion site. Anterior plate and screws C5 to C7 good postion and alignment, strut graft for  corpectomy C6 without abnormality.    PMFS History: Patient Active Problem List   Diagnosis Date Noted  . 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 Other spondylosis with radiculopathy, cervical region 10/09/2017    Priority: High    Class: Chronic  . Lumbar degenerative disc disease 01/12/2013    Priority: High    Class: Chronic  . HNP (herniated nucleus pulposus), lumbar 01/12/2013    Priority: High    Class: Chronic  . Spinal stenosis, lumbar region, with neurogenic claudication 01/12/2013    Priority: High    Class: Chronic  . S/P cervical spinal fusion 01/20/2017  . Memory difficulties 10/14/2016  . Tachycardia 07/12/2016  . Chest pain 07/12/2016  . Polysubstance abuse  ([place]) 07/12/2016  . SIRS (systemic inflammatory response syndrome) ([place]) 07/12/2016  . Lactic acidosis 07/12/2016  . Anxiety state 07/12/2016  . Abnormal brain MRI 07/08/2016  . Chronic pain syndrome 07/08/2016  . Chronic migraine w/o aura w/o status migrainosus, not intractable 07/08/2016  . History of optic neuritis 06/02/2016  . Numbness 06/02/2016  . Gait disturbance 06/02/2016  . Urinary frequency 06/02/2016  . Other fatigue 06/02/2016  . Leg cramp 06/02/2016  . Pain in right hand 05/26/2016  . Chronic bilateral low back pain 05/26/2016  . Closed fracture of body of sternum 05/26/2016  . Nail bed injury right middle finger 07/13/2014   Past Medical History:  Diagnosis Date  . Anxiety   . Arthritis   . Asthma   . Cancer ([place])    skin   . Chronic kidney disease    AA      . Claustrophobia   . Complication of anesthesia    pt states he is unable to tol. foleys,due to anatomy  . Coronary artery disease   . Headache    pt reports migraines  . Hypoglycemia    las t episode 12/24/12  . Multiple sclerosis ([place])   . 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 Seasonal allergies   . Seizures ([place])   . Sleep apnea    Pt reports "mild sleep apnea"  no cpap  . Vision abnormalities     Family History  Problem Relation Age of Onset  . Hypertension Mother   . Obesity Mother   . Diabetes Mother   . Anxiety disorder Mother   . Ataxia Neg Hx   . Chorea Neg Hx   . Dementia Neg Hx   . Mental retardation Neg Hx   . Migraines Neg Hx   . Multiple sclerosis Neg Hx   . Neurofibromatosis Neg Hx   . Neuropathy Neg Hx   . Parkinsonism Neg Hx   . Seizures Neg Hx   . Stroke Neg Hx     Past Surgical History:  Procedure Laterality Date  . ANTERIOR CERVICAL DECOMP/DISCECTOMY FUSION N/A 01/20/2017   Procedure: C6 CORPECTOMY WITH BILATERAL FORAMINOTOMIES C5-6 AND C6-7, FUSION C5-7 ANTERIORLY WITH STRUT ALLOGRAFT AND LOCAL BONE GRAFT, ANTERIOR CERVICAL PLATE AND SCREWS;  Surgeon: Jessy Oto, MD;  Location: [facility];  Service: Orthopedics;  Laterality: N/A;  . BACK SURGERY    . HEMATOMA EVACUATION Right 07/13/2014   Procedure: EVACUATION HEMATOMA RIGHT MIDDLE FINGER;  Surgeon: Mcarthur Rossetti, MD;  Location: [facility];  Service: Orthopedics;  Laterality: Right;  . KNEE SURGERY Left    x2  arthroscopy  . NOSE SURGERY    . POSTERIOR CERVICAL  FUSION/FORAMINOTOMY N/A 10/09/2017   Procedure: POSTERIOR CERVICAL FUSION C5-6 AND C6-7 WITH POSTERIOR LATERAL MASS SCREWS AND RODS C5-7, POSTERIOR LEFT C6-7 FORAMINOTOMY, POSTERIOR LEFT ILIAC CREST BONE GRAFT HARVEST;  Surgeon: Jessy Oto, MD;  Location: [facility];  Service: Orthopedics;  Laterality: N/A;  . POSTERIOR FUSION LUMBAR SPINE  01/10/2013   Dr Louanne Skye  . WISDOM TOOTH EXTRACTION     Social History   Occupational History  . Not on file  Tobacco Use  . Smoking status: Never Smoker  . Smokeless tobacco: Never Used  Substance and Sexual Activity  . Alcohol use: No  . Drug use: No  . Sexual activity: Not on file

## 2017-10-30 NOTE — Telephone Encounter (Signed)
tizanidine refill request 

## 2017-11-06 ENCOUNTER — Telehealth (INDEPENDENT_AMBULATORY_CARE_PROVIDER_SITE_OTHER): Payer: Self-pay | Admitting: Specialist

## 2017-11-06 NOTE — Telephone Encounter (Signed)
Patient called needing a refill on pain medicine. The number to contact patient is [phone]

## 2017-11-11 ENCOUNTER — Other Ambulatory Visit (INDEPENDENT_AMBULATORY_CARE_PROVIDER_SITE_OTHER): Payer: Self-pay | Admitting: Specialist

## 2017-11-11 MED ORDER — OXYCODONE HCL 15 MG PO TABS: 15.0000 mg | ORAL_TABLET | Freq: Three times a day (TID) | ORAL | 0 refills | Status: DC | PRN

## 2017-11-11 NOTE — Telephone Encounter (Signed)
Called patient and informed him Rx for pain is ready at front desk for pickup.

## 2017-11-13 ENCOUNTER — Ambulatory Visit (INDEPENDENT_AMBULATORY_CARE_PROVIDER_SITE_OTHER): Payer: Medicare Other | Admitting: Specialist

## 2017-11-27 ENCOUNTER — Ambulatory Visit (INDEPENDENT_AMBULATORY_CARE_PROVIDER_SITE_OTHER): Payer: 59

## 2017-11-27 ENCOUNTER — Ambulatory Visit (INDEPENDENT_AMBULATORY_CARE_PROVIDER_SITE_OTHER): Payer: 59 | Admitting: Specialist

## 2017-11-27 ENCOUNTER — Encounter (INDEPENDENT_AMBULATORY_CARE_PROVIDER_SITE_OTHER): Payer: Self-pay | Admitting: Specialist

## 2017-11-27 VITALS — BP 123/79 | HR 65 | Ht 72.0 in | Wt 195.0 lb

## 2017-11-27 DIAGNOSIS — M4322 Fusion of spine, cervical region: Secondary | ICD-10-CM

## 2017-11-27 MED ORDER — CEPHALEXIN 500 MG PO CAPS: 500.0000 mg | ORAL_CAPSULE | Freq: Four times a day (QID) | ORAL | 0 refills | Status: DC

## 2017-11-27 MED ORDER — MUPIROCIN 2 % EX OINT: 1.0000 "application " | TOPICAL_OINTMENT | Freq: Two times a day (BID) | CUTANEOUS | 0 refills | Status: DC

## 2017-11-27 MED ORDER — OXYCODONE HCL 15 MG PO TABS: 15.0000 mg | ORAL_TABLET | Freq: Three times a day (TID) | ORAL | 0 refills | Status: DC | PRN

## 2017-11-27 MED ORDER — TIZANIDINE HCL 4 MG PO TABS: ORAL_TABLET | ORAL | 0 refills | Status: DC

## 2017-11-27 NOTE — Patient Instructions (Addendum)
  Plan:Avoid overhead lifting and overhead use of the arms. Do not lift greater than 5 lbs. Adjust head rest in vehicle to prevent hyperextension if rear ended. Take extra precautions to avoid falling.  Avoid frequent bending and stooping  No lifting greater than 10 lbs. May use ice or moist heat for pain. Weight loss is of benefit. Handicap license is approved. Mupurocin cream applied twice a day with bandaid. Keflex 500 mg po every 6 hours till out. Narcotics renewed.

## 2017-11-27 NOTE — Progress Notes (Signed)
Post-Op Visit Note   Patient: Joel Galloway           Date of Birth: 09-13-1960           MRN: [id number] Visit Date: 11/27/2017 PCP: [name], MD   Assessment & Plan: 6 weeks post posterior fusion and instrumentation C5-7 small area of skin suture abscess.  Chief Complaint:  Chief Complaint  Patient presents with  . Neck - Routine Post Op  Right mid distal 1/3 of posterior neck incision is showing  Motor intact/.  Visit Diagnoses:  1. Fusion of spine of cervical region     Plan:Avoid overhead lifting and overhead use of the arms. Do not lift greater than 5 lbs. Adjust head rest in vehicle to prevent hyperextension if rear ended. Take extra precautions to avoid falling.  Avoid frequent bending and stooping  No lifting greater than 10 lbs. May use ice or moist heat for pain. Weight loss is of benefit. Handicap license is approved. Mupurocin cream applied twice a day with bandaid. Keflex 500 mg po every 6 hours till out. Narcotics renewed.   Follow-Up Instructions: Return in about 1 week (around 12/04/2017), or overbook appt next Friday AM for incision exam. , for Keep appointment scheduled for 01/08/2018.   Orders:  Orders Placed This Encounter  Procedures  . XR Cervical Spine 2 or 3 views   Meds ordered this encounter  Medications  . cephALEXin (KEFLEX) 500 MG capsule    Sig: Take 1 capsule (500 mg total) by mouth 4 (four) times daily.    Dispense:  28 capsule    Refill:  0  . oxyCODONE (ROXICODONE) 15 MG immediate release tablet    Sig: Take 1 tablet (15 mg total) by mouth every 8 (eight) hours as needed for pain.    Dispense:  30 tablet    Refill:  0  . tiZANidine (ZANAFLEX) 4 MG tablet    Sig: TAKE 1 TABLET BY MOUTH EVERY 6 HOURS AS NEEDED FOR MUSCLE SPASMS    Dispense:  385 tablet    Refill:  0    **Patient requests 90 days supply**  . mupirocin ointment (BACTROBAN) 2 %    Sig: Apply 1 application topically 2 (two) times daily.    Dispense:   22 g    Refill:  0    Imaging: No results found.  PMFS History: Patient Active Problem List   Diagnosis Date Noted  . 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 Other spondylosis with radiculopathy, cervical region 10/09/2017    Priority: High    Class: Chronic  . Lumbar degenerative disc disease 01/12/2013    Priority: High    Class: Chronic  . HNP (herniated nucleus pulposus), lumbar 01/12/2013    Priority: High    Class: Chronic  . Spinal stenosis, lumbar region, with neurogenic claudication 01/12/2013    Priority: High    Class: Chronic  . S/P cervical spinal fusion 01/20/2017  . Memory difficulties 10/14/2016  . Tachycardia 07/12/2016  . Chest pain 07/12/2016  . Polysubstance abuse ([place]) 07/12/2016  . SIRS (systemic inflammatory response syndrome) ([place]) 07/12/2016  . Lactic acidosis 07/12/2016  . Anxiety state 07/12/2016  . Abnormal brain MRI 07/08/2016  . Chronic pain syndrome 07/08/2016  . Chronic migraine w/o aura w/o status migrainosus, not intractable 07/08/2016  . History of optic neuritis 06/02/2016  . Numbness 06/02/2016  . Gait disturbance 06/02/2016  .  Urinary frequency 06/02/2016  . Other fatigue 06/02/2016  . Leg cramp 06/02/2016  . Pain in right hand 05/26/2016  . Chronic bilateral low back pain 05/26/2016  . Closed fracture of body of sternum 05/26/2016  . Nail bed injury right middle finger 07/13/2014   Past Medical History:  Diagnosis Date  . Anxiety   . Arthritis   . Asthma   . Cancer ([place])    skin   . Chronic kidney disease    AA      . Claustrophobia   . Complication of anesthesia    pt states he is unable to tol. foleys,due to anatomy  . Coronary artery disease   . Headache    pt reports migraines  . Hypoglycemia    las t episode 12/24/12  . Multiple sclerosis ([place])   . 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 Seasonal allergies   . Seizures ([place])   . Sleep apnea    Pt reports  "mild sleep apnea"  no cpap  . Vision abnormalities     Family History  Problem Relation Age of Onset  . Hypertension Mother   . Obesity Mother   . Diabetes Mother   . Anxiety disorder Mother   . Ataxia Neg Hx   . Chorea Neg Hx   . Dementia Neg Hx   . Mental retardation Neg Hx   . Migraines Neg Hx   . Multiple sclerosis Neg Hx   . Neurofibromatosis Neg Hx   . Neuropathy Neg Hx   . Parkinsonism Neg Hx   . Seizures Neg Hx   . Stroke Neg Hx     Past Surgical History:  Procedure Laterality Date  . ANTERIOR CERVICAL DECOMP/DISCECTOMY FUSION N/A 01/20/2017   Procedure: C6 CORPECTOMY WITH BILATERAL FORAMINOTOMIES C5-6 AND C6-7, FUSION C5-7 ANTERIORLY WITH STRUT ALLOGRAFT AND LOCAL BONE GRAFT, ANTERIOR CERVICAL PLATE AND SCREWS;  Surgeon: Jessy Oto, MD;  Location: [facility];  Service: Orthopedics;  Laterality: N/A;  . BACK SURGERY    . HEMATOMA EVACUATION Right 07/13/2014   Procedure: EVACUATION HEMATOMA RIGHT MIDDLE FINGER;  Surgeon: Mcarthur Rossetti, MD;  Location: [facility];  Service: Orthopedics;  Laterality: Right;  . KNEE SURGERY Left    x2  arthroscopy  . NOSE SURGERY    . POSTERIOR CERVICAL FUSION/FORAMINOTOMY N/A 10/09/2017   Procedure: POSTERIOR CERVICAL FUSION C5-6 AND C6-7 WITH POSTERIOR LATERAL MASS SCREWS AND RODS C5-7, POSTERIOR LEFT C6-7 FORAMINOTOMY, POSTERIOR LEFT ILIAC CREST BONE GRAFT HARVEST;  Surgeon: Jessy Oto, MD;  Location: [facility];  Service: Orthopedics;  Laterality: N/A;  . POSTERIOR FUSION LUMBAR SPINE  01/10/2013   Dr Louanne Skye  . WISDOM TOOTH EXTRACTION     Social History   Occupational History  . Not on file  Tobacco Use  . Smoking status: Never Smoker  . Smokeless tobacco: Never Used  Substance and Sexual Activity  . Alcohol use: No  . Drug use: No  . Sexual activity: Not on file

## 2017-12-04 ENCOUNTER — Encounter (INDEPENDENT_AMBULATORY_CARE_PROVIDER_SITE_OTHER): Payer: Self-pay | Admitting: Specialist

## 2017-12-04 ENCOUNTER — Ambulatory Visit (INDEPENDENT_AMBULATORY_CARE_PROVIDER_SITE_OTHER): Payer: Medicare Other | Admitting: Specialist

## 2017-12-04 DIAGNOSIS — Z981 Arthrodesis status: Secondary | ICD-10-CM

## 2017-12-04 MED ORDER — OXYCODONE HCL 15 MG PO TABS: 15.0000 mg | ORAL_TABLET | Freq: Three times a day (TID) | ORAL | 0 refills | Status: DC | PRN

## 2017-12-04 NOTE — Progress Notes (Signed)
Post-Op Visit Note   Patient: Joel Galloway           Date of Birth: 1960-09-07           MRN: [id number] Visit Date: 12/04/2017 PCP: [name], MD   Assessment & Plan:Post op 7 weeks from C5-7 posterior fusion, incision Is healing  Chief Complaint: No chief complaint on file.  Visit Diagnoses:  1. S/P cervical spinal fusion     Plan: Avoid overhead lifting and overhead use of the arms. Do not lift greater than 5 lbs. Adjust head rest in vehicle to prevent hyperextension if rear ended. Take extra precautions to avoid falling.   Follow-Up Instructions: Return if symptoms worsen or fail to improve, for has an appointment in 3 weeks already made..   Orders:  No orders of the defined types were placed in this encounter.  No orders of the defined types were placed in this encounter.   Imaging: No results found.  PMFS History: Patient Active Problem List   Diagnosis Date Noted  . 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 Other spondylosis with radiculopathy, cervical region 10/09/2017    Priority: High    Class: Chronic  . Lumbar degenerative disc disease 01/12/2013    Priority: High    Class: Chronic  . HNP (herniated nucleus pulposus), lumbar 01/12/2013    Priority: High    Class: Chronic  . Spinal stenosis, lumbar region, with neurogenic claudication 01/12/2013    Priority: High    Class: Chronic  . S/P cervical spinal fusion 01/20/2017  . Memory difficulties 10/14/2016  . Tachycardia 07/12/2016  . Chest pain 07/12/2016  . Polysubstance abuse ([place]) 07/12/2016  . SIRS (systemic inflammatory response syndrome) ([place]) 07/12/2016  . Lactic acidosis 07/12/2016  . Anxiety state 07/12/2016  . Abnormal brain MRI 07/08/2016  . Chronic pain syndrome 07/08/2016  . Chronic migraine w/o aura w/o status migrainosus, not intractable 07/08/2016  . History of optic neuritis 06/02/2016  . Numbness 06/02/2016  .  Gait disturbance 06/02/2016  . Urinary frequency 06/02/2016  . Other fatigue 06/02/2016  . Leg cramp 06/02/2016  . Pain in right hand 05/26/2016  . Chronic bilateral low back pain 05/26/2016  . Closed fracture of body of sternum 05/26/2016  . Nail bed injury right middle finger 07/13/2014   Past Medical History:  Diagnosis Date  . Anxiety   . Arthritis   . Asthma   . Cancer ([place])    skin   . Chronic kidney disease    AA      . Claustrophobia   . Complication of anesthesia    pt states he is unable to tol. foleys,due to anatomy  . Coronary artery disease   . Headache    pt reports migraines  . Hypoglycemia    las t episode 12/24/12  . Multiple sclerosis ([place])   . 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 Seasonal allergies   . Seizures ([place])   . Sleep apnea    Pt reports "mild sleep apnea"  no cpap  . Vision abnormalities     Family History  Problem Relation Age of Onset  . Hypertension Mother   . Obesity Mother   . Diabetes Mother   . Anxiety disorder Mother   . Ataxia Neg Hx   . Chorea Neg Hx   . Dementia Neg Hx   . Mental retardation Neg Hx   .  Migraines Neg Hx   . Multiple sclerosis Neg Hx   . Neurofibromatosis Neg Hx   . Neuropathy Neg Hx   . Parkinsonism Neg Hx   . Seizures Neg Hx   . Stroke Neg Hx     Past Surgical History:  Procedure Laterality Date  . ANTERIOR CERVICAL DECOMP/DISCECTOMY FUSION N/A 01/20/2017   Procedure: C6 CORPECTOMY WITH BILATERAL FORAMINOTOMIES C5-6 AND C6-7, FUSION C5-7 ANTERIORLY WITH STRUT ALLOGRAFT AND LOCAL BONE GRAFT, ANTERIOR CERVICAL PLATE AND SCREWS;  Surgeon: Jessy Oto, MD;  Location: [facility];  Service: Orthopedics;  Laterality: N/A;  . BACK SURGERY    . HEMATOMA EVACUATION Right 07/13/2014   Procedure: EVACUATION HEMATOMA RIGHT MIDDLE FINGER;  Surgeon: Mcarthur Rossetti, MD;  Location: [facility];  Service: Orthopedics;  Laterality: Right;  . KNEE SURGERY Left    x2  arthroscopy  . NOSE SURGERY    . POSTERIOR CERVICAL  FUSION/FORAMINOTOMY N/A 10/09/2017   Procedure: POSTERIOR CERVICAL FUSION C5-6 AND C6-7 WITH POSTERIOR LATERAL MASS SCREWS AND RODS C5-7, POSTERIOR LEFT C6-7 FORAMINOTOMY, POSTERIOR LEFT ILIAC CREST BONE GRAFT HARVEST;  Surgeon: Jessy Oto, MD;  Location: [facility];  Service: Orthopedics;  Laterality: N/A;  . POSTERIOR FUSION LUMBAR SPINE  01/10/2013   Dr Louanne Skye  . WISDOM TOOTH EXTRACTION     Social History   Occupational History  . Not on file  Tobacco Use  . Smoking status: Never Smoker  . Smokeless tobacco: Never Used  Substance and Sexual Activity  . Alcohol use: No  . Drug use: No  . Sexual activity: Not on file

## 2017-12-04 NOTE — Patient Instructions (Signed)
Avoid overhead lifting and overhead use of the arms. Do not lift greater than 5 lbs. Adjust head rest in vehicle to prevent hyperextension if rear ended. Take extra precautions to avoid falling.  

## 2017-12-11 ENCOUNTER — Ambulatory Visit (INDEPENDENT_AMBULATORY_CARE_PROVIDER_SITE_OTHER): Payer: Medicare Other | Admitting: Specialist

## 2018-01-08 ENCOUNTER — Ambulatory Visit (INDEPENDENT_AMBULATORY_CARE_PROVIDER_SITE_OTHER): Payer: 59 | Admitting: Specialist

## 2018-01-08 ENCOUNTER — Encounter (INDEPENDENT_AMBULATORY_CARE_PROVIDER_SITE_OTHER): Payer: Self-pay | Admitting: Specialist

## 2018-01-08 ENCOUNTER — Ambulatory Visit (INDEPENDENT_AMBULATORY_CARE_PROVIDER_SITE_OTHER): Payer: 59

## 2018-01-08 VITALS — BP 110/65 | HR 71 | Ht 72.0 in | Wt 195.0 lb

## 2018-01-08 DIAGNOSIS — M25462 Effusion, left knee: Secondary | ICD-10-CM

## 2018-01-08 DIAGNOSIS — M2392 Unspecified internal derangement of left knee: Secondary | ICD-10-CM

## 2018-01-08 DIAGNOSIS — Z981 Arthrodesis status: Secondary | ICD-10-CM

## 2018-01-08 DIAGNOSIS — M25562 Pain in left knee: Secondary | ICD-10-CM

## 2018-01-08 DIAGNOSIS — M1712 Unilateral primary osteoarthritis, left knee: Secondary | ICD-10-CM

## 2018-01-08 DIAGNOSIS — G8929 Other chronic pain: Secondary | ICD-10-CM

## 2018-01-08 NOTE — Progress Notes (Signed)
Post-Op Visit Note   Patient: Joel Galloway           Date of Birth: Jan 03, 1961           MRN: [id number] Visit Date: 01/08/2018 PCP: [name], MD   Assessment & Plan:3 months post C5-C7 posterior instrumentation for non healing corpectomy C6 with anterior fusion with plates.   Chief Complaint:  Chief Complaint  Patient presents with  . Neck - Routine Post Op  Has pain in the left posterior neck in the LS muscle and it is reproducibly tender. Motor is normal in UE. Played some golf last week.  C/o some left knee pain today with swelling and he feels the left knee is popping with standing and walking. We have already injected the left knee 3 times over the past one year. Effusion 2 plus, Ligaments are stable. ROM 1-115 degrees. Plain radiographs from 04/2017 show mild tricompartment osteoarthritis.    Visit Diagnoses:  1. S/P cervical spinal fusion   2. Unilateral primary osteoarthritis, left knee   3. Internal derangement of left knee   4. Effusion, left knee     Plan: The main ways of treat osteoarthritis, that are found to be success. Weight loss helps to decrease pain. Exercise is important to maintaining cartilage and thickness and strengthening. NSAIDs like meloxicam, motrin, tylenol, alleve are meds decreasing the inflamation. Ice is okay  In afternoon and evening and hot shower in the am Avoid overhead lifting and overhead use of the arms. Do not lift greater than 5 lbs. Adjust head rest in vehicle to prevent hyperextension if rear ended. Take extra precautions to avoid falling. MRI left knee and refer to Dr. Ninfa Linden for evaluation and treatment of the left knee.  Follow-Up Instructions: No follow-ups on file.   Orders:  Orders Placed This Encounter  Procedures  . XR Cervical Spine 2 or 3 views   No orders of the defined types were placed in this encounter.   Imaging: Xr Cervical Spine 2 Or 3 Views  Result Date: 01/08/2018 AP and lateral  flexion and extension radiographs of the cervical spine show anterior plate and screws C5 to C7  With posterior lateral mass screws and rods bilateral. With flexion and extension there is 1.2 mm of motion and this is near 1.0 mm that is considered definite sign of Healed fusion. Other measured points are nearly exacty 1.0 mm.   PMFS History: Patient Active Problem List   Diagnosis Date Noted  . 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 Other spondylosis with radiculopathy, cervical region 10/09/2017    Priority: High    Class: Chronic  . Lumbar degenerative disc disease 01/12/2013    Priority: High    Class: Chronic  . HNP (herniated nucleus pulposus), lumbar 01/12/2013    Priority: High    Class: Chronic  . Spinal stenosis, lumbar region, with neurogenic claudication 01/12/2013    Priority: High    Class: Chronic  . S/P cervical spinal fusion 01/20/2017  . Memory difficulties 10/14/2016  . Tachycardia 07/12/2016  . Chest pain 07/12/2016  . Polysubstance abuse ([place]) 07/12/2016  . SIRS (systemic inflammatory response syndrome) ([place]) 07/12/2016  . Lactic acidosis 07/12/2016  . Anxiety state 07/12/2016  . Abnormal brain MRI 07/08/2016  . Chronic pain syndrome 07/08/2016  . Chronic migraine w/o aura w/o status migrainosus, not intractable 07/08/2016  . History of optic neuritis 06/02/2016  . Numbness  06/02/2016  . Gait disturbance 06/02/2016  . Urinary frequency 06/02/2016  . Other fatigue 06/02/2016  . Leg cramp 06/02/2016  . Pain in right hand 05/26/2016  . Chronic bilateral low back pain 05/26/2016  . Closed fracture of body of sternum 05/26/2016  . Nail bed injury right middle finger 07/13/2014   Past Medical History:  Diagnosis Date  . Anxiety   . Arthritis   . Asthma   . Cancer ([place])    skin   . Chronic kidney disease    AA      . Claustrophobia   . Complication of anesthesia    pt states he is unable to tol.  foleys,due to anatomy  . Coronary artery disease   . Headache    pt reports migraines  . Hypoglycemia    las t episode 12/24/12  . Multiple sclerosis ([place])   . 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 Seasonal allergies   . Seizures ([place])   . Sleep apnea    Pt reports "mild sleep apnea"  no cpap  . Vision abnormalities     Family History  Problem Relation Age of Onset  . Hypertension Mother   . Obesity Mother   . Diabetes Mother   . Anxiety disorder Mother   . Ataxia Neg Hx   . Chorea Neg Hx   . Dementia Neg Hx   . Mental retardation Neg Hx   . Migraines Neg Hx   . Multiple sclerosis Neg Hx   . Neurofibromatosis Neg Hx   . Neuropathy Neg Hx   . Parkinsonism Neg Hx   . Seizures Neg Hx   . Stroke Neg Hx     Past Surgical History:  Procedure Laterality Date  . ANTERIOR CERVICAL DECOMP/DISCECTOMY FUSION N/A 01/20/2017   Procedure: C6 CORPECTOMY WITH BILATERAL FORAMINOTOMIES C5-6 AND C6-7, FUSION C5-7 ANTERIORLY WITH STRUT ALLOGRAFT AND LOCAL BONE GRAFT, ANTERIOR CERVICAL PLATE AND SCREWS;  Surgeon: Jessy Oto, MD;  Location: [facility];  Service: Orthopedics;  Laterality: N/A;  . BACK SURGERY    . HEMATOMA EVACUATION Right 07/13/2014   Procedure: EVACUATION HEMATOMA RIGHT MIDDLE FINGER;  Surgeon: Mcarthur Rossetti, MD;  Location: [facility];  Service: Orthopedics;  Laterality: Right;  . KNEE SURGERY Left    x2  arthroscopy  . NOSE SURGERY    . POSTERIOR CERVICAL FUSION/FORAMINOTOMY N/A 10/09/2017   Procedure: POSTERIOR CERVICAL FUSION C5-6 AND C6-7 WITH POSTERIOR LATERAL MASS SCREWS AND RODS C5-7, POSTERIOR LEFT C6-7 FORAMINOTOMY, POSTERIOR LEFT ILIAC CREST BONE GRAFT HARVEST;  Surgeon: Jessy Oto, MD;  Location: [facility];  Service: Orthopedics;  Laterality: N/A;  . POSTERIOR FUSION LUMBAR SPINE  01/10/2013   Dr Louanne Skye  . WISDOM TOOTH EXTRACTION     Social History   Occupational History  . Not on file  Tobacco Use  . Smoking status: Never Smoker  . Smokeless tobacco: Never  Used  Substance and Sexual Activity  . Alcohol use: No  . Drug use: No  . Sexual activity: Not on file

## 2018-01-08 NOTE — Patient Instructions (Addendum)
Plan: The main ways of treat osteoarthritis, that are found to be success. Weight loss helps to decrease pain. Exercise is important to maintaining cartilage and thickness and strengthening. NSAIDs like meloxicam, motrin, tylenol, alleve are meds decreasing the inflamation. Ice is okay  In afternoon and evening and hot shower in the am Avoid overhead lifting and overhead use of the arms. Do not lift greater than 5 lbs. Adjust head rest in vehicle to prevent hyperextension if rear ended. Take extra precautions to avoid falling. MRI left knee and refer to Dr. Ninfa Linden for evaluation and treatment of the left knee. Mobic or meloxicam shoulder be restarted.

## 2018-01-16 ENCOUNTER — Ambulatory Visit (HOSPITAL_BASED_OUTPATIENT_CLINIC_OR_DEPARTMENT_OTHER)
Admission: RE | Admit: 2018-01-16 | Discharge: 2018-01-16 | Disposition: A | Discharge status: Home / Self Care | Payer: 59 | Source: Ambulatory Visit | Attending: Specialist | Admitting: Specialist

## 2018-01-16 DIAGNOSIS — M1712 Unilateral primary osteoarthritis, left knee: Secondary | ICD-10-CM | POA: Diagnosis not present

## 2018-01-16 DIAGNOSIS — G8929 Other chronic pain: Secondary | ICD-10-CM | POA: Diagnosis present

## 2018-01-16 DIAGNOSIS — M25562 Pain in left knee: Secondary | ICD-10-CM | POA: Diagnosis present

## 2018-01-28 ENCOUNTER — Ambulatory Visit (INDEPENDENT_AMBULATORY_CARE_PROVIDER_SITE_OTHER): Payer: Medicare Other | Admitting: Orthopaedic Surgery

## 2018-02-01 ENCOUNTER — Ambulatory Visit (INDEPENDENT_AMBULATORY_CARE_PROVIDER_SITE_OTHER): Payer: Medicare Other | Admitting: Orthopaedic Surgery

## 2018-02-09 ENCOUNTER — Ambulatory Visit (INDEPENDENT_AMBULATORY_CARE_PROVIDER_SITE_OTHER): Payer: Medicare Other | Admitting: Orthopaedic Surgery

## 2018-03-09 ENCOUNTER — Other Ambulatory Visit (INDEPENDENT_AMBULATORY_CARE_PROVIDER_SITE_OTHER): Payer: Self-pay | Admitting: Specialist

## 2018-03-09 NOTE — Telephone Encounter (Signed)
Tizanidine refill request 

## 2018-04-07 ENCOUNTER — Ambulatory Visit (INDEPENDENT_AMBULATORY_CARE_PROVIDER_SITE_OTHER): Payer: 59 | Admitting: Specialist

## 2018-04-07 ENCOUNTER — Ambulatory Visit (INDEPENDENT_AMBULATORY_CARE_PROVIDER_SITE_OTHER): Payer: Self-pay

## 2018-04-07 ENCOUNTER — Encounter (INDEPENDENT_AMBULATORY_CARE_PROVIDER_SITE_OTHER): Payer: Self-pay | Admitting: Specialist

## 2018-04-07 VITALS — BP 112/67 | HR 60 | Ht 72.0 in | Wt 195.0 lb

## 2018-04-07 DIAGNOSIS — Z981 Arthrodesis status: Secondary | ICD-10-CM | POA: Diagnosis not present

## 2018-04-07 DIAGNOSIS — M1712 Unilateral primary osteoarthritis, left knee: Secondary | ICD-10-CM

## 2018-04-07 MED ORDER — DICLOFENAC SODIUM 50 MG PO TBEC: 50.0000 mg | DELAYED_RELEASE_TABLET | Freq: Two times a day (BID) | ORAL | 3 refills | Status: DC

## 2018-04-07 NOTE — Patient Instructions (Addendum)
Avoid overhead lifting and overhead use of the arms. Do not lift greater than 5 lbs. Adjust head rest in vehicle to prevent hyperextension if rear ended. Take extra precautions to avoid falling. The main ways of treat osteoarthritis, that are found to be success. Weight loss helps to decrease pain. Exercise is important to maintaining cartilage and thickness and strengthening. NSAIDs like motrin, tylenol, alleve diclofenac are meds decreasing the inflamation. Ice is okay  In afternoon and evening and hot shower in the am Please call a month ahead of follow up appt so we can order new Synvisc One material. Diclofenac 50 mg po BID with meal or snack Cane left hand with walking if necessary. Extension exercises of the left knee.

## 2018-04-07 NOTE — Progress Notes (Signed)
Office Visit Note   Patient: Joel Galloway           Date of Birth: 1960-12-11           MRN: [id number] Visit Date: 04/07/2018              Requested by: [name], MD [address], [place] [address] PCP: [name], MD   Assessment & Plan: Visit Diagnoses:  1. S/P cervical spinal fusion   2. Unilateral primary osteoarthritis, left knee     Plan: Avoid overhead lifting and overhead use of the arms. Do not lift greater than 5 lbs. Adjust head rest in vehicle to prevent hyperextension if rear ended. Take extra precautions to avoid falling. The main ways of treat osteoarthritis, that are found to be success. Weight loss helps to decrease pain. Exercise is important to maintaining cartilage and thickness and strengthening. NSAIDs like motrin, tylenol, alleve diclofenac are meds decreasing the inflamation. Ice is okay  In afternoon and evening and hot shower in the am Please call a month ahead of follow up appt so we can order new Synvisc One material. Diclofenac 50 mg po BID with meal or snack Cane left hand with walking if necessary. Extension exercises of the left knee.  Follow-Up Instructions: No follow-ups on file.   Orders:  Orders Placed This Encounter  Procedures  . XR Cervical Spine 2 or 3 views   No orders of the defined types were placed in this encounter.     Procedures: No procedures performed   Clinical Data: No additional findings.   Subjective: Chief Complaint  Patient presents with  . Neck - Follow-up    57 year old male with intermittant left knee pain it either hurts or it doesn't hurt. Ice helps, he has had 2 athroscopies and has persistent pain and swelling.   Review of Systems  Constitutional: Negative.  Negative for activity change, appetite change, chills, diaphoresis, fatigue, fever and unexpected weight change.  HENT: Negative.   Eyes: Negative.   Respiratory: Negative.   Cardiovascular: Negative.     Gastrointestinal: Negative.   Endocrine: Negative.   Genitourinary: Negative.   Musculoskeletal: Negative.   Skin: Negative.   Allergic/Immunologic: Negative.   Neurological: Negative.   Hematological: Negative.   Psychiatric/Behavioral: Negative.      Objective: Vital Signs: BP 112/67 (BP Location: Left Arm, Patient Position: Sitting)   Pulse 60   Ht 6' (1.829 m)   Wt 195 lb (88.5 kg)   BMI 26.45 kg/m   Physical Exam  Left Knee Exam   Muscle Strength  The patient has normal left knee strength.  Tenderness  The patient is experiencing tenderness in the medial joint line and lateral retinaculum.  Range of Motion  Extension: normal  Flexion: 120   Tests  McMurray:  Medial - negative Lateral - negative Varus: negative Valgus: negative Lachman:  Anterior - negative    Posterior - negative Pivot shift: negative Patellar apprehension: positive  Other  Erythema: absent Scars: present Pulse: present Swelling: mild      Specialty Comments:  No specialty comments available.  Imaging: Xr Cervical Spine 2 Or 3 Views  Result Date: 04/07/2018 ACDF C5-6 and C6-7 with plate and screws and strut graft C5 to C7, posterior lateral mass screws and rod extend from C5 to C7, no significant movement across the fusion site C5 to C7. There is anterolisthesis C3-4 worsens with flexion from 2 to 3.5 mm. No fracture  or dislocation.    PMFS History: Patient Active Problem List   Diagnosis Date Noted  . 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 Other spondylosis with radiculopathy, cervical region 10/09/2017    Priority: High    Class: Chronic  . Lumbar degenerative disc disease 01/12/2013    Priority: High    Class: Chronic  . HNP (herniated nucleus pulposus), lumbar 01/12/2013    Priority: High    Class: Chronic  . Spinal stenosis, lumbar region, with neurogenic claudication 01/12/2013    Priority: High    Class:  Chronic  . S/P cervical spinal fusion 01/20/2017  . Memory difficulties 10/14/2016  . Tachycardia 07/12/2016  . Chest pain 07/12/2016  . Polysubstance abuse ([place]) 07/12/2016  . SIRS (systemic inflammatory response syndrome) ([place]) 07/12/2016  . Lactic acidosis 07/12/2016  . Anxiety state 07/12/2016  . Abnormal brain MRI 07/08/2016  . Chronic pain syndrome 07/08/2016  . Chronic migraine w/o aura w/o status migrainosus, not intractable 07/08/2016  . History of optic neuritis 06/02/2016  . Numbness 06/02/2016  . Gait disturbance 06/02/2016  . Urinary frequency 06/02/2016  . Other fatigue 06/02/2016  . Leg cramp 06/02/2016  . Pain in right hand 05/26/2016  . Chronic bilateral low back pain 05/26/2016  . Closed fracture of body of sternum 05/26/2016  . Nail bed injury right middle finger 07/13/2014   Past Medical History:  Diagnosis Date  . Anxiety   . Arthritis   . Asthma   . Cancer ([place])    skin   . Chronic kidney disease    AA      . Claustrophobia   . Complication of anesthesia    pt states he is unable to tol. foleys,due to anatomy  . Coronary artery disease   . Headache    pt reports migraines  . Hypoglycemia    las t episode 12/24/12  . Multiple sclerosis ([place])   . 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 Seasonal allergies   . Seizures ([place])   . Sleep apnea    Pt reports "mild sleep apnea"  no cpap  . Vision abnormalities     Family History  Problem Relation Age of Onset  . Hypertension Mother   . Obesity Mother   . Diabetes Mother   . Anxiety disorder Mother   . Ataxia Neg Hx   . Chorea Neg Hx   . Dementia Neg Hx   . Mental retardation Neg Hx   . Migraines Neg Hx   . Multiple sclerosis Neg Hx   . Neurofibromatosis Neg Hx   . Neuropathy Neg Hx   . Parkinsonism Neg Hx   . Seizures Neg Hx   . Stroke Neg Hx     Past Surgical History:  Procedure Laterality Date  . ANTERIOR CERVICAL DECOMP/DISCECTOMY FUSION N/A 01/20/2017   Procedure: C6 CORPECTOMY WITH  BILATERAL FORAMINOTOMIES C5-6 AND C6-7, FUSION C5-7 ANTERIORLY WITH STRUT ALLOGRAFT AND LOCAL BONE GRAFT, ANTERIOR CERVICAL PLATE AND SCREWS;  Surgeon: Jessy Oto, MD;  Location: [facility];  Service: Orthopedics;  Laterality: N/A;  . BACK SURGERY    . HEMATOMA EVACUATION Right 07/13/2014   Procedure: EVACUATION HEMATOMA RIGHT MIDDLE FINGER;  Surgeon: Mcarthur Rossetti, MD;  Location: [facility];  Service: Orthopedics;  Laterality: Right;  . KNEE SURGERY Left    x2  arthroscopy  . NOSE SURGERY    . POSTERIOR CERVICAL FUSION/FORAMINOTOMY N/A  10/09/2017   Procedure: POSTERIOR CERVICAL FUSION C5-6 AND C6-7 WITH POSTERIOR LATERAL MASS SCREWS AND RODS C5-7, POSTERIOR LEFT C6-7 FORAMINOTOMY, POSTERIOR LEFT ILIAC CREST BONE GRAFT HARVEST;  Surgeon: Jessy Oto, MD;  Location: [facility];  Service: Orthopedics;  Laterality: N/A;  . POSTERIOR FUSION LUMBAR SPINE  01/10/2013   Dr Louanne Skye  . WISDOM TOOTH EXTRACTION     Social History   Occupational History  . Not on file  Tobacco Use  . Smoking status: Never Smoker  . Smokeless tobacco: Never Used  Substance and Sexual Activity  . Alcohol use: No  . Drug use: No  . Sexual activity: Not on file

## 2018-05-06 ENCOUNTER — Other Ambulatory Visit (INDEPENDENT_AMBULATORY_CARE_PROVIDER_SITE_OTHER): Payer: Self-pay | Admitting: Specialist

## 2018-05-26 ENCOUNTER — Ambulatory Visit (INDEPENDENT_AMBULATORY_CARE_PROVIDER_SITE_OTHER): Payer: Medicare Other | Admitting: Specialist

## 2018-06-09 ENCOUNTER — Encounter (INDEPENDENT_AMBULATORY_CARE_PROVIDER_SITE_OTHER): Payer: Self-pay | Admitting: Specialist

## 2018-06-09 ENCOUNTER — Ambulatory Visit (INDEPENDENT_AMBULATORY_CARE_PROVIDER_SITE_OTHER): Payer: Medicare Other | Admitting: Specialist

## 2018-06-09 ENCOUNTER — Ambulatory Visit (INDEPENDENT_AMBULATORY_CARE_PROVIDER_SITE_OTHER): Payer: Self-pay

## 2018-06-09 ENCOUNTER — Telehealth (INDEPENDENT_AMBULATORY_CARE_PROVIDER_SITE_OTHER): Payer: Self-pay | Admitting: Specialist

## 2018-06-09 VITALS — BP 110/70 | HR 68 | Ht 72.0 in | Wt 195.0 lb

## 2018-06-09 DIAGNOSIS — Z981 Arthrodesis status: Secondary | ICD-10-CM | POA: Diagnosis not present

## 2018-06-09 DIAGNOSIS — M222X1 Patellofemoral disorders, right knee: Secondary | ICD-10-CM

## 2018-06-09 DIAGNOSIS — M2242 Chondromalacia patellae, left knee: Secondary | ICD-10-CM

## 2018-06-09 DIAGNOSIS — M48062 Spinal stenosis, lumbar region with neurogenic claudication: Secondary | ICD-10-CM | POA: Diagnosis not present

## 2018-06-09 MED ORDER — TIZANIDINE HCL 4 MG PO TABS: 4.0000 mg | ORAL_TABLET | Freq: Four times a day (QID) | ORAL | 0 refills | Status: AC | PRN

## 2018-06-09 MED ORDER — DICLOFENAC SODIUM 3 % TD GEL: 4.0000 g | Freq: Four times a day (QID) | TRANSDERMAL | 2 refills | Status: DC | PRN

## 2018-06-09 MED ORDER — METHYLPREDNISOLONE ACETATE 40 MG/ML IJ SUSP
40.0000 mg | INTRAMUSCULAR | Status: AC | PRN
  Administered 2018-06-09: 40 mg via INTRA_ARTICULAR

## 2018-06-09 MED ORDER — BUPIVACAINE HCL 0.25 % IJ SOLN
4.0000 mL | INTRAMUSCULAR | Status: AC | PRN
  Administered 2018-06-09: 4 mL via INTRA_ARTICULAR

## 2018-06-09 MED ORDER — DICLOFENAC SODIUM 50 MG PO TBEC: 50.0000 mg | DELAYED_RELEASE_TABLET | Freq: Two times a day (BID) | ORAL | 3 refills | Status: DC

## 2018-06-09 NOTE — Patient Instructions (Addendum)
  Plan: Avoid bending, stooping and avoid lifting weights greater than 10 lbs. Avoid prolong standing and walking. Avoid frequent bending and stooping  No lifting greater than 10 lbs. May use ice or moist heat for pain. Weight loss is of benefit. Handicap license is approved. Dr.Phillips maybe able to arrange for epidural steroid injection for mild to moderate L4-5 lateral recess stenosis above L5-S1  Fusion.  Knee is suffering from osteoarthritis, only real proven treatments are Weight loss, NSIADs like diclofenac and exercise. Well padded shoes help. Ice the knee 2-3 times a day 15-20 mins at a time.

## 2018-06-09 NOTE — Telephone Encounter (Signed)
Lisa/[facility]/Pharmacist called to state received RX from [place] today tiZANidine  Patient is getting dicllsenaz,ibprofen and meloxicam  From Dr. Micheline Chapman. The 3 meds are the same thing and she has a concern and wanting to know if she should still fill scripts from today.  Please call Lattie Haw @ [facility] [phone]

## 2018-06-09 NOTE — Telephone Encounter (Signed)
Per Lattie Haw he is getting Soma from another provider and she states that where the tizanidine question is coming in. Also I cancelled the oral Diclofenac and changed the Gel from 3% to the 1 %. She states that Dr. Louanne Skye had him on the Meloxicam, and was wondering if Dr. Louanne Skye wanted to keep him on that?
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
## 2018-06-14 ENCOUNTER — Ambulatory Visit (INDEPENDENT_AMBULATORY_CARE_PROVIDER_SITE_OTHER): Payer: 59 | Admitting: Orthopaedic Surgery

## 2018-06-14 ENCOUNTER — Encounter (INDEPENDENT_AMBULATORY_CARE_PROVIDER_SITE_OTHER): Payer: Self-pay | Admitting: Orthopaedic Surgery

## 2018-06-14 ENCOUNTER — Telehealth (INDEPENDENT_AMBULATORY_CARE_PROVIDER_SITE_OTHER): Payer: Self-pay

## 2018-06-14 ENCOUNTER — Other Ambulatory Visit (INDEPENDENT_AMBULATORY_CARE_PROVIDER_SITE_OTHER): Payer: Self-pay

## 2018-06-14 ENCOUNTER — Telehealth (INDEPENDENT_AMBULATORY_CARE_PROVIDER_SITE_OTHER): Payer: Self-pay | Admitting: Specialist

## 2018-06-14 DIAGNOSIS — M25511 Pain in right shoulder: Secondary | ICD-10-CM | POA: Diagnosis not present

## 2018-06-14 DIAGNOSIS — M25561 Pain in right knee: Secondary | ICD-10-CM

## 2018-06-14 DIAGNOSIS — M1712 Unilateral primary osteoarthritis, left knee: Secondary | ICD-10-CM

## 2018-06-14 DIAGNOSIS — M1711 Unilateral primary osteoarthritis, right knee: Secondary | ICD-10-CM

## 2018-06-14 DIAGNOSIS — M25562 Pain in left knee: Secondary | ICD-10-CM | POA: Diagnosis not present

## 2018-06-14 DIAGNOSIS — G8929 Other chronic pain: Secondary | ICD-10-CM

## 2018-06-14 MED ORDER — LIDOCAINE HCL 1 % IJ SOLN
3.0000 mL | INTRAMUSCULAR | Status: AC | PRN
  Administered 2018-06-14: 3 mL

## 2018-06-14 MED ORDER — METHYLPREDNISOLONE ACETATE 40 MG/ML IJ SUSP
40.0000 mg | INTRAMUSCULAR | Status: AC | PRN
  Administered 2018-06-14: 40 mg via INTRA_ARTICULAR

## 2018-06-14 NOTE — Progress Notes (Signed)
Office Visit Note   Patient: Joel Galloway           Date of Birth: 1960-09-29           MRN: [id number] Visit Date: 06/14/2018              Requested by: [name], MD [address] [place], [place] [address] PCP: [name], MD   Assessment & Plan: Visit Diagnoses:  1. Chronic pain of right knee   2. Chronic pain of left knee   3. Unilateral primary osteoarthritis, left knee   4. Unilateral primary osteoarthritis, right knee     Plan: I was able to place a steroid injection in his right shoulder today in the subacromial outlet.  I do feel he is a candidate for hyaluronic acid for both his knees given his mild to moderate arthritic changes.  He agrees with this as well.  We will order that for his knees and see him back in follow-up to place those in his knees.  Follow-Up Instructions: Return in about 4 weeks (around 07/12/2018).   Orders:  No orders of the defined types were placed in this encounter.  No orders of the defined types were placed in this encounter.     Procedures: Large Joint Inj: R subacromial bursa on 06/14/2018 9:16 AM Indications: pain and diagnostic evaluation Details: 22 G 1.5 in needle  Arthrogram: No  Medications: 3 mL lidocaine 1 %; 40 mg methylPREDNISolone acetate 40 MG/ML Outcome: tolerated well, no immediate complications Procedure, treatment alternatives, risks and benefits explained, specific risks discussed. Consent was given by the patient. Immediately prior to procedure a time out was called to verify the correct patient, procedure, equipment, support staff and site/side marked as required. Patient was prepped and draped in the usual sterile fashion.       Clinical Data: No additional findings.   Subjective: Chief Complaint  Patient presents with  . Right Knee - Follow-up, Pain  . Left Knee - Follow-up, Pain  The patient comes in today with chief complaint of bilateral knee pain left worse than right as well as right  shoulder pain.  He actually had a lateral injection of steroid last week in his knee and it helps some.  An MRI was reviewed with him that was done in September of his left knee.  It pops on a daily basis and snaps it is really bothersome to him.  His right shoulder hurts with reaching behind him and it with overhead activities.  HPI  Review of Systems He currently denies any headache, chest pain, shortness of breath, fever, chills, nausea, vomiting  Objective: Vital Signs: There were no vitals taken for this visit.  Physical Exam He is alert and orient x3 and in no acute distress Ortho Exam His left knee and right knee move fluidly which is global tenderness.  Most of his pain is laterally at the left knee around the IT band and the lateral compartment of the knee.  Both knees feel ligamentously stable.  Examination of the right shoulder shows a lot of grinding at the glenohumeral joint and pain with overhead activities and reaching behind him but her range of motion is almost full.  The rotator cuff feels like it is got normal integrity. Specialty Comments:  No specialty comments available.  Imaging: No results found. The MRI of his left knee shows significant arthritic changes and mainly lateral compartment of his knee with no new meniscal tear.  PMFS History: Patient Active Problem List   Diagnosis Date Noted  . 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 Other spondylosis with radiculopathy, cervical region 10/09/2017    Class: Chronic  . S/P cervical spinal fusion 01/20/2017  . Memory difficulties 10/14/2016  . Tachycardia 07/12/2016  . Chest pain 07/12/2016  . Polysubstance abuse ([facility]) 07/12/2016  . SIRS (systemic inflammatory response syndrome) ([facility]) 07/12/2016  . Lactic acidosis 07/12/2016  . Anxiety state 07/12/2016  . Abnormal brain MRI 07/08/2016  . Chronic pain syndrome 07/08/2016  . Chronic migraine w/o aura w/o status migrainosus,  not intractable 07/08/2016  . History of optic neuritis 06/02/2016  . Numbness 06/02/2016  . Gait disturbance 06/02/2016  . Urinary frequency 06/02/2016  . Other fatigue 06/02/2016  . Leg cramp 06/02/2016  . Pain in right hand 05/26/2016  . Chronic bilateral low back pain 05/26/2016  . Closed fracture of body of sternum 05/26/2016  . Nail bed injury right middle finger 07/13/2014  . Lumbar degenerative disc disease 01/12/2013    Class: Chronic  . HNP (herniated nucleus pulposus), lumbar 01/12/2013    Class: Chronic  . Spinal stenosis, lumbar region, with neurogenic claudication 01/12/2013    Class: Chronic   Past Medical History:  Diagnosis Date  . Anxiety   . Arthritis   . Asthma   . Cancer ([place] [facility])    skin   . Chronic kidney disease    AA      . Claustrophobia   . Complication of anesthesia    pt states he is unable to tol. foleys,due to anatomy  . Coronary artery disease   . Headache    pt reports migraines  . Hypoglycemia    las t episode 12/24/12  . Multiple sclerosis ([facility])   . 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 Seasonal allergies   . Seizures ([facility])   . Sleep apnea    Pt reports "mild sleep apnea"  no cpap  . Vision abnormalities     Family History  Problem Relation Age of Onset  . Hypertension Mother   . Obesity Mother   . Diabetes Mother   . Anxiety disorder Mother   . Ataxia Neg Hx   . Chorea Neg Hx   . Dementia Neg Hx   . Mental retardation Neg Hx   . Migraines Neg Hx   . Multiple sclerosis Neg Hx   . Neurofibromatosis Neg Hx   . Neuropathy Neg Hx   . Parkinsonism Neg Hx   . Seizures Neg Hx   . Stroke Neg Hx     Past Surgical History:  Procedure Laterality Date  . ANTERIOR CERVICAL DECOMP/DISCECTOMY FUSION N/A 01/20/2017   Procedure: C6 CORPECTOMY WITH BILATERAL FORAMINOTOMIES C5-6 AND C6-7, FUSION C5-7 ANTERIORLY WITH STRUT ALLOGRAFT AND LOCAL BONE GRAFT, ANTERIOR CERVICAL PLATE AND SCREWS;  Surgeon: Jessy Oto, MD;  Location: [facility];   Service: Orthopedics;  Laterality: N/A;  . BACK SURGERY    . HEMATOMA EVACUATION Right 07/13/2014   Procedure: EVACUATION HEMATOMA RIGHT MIDDLE FINGER;  Surgeon: Mcarthur Rossetti, MD;  Location: [facility];  Service: Orthopedics;  Laterality: Right;  . KNEE SURGERY Left    x2  arthroscopy  . NOSE SURGERY    . POSTERIOR CERVICAL FUSION/FORAMINOTOMY N/A 10/09/2017   Procedure: POSTERIOR CERVICAL FUSION C5-6 AND C6-7 WITH POSTERIOR LATERAL MASS SCREWS AND RODS C5-7, POSTERIOR LEFT C6-7 FORAMINOTOMY, POSTERIOR LEFT ILIAC CREST BONE GRAFT HARVEST;  Surgeon: Jessy Oto, MD;  Location: [facility];  Service: Orthopedics;  Laterality: N/A;  . POSTERIOR FUSION LUMBAR SPINE  01/10/2013   Dr Louanne Skye  . WISDOM TOOTH EXTRACTION     Social History   Occupational History  . Not on file  Tobacco Use  . Smoking status: Never Smoker  . Smokeless tobacco: Never Used  Substance and Sexual Activity  . Alcohol use: No  . Drug use: No  . Sexual activity: Not on file

## 2018-06-14 NOTE — Telephone Encounter (Signed)
Bilateral knee gel injections 

## 2018-06-14 NOTE — Telephone Encounter (Signed)
Pharmacy  [facility] same as before   Patient needs his gel corrected it was filled as 1% not 3 % and his insurance will not cover it. Dicofenac Sodium 3% Gel

## 2018-06-16 NOTE — Telephone Encounter (Signed)
I called and spoke with patient to clarify about the 3% vs 1%, I advised that we don't usually rx the 3% here at our office, he states that is what he has, I advised that I would have to discuss with Dr. Louanne Skye.

## 2018-06-21 NOTE — Telephone Encounter (Signed)
I submitted on Cover my meds, and it is pending approval

## 2018-06-21 NOTE — Telephone Encounter (Signed)
Per Dr. Louanne Skye insurance will not cover 3%.  He has to use 1%. I called and advised the patient and he is persistent that he can get 3% with his insurance.

## 2018-06-22 ENCOUNTER — Other Ambulatory Visit (INDEPENDENT_AMBULATORY_CARE_PROVIDER_SITE_OTHER): Payer: Self-pay | Admitting: Radiology

## 2018-06-22 MED ORDER — DICLOFENAC SODIUM 1 % TD GEL: 4.0000 g | Freq: Four times a day (QID) | TRANSDERMAL | 2 refills | Status: DC

## 2018-06-22 NOTE — Telephone Encounter (Signed)
Noted  

## 2018-06-22 NOTE — Telephone Encounter (Signed)
I called and advised that his insurance denied the 3% and that I will send in for the 1% Diclofenac Gel and I advised that it may require the PA also but usually we can get this approved.

## 2018-06-24 ENCOUNTER — Telehealth (INDEPENDENT_AMBULATORY_CARE_PROVIDER_SITE_OTHER): Payer: Self-pay

## 2018-06-24 NOTE — Telephone Encounter (Signed)
Submitted VOB for SynviscOne, bilateral knee. 

## 2018-06-30 ENCOUNTER — Telehealth (INDEPENDENT_AMBULATORY_CARE_PROVIDER_SITE_OTHER): Payer: Self-pay

## 2018-06-30 NOTE — Telephone Encounter (Signed)
Submitted VOB for Durolane, bilateral knee due to [facility] changing its preferred products to  Durolane, Gelsyn-3, and Supartz.

## 2018-07-02 ENCOUNTER — Telehealth (INDEPENDENT_AMBULATORY_CARE_PROVIDER_SITE_OTHER): Payer: Self-pay

## 2018-07-02 NOTE — Telephone Encounter (Signed)
Patient is approved for Durolane, bilateral knee. Meet deductible first Patient will be responsible for 20% OOP. No Co-pay No PA required  Appt. 07/12/2018 with Dr. Ninfa Linden

## 2018-07-12 ENCOUNTER — Encounter (INDEPENDENT_AMBULATORY_CARE_PROVIDER_SITE_OTHER): Payer: Self-pay | Admitting: Orthopaedic Surgery

## 2018-07-12 ENCOUNTER — Ambulatory Visit (INDEPENDENT_AMBULATORY_CARE_PROVIDER_SITE_OTHER): Payer: 59 | Admitting: Orthopaedic Surgery

## 2018-07-12 DIAGNOSIS — M1712 Unilateral primary osteoarthritis, left knee: Secondary | ICD-10-CM

## 2018-07-12 DIAGNOSIS — M1711 Unilateral primary osteoarthritis, right knee: Secondary | ICD-10-CM | POA: Diagnosis not present

## 2018-07-12 MED ORDER — SODIUM HYALURONATE 60 MG/3ML IX PRSY
60.0000 mg | PREFILLED_SYRINGE | INTRA_ARTICULAR | Status: AC | PRN
  Administered 2018-07-12: 60 mg via INTRA_ARTICULAR

## 2018-07-12 MED ORDER — LIDOCAINE HCL 1 % IJ SOLN
3.0000 mL | INTRAMUSCULAR | Status: AC | PRN
  Administered 2018-07-12: 3 mL

## 2018-07-12 NOTE — Progress Notes (Signed)
   Procedure Note  Patient: Joel Galloway             Date of Birth: 24-Sep-1960           MRN: [id number]             Visit Date: 07/12/2018  Procedures: Visit Diagnoses: No diagnosis found.  Large Joint Inj: R knee on 07/12/2018 10:29 AM Indications: diagnostic evaluation and pain Details: 22 G 1.5 in needle, superolateral approach  Arthrogram: No  Medications: 3 mL lidocaine 1 %; 60 mg Sodium Hyaluronate 60 MG/3ML Outcome: tolerated well, no immediate complications Procedure, treatment alternatives, risks and benefits explained, specific risks discussed. Consent was given by the patient. Immediately prior to procedure a time out was called to verify the correct patient, procedure, equipment, support staff and site/side marked as required. Patient was prepped and draped in the usual sterile fashion.   Large Joint Inj: L knee on 07/12/2018 10:29 AM Indications: diagnostic evaluation and pain Details: 22 G 1.5 in needle, superolateral approach  Arthrogram: No  Medications: 3 mL lidocaine 1 %; 60 mg Sodium Hyaluronate 60 MG/3ML Outcome: tolerated well, no immediate complications Procedure, treatment alternatives, risks and benefits explained, specific risks discussed. Consent was given by the patient. Immediately prior to procedure a time out was called to verify the correct patient, procedure, equipment, support staff and site/side marked as required. Patient was prepped and draped in the usual sterile fashion.    This patient is scheduled today to receive Durolane injections in both his knees to treat the pain from osteoarthritis.  This is been well-documented.  He still having problems with his left knee in terms of instability symptoms and popping along the lateral aspect of his knee.  A previous MRI from just a few months ago showed worsening arthritis in the lateral compartment of his knee but no tear of the collateral ligaments of the IT band.  On exam neither knee has an effusion  today.  He tolerated the injections well without any difficulty.  He did let me know that a large cabinet landed on his head recently.  This is caused headaches and pain in his head from his neck or down his low back.  He certainly could have a concussion.  He has had previous back surgery as well.  If this stays problematic for him to let us know.  I will see him back in 6 weeks as he is doing overall.

## 2018-07-19 ENCOUNTER — Ambulatory Visit: Payer: Self-pay | Admitting: Cardiology

## 2018-07-21 ENCOUNTER — Other Ambulatory Visit (INDEPENDENT_AMBULATORY_CARE_PROVIDER_SITE_OTHER): Payer: Self-pay | Admitting: Specialist

## 2018-07-21 NOTE — Telephone Encounter (Signed)
Diclofenac refill request 

## 2018-07-22 ENCOUNTER — Telehealth (INDEPENDENT_AMBULATORY_CARE_PROVIDER_SITE_OTHER): Payer: Self-pay

## 2018-07-22 NOTE — Telephone Encounter (Signed)
Patient left voicemail on triage phone. States cabinet f ell off wall and injured neck and would like to know if he needs xray. Would like a callback.  [phone]

## 2018-07-23 NOTE — Telephone Encounter (Signed)
I called and lmom for pt to that he would need to schedule an appt with someone to be seen and evaluated, I advised that he can see Jeneen Rinks next week or with Dr. Junius Roads

## 2018-07-26 ENCOUNTER — Telehealth (INDEPENDENT_AMBULATORY_CARE_PROVIDER_SITE_OTHER): Payer: Self-pay | Admitting: Specialist

## 2018-07-26 NOTE — Telephone Encounter (Signed)
Worked him on 07/27/2018

## 2018-07-26 NOTE — Telephone Encounter (Signed)
Patient returned call asked for a call back. The number to contact patient is [phone]

## 2018-07-27 ENCOUNTER — Ambulatory Visit (INDEPENDENT_AMBULATORY_CARE_PROVIDER_SITE_OTHER): Payer: 59 | Admitting: Specialist

## 2018-07-27 ENCOUNTER — Encounter (INDEPENDENT_AMBULATORY_CARE_PROVIDER_SITE_OTHER): Payer: Self-pay | Admitting: Specialist

## 2018-07-27 ENCOUNTER — Ambulatory Visit (INDEPENDENT_AMBULATORY_CARE_PROVIDER_SITE_OTHER): Payer: 59

## 2018-07-27 VITALS — BP 106/71 | HR 66 | Ht 72.0 in | Wt 195.0 lb

## 2018-07-27 DIAGNOSIS — S300XXS Contusion of lower back and pelvis, sequela: Secondary | ICD-10-CM | POA: Diagnosis not present

## 2018-07-27 DIAGNOSIS — S39012S Strain of muscle, fascia and tendon of lower back, sequela: Secondary | ICD-10-CM

## 2018-07-27 DIAGNOSIS — Z981 Arthrodesis status: Secondary | ICD-10-CM

## 2018-07-27 DIAGNOSIS — M4326 Fusion of spine, lumbar region: Secondary | ICD-10-CM

## 2018-07-27 MED ORDER — METAXALONE 800 MG PO TABS: 800.0000 mg | ORAL_TABLET | Freq: Three times a day (TID) | ORAL | 0 refills | Status: DC

## 2018-07-27 MED ORDER — DICLOFENAC SODIUM 50 MG PO TBEC: 50.0000 mg | DELAYED_RELEASE_TABLET | Freq: Four times a day (QID) | ORAL | 2 refills | Status: DC

## 2018-07-27 NOTE — Progress Notes (Signed)
Office Visit Note   Patient: Joel Galloway           Date of Birth: 1961-04-17           MRN: [id number] Visit Date: 07/27/2018              Requested by: [name], MD [address] [place], [place] [address] PCP: [name], MD   Assessment & Plan: Visit Diagnoses:  1. S/P cervical spinal fusion   2. Fusion of lumbar spine   3. Lumbar contusion, sequela   4. Strain of lumbar paraspinous muscle, sequela     Plan: Avoid frequent bending and stooping  No lifting greater than 10 lbs. May use ice or moist heat for pain. Weight loss is of benefit. Best medication for lumbar disc disease is arthritis medications like diclofenac. Exercise is important to improve your indurance and does allow people to function better inspite of back pain.  Avoid overhead lifting and overhead use of the arms. Do not lift greater than 5 lbs. Adjust head rest in vehicle to prevent hyperextension if rear ended. Take extra precautions to avoid falling. Call Dr. Hardin Negus and he may increase meds for a short period up to one month. Increase diclofenac to 50 mg TID for 2-3 weeks.   Skelaxin for muscular strain and injury pain. Ice is best for the first 3-4 days post acute injury. Heat is good now, moist heat, hot shower or compresses. I expect this pain to improve over the next 3-4 weeks due to muscle and ligament healing.     Follow-Up Instructions: Return in about 4 weeks (around 08/24/2018) for keep appointment already scheduled..   Orders:  Orders Placed This Encounter  Procedures  . XR Lumbar Spine 2-3 Views  . XR Cervical Spine 2 or 3 views   Meds ordered this encounter  Medications  . metaxalone (SKELAXIN) 800 MG tablet    Sig: Take 1 tablet (800 mg total) by mouth 3 (three) times daily.    Dispense:  40 tablet    Refill:  0  . diclofenac (VOLTAREN) 50 MG EC tablet    Sig: Take 1 tablet (50 mg total) by mouth 4 (four) times daily.    Dispense:  120 tablet    Refill:  2        Procedures: No procedures performed   Clinical Data: No additional findings.   Subjective: Chief Complaint  Patient presents with  . Neck - Injury, Pain  . Lower Back - Injury, Pain    58 year old male with history of cervical corpectomy C6 and ACDF C5 to C7 followed by posterior fusion with posterior rods and screws And anterior plate and screws. He has had a lumbar decompression and fusion L5-S1 in 12/2012. He has been doing well reportedly only needing an occasional aspirin for his back when he was having some work done at his home and was writing on a paper under acabinet that had been placed when the 75 lb cabinet fell onto his neck and mid and lower back. He reports that he had a scalp abrasion laceration and had a period of seeing stars but did not lose consciousness. He reports a bruise over his mid back and pain in the lower back. The cabinet incident occurred 07/07/2018 and he report seeing Dr. Ninfa Linden on 2/24 and had gel placed in his knees and reports that he mentioned the incident and was told to follow up with me today. He  reports his on his way up [place] this week on vacation. NO bowel or bladder difficulty. He has pain with lying on the sides, it hurts even to sit in the chair. Pain is not improving since the incident, but it is not getting worse. I'm afraid it may have hurt the vertebrae that is getting near needing surgery.    Review of Systems  Constitutional: Positive for unexpected weight change. Negative for activity change, appetite change, chills, diaphoresis, fatigue and fever.  HENT: Negative.  Negative for congestion, dental problem, drooling, ear discharge, ear pain, facial swelling, hearing loss, mouth sores, nosebleeds, postnasal drip, rhinorrhea, sinus pressure, sinus pain, sneezing, sore throat, tinnitus, trouble swallowing and voice change.   Eyes: Negative.  Negative for photophobia, pain, discharge, redness, itching and visual disturbance.   Respiratory: Negative.  Negative for apnea, cough, choking, chest tightness, shortness of breath, wheezing and stridor.   Cardiovascular: Negative.  Negative for chest pain, palpitations and leg swelling.  Gastrointestinal: Negative.  Negative for abdominal distention, abdominal pain, anal bleeding, blood in stool, constipation, diarrhea, nausea, rectal pain and vomiting.  Endocrine: Negative.  Negative for cold intolerance, heat intolerance, polydipsia, polyphagia and polyuria.  Genitourinary: Negative.  Negative for difficulty urinating, dysuria, enuresis, flank pain, frequency, genital sores and hematuria.  Musculoskeletal: Positive for back pain and neck pain. Negative for arthralgias, gait problem, joint swelling, myalgias and neck stiffness.  Skin: Positive for wound. Negative for color change, pallor and rash.  Allergic/Immunologic: Negative.  Negative for environmental allergies, food allergies and immunocompromised state.  Neurological: Positive for headaches. Negative for dizziness, tremors, seizures, syncope, facial asymmetry, speech difficulty, weakness, light-headedness and numbness.  Hematological: Negative.  Negative for adenopathy. Does not bruise/bleed easily.  Psychiatric/Behavioral: Negative.  Negative for agitation, behavioral problems, confusion, decreased concentration, dysphoric mood, hallucinations, self-injury, sleep disturbance and suicidal ideas. The patient is not nervous/anxious and is not hyperactive.      Objective: Vital Signs: BP 106/71 (BP Location: Left Arm, Patient Position: Sitting)   Pulse 66   Ht 6' (1.829 m)   Wt 195 lb (88.5 kg)   BMI 26.45 kg/m   Physical Exam Constitutional:      Appearance: He is well-developed.  HENT:     Head: Normocephalic and atraumatic.  Eyes:     Pupils: Pupils are equal, round, and reactive to light.  Neck:     Musculoskeletal: Normal range of motion and neck supple.  Pulmonary:     Effort: Pulmonary effort is  normal.     Breath sounds: Normal breath sounds.  Abdominal:     General: Bowel sounds are normal.     Palpations: Abdomen is soft.  Skin:    General: Skin is warm and dry.  Neurological:     Mental Status: He is alert and oriented to person, place, and time.  Psychiatric:        Behavior: Behavior normal.        Thought Content: Thought content normal.        Judgment: Judgment normal.     Back Exam   Tenderness  The patient is experiencing tenderness in the lumbar.  Range of Motion  Extension:  10 abnormal  Flexion: normal  Lateral bend right: abnormal  Lateral bend left: abnormal  Rotation right: normal  Rotation left: normal   Muscle Strength  Right Quadriceps:  5/5  Left Quadriceps:  5/5  Right Hamstrings:  5/5  Left Hamstrings:  5/5   Tests  Straight leg raise right: negative  Straight leg raise left: negative  Reflexes  Patellar: 2/4 Achilles: 0/4 Babinski's sign: normal   Other  Toe walk: normal Heel walk: normal Sensation: normal Gait: normal  Erythema: no back redness Scars: present      Specialty Comments:  No specialty comments available.  Imaging: Xr Cervical Spine 2 Or 3 Views  Result Date: 07/27/2018 AP and lateral flexion and extension radiographs of the cervical spine demonstrate Posterior instrumentatin C5 to C7 with anterior fusion, measurements in flexion and extension show no motion across the used segments there is motion at the C3-4 level which is compensatory anterolisthesis to 3 mm from 57mm.  There is lordosis of the upper cervical segments C1 to C5 some straightening through the fusion segment.  Xr Lumbar Spine 2-3 Views  Result Date: 07/27/2018 AP, lateral flexion and extension radiographs of the lumbar show TLIF L5-S1 solid with posterior instrumentation intact. There is degenerative narrowing of the L4-5 disc which appears worse than last radiographed in 2018. The L3-4 disc is same height as L2-3 and this is unchanged but  there is anterior spurs apparent.    PMFS History: Patient Active Problem List   Diagnosis Date Noted  . 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 Other spondylosis with radiculopathy, cervical region 10/09/2017    Priority: High    Class: Chronic  . Lumbar degenerative disc disease 01/12/2013    Priority: High    Class: Chronic  . HNP (herniated nucleus pulposus), lumbar 01/12/2013    Priority: High    Class: Chronic  . Spinal stenosis, lumbar region, with neurogenic claudication 01/12/2013    Priority: High    Class: Chronic  . S/P cervical spinal fusion 01/20/2017  . Memory difficulties 10/14/2016  . Tachycardia 07/12/2016  . Chest pain 07/12/2016  . Polysubstance abuse ([place]) 07/12/2016  . SIRS (systemic inflammatory response syndrome) ([place]) 07/12/2016  . Lactic acidosis 07/12/2016  . Anxiety state 07/12/2016  . Abnormal brain MRI 07/08/2016  . Chronic pain syndrome 07/08/2016  . Chronic migraine w/o aura w/o status migrainosus, not intractable 07/08/2016  . History of optic neuritis 06/02/2016  . Numbness 06/02/2016  . Gait disturbance 06/02/2016  . Urinary frequency 06/02/2016  . Other fatigue 06/02/2016  . Leg cramp 06/02/2016  . Pain in right hand 05/26/2016  . Chronic bilateral low back pain 05/26/2016  . Closed fracture of body of sternum 05/26/2016  . Nail bed injury right middle finger 07/13/2014   Past Medical History:  Diagnosis Date  . Anxiety   . Arthritis   . Asthma   . Cancer ([place])    skin   . Chronic kidney disease    AA      . Claustrophobia   . Complication of anesthesia    pt states he is unable to tol. foleys,due to anatomy  . Coronary artery disease   . Headache    pt reports migraines  . Hypoglycemia    las t episode 12/24/12  . Multiple sclerosis ([place])   . 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 Seasonal allergies   . Seizures ([place])   . Sleep apnea    Pt reports "mild sleep  apnea"  no cpap  . Vision abnormalities     Family History  Problem Relation Age of Onset  . Hypertension Mother   . Obesity Mother   . Diabetes Mother   . Anxiety disorder Mother   .  Ataxia Neg Hx   . Chorea Neg Hx   . Dementia Neg Hx   . Mental retardation Neg Hx   . Migraines Neg Hx   . Multiple sclerosis Neg Hx   . Neurofibromatosis Neg Hx   . Neuropathy Neg Hx   . Parkinsonism Neg Hx   . Seizures Neg Hx   . Stroke Neg Hx     Past Surgical History:  Procedure Laterality Date  . ANTERIOR CERVICAL DECOMP/DISCECTOMY FUSION N/A 01/20/2017   Procedure: C6 CORPECTOMY WITH BILATERAL FORAMINOTOMIES C5-6 AND C6-7, FUSION C5-7 ANTERIORLY WITH STRUT ALLOGRAFT AND LOCAL BONE GRAFT, ANTERIOR CERVICAL PLATE AND SCREWS;  Surgeon: Jessy Oto, MD;  Location: [facility];  Service: Orthopedics;  Laterality: N/A;  . BACK SURGERY    . HEMATOMA EVACUATION Right 07/13/2014   Procedure: EVACUATION HEMATOMA RIGHT MIDDLE FINGER;  Surgeon: Mcarthur Rossetti, MD;  Location: [facility];  Service: Orthopedics;  Laterality: Right;  . KNEE SURGERY Left    x2  arthroscopy  . NOSE SURGERY    . POSTERIOR CERVICAL FUSION/FORAMINOTOMY N/A 10/09/2017   Procedure: POSTERIOR CERVICAL FUSION C5-6 AND C6-7 WITH POSTERIOR LATERAL MASS SCREWS AND RODS C5-7, POSTERIOR LEFT C6-7 FORAMINOTOMY, POSTERIOR LEFT ILIAC CREST BONE GRAFT HARVEST;  Surgeon: Jessy Oto, MD;  Location: [facility];  Service: Orthopedics;  Laterality: N/A;  . POSTERIOR FUSION LUMBAR SPINE  01/10/2013   Dr Louanne Skye  . WISDOM TOOTH EXTRACTION     Social History   Occupational History  . Not on file  Tobacco Use  . Smoking status: Never Smoker  . Smokeless tobacco: Never Used  Substance and Sexual Activity  . Alcohol use: No  . Drug use: No  . Sexual activity: Not on file

## 2018-07-27 NOTE — Patient Instructions (Signed)
Plan: Avoid frequent bending and stooping  No lifting greater than 10 lbs. May use ice or moist heat for pain. Weight loss is of benefit. Best medication for lumbar disc disease is arthritis medications like diclofenac. Exercise is important to improve your indurance and does allow people to function better inspite of back pain.  Avoid overhead lifting and overhead use of the arms. Do not lift greater than 5 lbs. Adjust head rest in vehicle to prevent hyperextension if rear ended. Take extra precautions to avoid falling. Call Dr. Hardin Negus and he may increase meds for a short period up to one month. Increase diclofenac to 50 mg TID for 2-3 weeks.   Skelaxin for muscular strain and injury pain. Ice is best for the first 3-4 days post acute injury. Heat is good now, moist heat, hot shower or compresses. I expect this pain to improve over the next 3-4 weeks due to muscle and ligament healing.

## 2018-08-20 ENCOUNTER — Telehealth (INDEPENDENT_AMBULATORY_CARE_PROVIDER_SITE_OTHER): Payer: Self-pay

## 2018-08-20 NOTE — Telephone Encounter (Signed)
Patient states gel inj helped a lot. He is feeling fine and would like to Cx appt for Monday. FYI Appt Cx.

## 2018-08-23 ENCOUNTER — Ambulatory Visit (INDEPENDENT_AMBULATORY_CARE_PROVIDER_SITE_OTHER): Payer: Medicare Other | Admitting: Orthopaedic Surgery

## 2018-08-24 ENCOUNTER — Encounter: Payer: Self-pay | Admitting: Cardiology

## 2018-08-24 ENCOUNTER — Ambulatory Visit: Payer: Self-pay | Admitting: Cardiology

## 2018-08-24 DIAGNOSIS — R000 Tachycardia, unspecified: Secondary | ICD-10-CM

## 2018-08-24 DIAGNOSIS — I4711 Inappropriate sinus tachycardia, so stated: Secondary | ICD-10-CM | POA: Insufficient documentation

## 2018-08-24 HISTORY — DX: Inappropriate sinus tachycardia, so stated: I47.11

## 2018-08-24 HISTORY — DX: Tachycardia, unspecified: R00.0
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
## 2018-09-09 ENCOUNTER — Ambulatory Visit (INDEPENDENT_AMBULATORY_CARE_PROVIDER_SITE_OTHER): Payer: Medicare Other | Admitting: Specialist

## 2018-11-04 ENCOUNTER — Telehealth: Payer: Self-pay | Admitting: *Deleted

## 2018-11-04 ENCOUNTER — Other Ambulatory Visit (INDEPENDENT_AMBULATORY_CARE_PROVIDER_SITE_OTHER): Payer: Self-pay | Admitting: Specialist

## 2018-11-04 NOTE — Telephone Encounter (Signed)
Patient has appt on 11/24/18 with Dr. Louanne Skye

## 2018-11-04 NOTE — Telephone Encounter (Signed)
He needs to come in for nurse visit to check labs.  Needs CBC and CMET for long-term anti-inflammatory use.  Patient has history of chronic kidney disease.

## 2018-11-04 NOTE — Telephone Encounter (Signed)
Patient will stop by [facility] on Monday to p/u MRi cd.

## 2018-11-04 NOTE — Telephone Encounter (Signed)
Diclofenac refill request 

## 2018-11-07 ENCOUNTER — Other Ambulatory Visit (INDEPENDENT_AMBULATORY_CARE_PROVIDER_SITE_OTHER): Payer: Self-pay | Admitting: Specialist

## 2018-11-08 NOTE — Telephone Encounter (Signed)
Diclofenac refill request 

## 2018-11-09 NOTE — Telephone Encounter (Signed)
Ok. Can check labs at that time.

## 2018-11-11 ENCOUNTER — Other Ambulatory Visit: Payer: Self-pay | Admitting: Cardiology

## 2018-11-12 ENCOUNTER — Other Ambulatory Visit: Payer: Self-pay

## 2018-11-12 MED ORDER — BISOPROLOL FUMARATE 10 MG PO TABS: 10.0000 mg | ORAL_TABLET | Freq: Two times a day (BID) | ORAL | 0 refills | Status: DC

## 2018-11-12 MED ORDER — BISOPROLOL FUMARATE 10 MG PO TABS: 10.0000 mg | ORAL_TABLET | Freq: Two times a day (BID) | ORAL | Status: DC

## 2018-11-24 ENCOUNTER — Ambulatory Visit (INDEPENDENT_AMBULATORY_CARE_PROVIDER_SITE_OTHER): Payer: 59

## 2018-11-24 ENCOUNTER — Encounter: Payer: Self-pay | Admitting: Specialist

## 2018-11-24 ENCOUNTER — Other Ambulatory Visit: Payer: Self-pay

## 2018-11-24 ENCOUNTER — Ambulatory Visit: Payer: Self-pay

## 2018-11-24 ENCOUNTER — Ambulatory Visit (INDEPENDENT_AMBULATORY_CARE_PROVIDER_SITE_OTHER): Payer: 59 | Admitting: Specialist

## 2018-11-24 VITALS — BP 139/87 | HR 60 | Ht 72.0 in | Wt 195.0 lb

## 2018-11-24 DIAGNOSIS — M542 Cervicalgia: Secondary | ICD-10-CM | POA: Diagnosis not present

## 2018-11-24 DIAGNOSIS — M4326 Fusion of spine, lumbar region: Secondary | ICD-10-CM

## 2018-11-24 DIAGNOSIS — Z981 Arthrodesis status: Secondary | ICD-10-CM

## 2018-11-24 DIAGNOSIS — T8484XA Pain due to internal orthopedic prosthetic devices, implants and grafts, initial encounter: Secondary | ICD-10-CM | POA: Diagnosis not present

## 2018-11-24 DIAGNOSIS — M48062 Spinal stenosis, lumbar region with neurogenic claudication: Secondary | ICD-10-CM

## 2018-11-24 DIAGNOSIS — M5136 Other intervertebral disc degeneration, lumbar region: Secondary | ICD-10-CM | POA: Diagnosis not present

## 2018-11-24 NOTE — Patient Instructions (Signed)
Avoid overhead lifting and overhead use of the arms. Do not lift greater than 5 lbs. Adjust head rest in vehicle to prevent hyperextension if rear ended. Take extra precautions to avoid falling. Avoid bending, stooping and avoid lifting weights greater than 10 lbs. Avoid prolong standing and walking. Avoid frequent bending and stooping  No lifting greater than 10 lbs. May use ice or moist heat for pain. Weight loss is of benefit. Handicap license is approved. Myelogram and post myelogram CT Scan of the lumbar and cervical. You have painful prominent posterior cervical hardware, rods and screws and removal may be of benefit in diminishing your pain the soft tissue envelop may return to place or may have so atrophy making a plastic surgical procedure necessary. I will enlist the help of a plastic surgeon to evaluate and help with soft tissue  Mobilization and flap if necessary. Dr. Lyndee Leo Sanger Dillingham, DO will be consulted.

## 2018-11-24 NOTE — Progress Notes (Signed)
Office Visit Note   Patient: Joel Galloway           Date of Birth: 1961-02-06           MRN: [id number] Visit Date: 11/24/2018              Requested by: [name], MD [place],  [place] [address] PCP: [name], MD   Assessment & Plan: Visit Diagnoses:  1. S/P cervical spinal fusion   2. Fusion of lumbar spine   3. Painful orthopaedic hardware ([place])   4. Lumbar degenerative disc disease   5. Spinal stenosis, lumbar region, with neurogenic claudication     Plan: Avoid overhead lifting and overhead use of the arms. Do not lift greater than 5 lbs. Adjust head rest in vehicle to prevent hyperextension if rear ended. Take extra precautions to avoid falling. Avoid bending, stooping and avoid lifting weights greater than 10 lbs. Avoid prolong standing and walking. Avoid frequent bending and stooping  No lifting greater than 10 lbs. May use ice or moist heat for pain. Weight loss is of benefit. Handicap license is approved. Myelogram and post myelogram CT Scan of the lumbar and cervical. You have painful prominent posterior cervical hardware, rods and screws and removal may be of benefit in diminishing your pain the soft tissue envelop may return to place or may have so atrophy making a plastic surgical procedure necessary. I will enlist the help of a plastic surgeon to evaluate and help with soft tissue  Mobilization and flap if necessary. Dr. Lyndee Leo Sanger Dillingham, DO will be consulted.     Follow-Up Instructions: Return in about 3 weeks (around 12/15/2018).   Orders:  Orders Placed This Encounter  Procedures  . XR Cervical Spine 2 or 3 views  . XR Lumbar Spine 2-3 Views   No orders of the defined types were placed in this encounter.     Procedures: No procedures performed   Clinical Data: No additional findings.   Subjective: Chief Complaint  Patient presents with  . Neck - Pain  . Lower Back - Pain    58 year old male  right hand dominant, reports he is ambidextrous. He underwent ADCF at the 01/20/2017, Corpectomy at the C6 level with plate and screws. He had persistence of motion at the corpectomy site and had posterior arthrodesis at C5to C7 in 09/2017. He reports prominence of the posterior hardware and as seen Dr. Nicholaus Bloom and he reports that the posterior instrumentation is prominent. Walking varies 500' to occasionally up to 2  [place]. The pain is in the back and worse with flexion. The great toes are numb on the outside and inside both sides.  There is stiffness in the neck and low back.   Review of Systems  Constitutional: Negative for activity change, appetite change, chills, diaphoresis, fatigue, fever and unexpected weight change.  HENT: Negative.   Eyes: Negative.   Respiratory: Positive for shortness of breath and wheezing.   Cardiovascular: Negative.   Gastrointestinal: Negative.   Endocrine: Negative.   Genitourinary: Negative.   Musculoskeletal: Positive for back pain, neck pain and neck stiffness. Negative for arthralgias, gait problem, joint swelling and myalgias.  Allergic/Immunologic: Negative.   Neurological: Positive for weakness and numbness.  Hematological: Negative.   Psychiatric/Behavioral: Negative for agitation, behavioral problems, confusion, decreased concentration, dysphoric mood, hallucinations, self-injury, sleep disturbance and suicidal ideas. The patient is not nervous/anxious and is not hyperactive.  Objective: Vital Signs: BP 139/87 (BP Location: Left Arm, Patient Position: Sitting)   Pulse 60   Ht 6' (1.829 m)   Wt 195 lb (88.5 kg)   BMI 26.45 kg/m   Physical Exam Constitutional:      Appearance: He is well-developed.  HENT:     Head: Normocephalic and atraumatic.  Eyes:     Pupils: Pupils are equal, round, and reactive to light.  Neck:     Musculoskeletal: Normal range of motion. Neck rigidity present.  Pulmonary:     Effort: Pulmonary effort is  normal.     Breath sounds: Normal breath sounds.  Abdominal:     General: Bowel sounds are normal.     Palpations: Abdomen is soft.  Skin:    General: Skin is warm and dry.  Neurological:     Mental Status: He is alert and oriented to person, place, and time.  Psychiatric:        Behavior: Behavior normal.        Thought Content: Thought content normal.        Judgment: Judgment normal.     Back Exam   Tenderness  The patient is experiencing tenderness in the cervical and lumbar.  Range of Motion  Extension:  70 abnormal  Flexion: 70  Lateral bend right:  70 abnormal  Lateral bend left:  70 abnormal  Rotation right: 70  Rotation left: 70   Muscle Strength  Right Quadriceps:  5/5  Left Quadriceps:  5/5  Right Hamstrings:  5/5  Left Hamstrings:  5/5   Reflexes  Patellar: 2/4 Achilles: 0/4 Biceps: 2/4  Other  Toe walk: normal Heel walk: normal Sensation: decreased Erythema: no back redness Scars: present  Comments:  Weak right EHL c/w left. Prominent posterior elements and hardware that is tender to palpation with spread of the ligamentum nuche.       Specialty Comments:  No specialty comments available.  Imaging: Xr Cervical Spine 2 Or 3 Views  Result Date: 11/24/2018 AP and lateral flexion and extension radiographs show anterior plate C5-C7 with posterior cervical rods and lateral mass screws. With flexion and extension only 0.2 mm of movement between similar points at the posterior superior spinous processes C5 to C7. Mild kyphosis at the C4-5 level with mild DDD.   Xr Lumbar Spine 2-3 Views  Result Date: 11/24/2018 AP and lateral flexion and extension radiographs show TLIF L5-S1 with posterior rods and pedicle screws. With flexion and extension there is no movement between similar points at the posterior . There is mild DDD L2-3, L3-4 and L4-5.There is a left lumbar scoliosis that is low degree.     PMFS History: Patient Active Problem List    Diagnosis Date Noted  . 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 Other spondylosis with radiculopathy, cervical region 10/09/2017    Priority: High    Class: Chronic  . Lumbar degenerative disc disease 01/12/2013    Priority: High    Class: Chronic  . HNP (herniated nucleus pulposus), lumbar 01/12/2013    Priority: High    Class: Chronic  . Spinal stenosis, lumbar region, with neurogenic claudication 01/12/2013    Priority: High    Class: Chronic  . 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 S/P cervical spinal fusion 01/20/2017  . Memory difficulties 10/14/2016  . Tachycardia 07/12/2016  . Chest pain 07/12/2016  . Polysubstance abuse ([place]) 07/12/2016  . SIRS (systemic inflammatory response  syndrome) ([place]) 07/12/2016  . Lactic acidosis 07/12/2016  . Anxiety state 07/12/2016  . Abnormal brain MRI 07/08/2016  . Chronic pain syndrome 07/08/2016  . Chronic migraine w/o aura w/o status migrainosus, not intractable 07/08/2016  . History of optic neuritis 06/02/2016  . Numbness 06/02/2016  . Gait disturbance 06/02/2016  . Urinary frequency 06/02/2016  . Other fatigue 06/02/2016  . Leg cramp 06/02/2016  . Pain in right hand 05/26/2016  . Chronic bilateral low back pain 05/26/2016  . Closed fracture of body of sternum 05/26/2016  . Nail bed injury right middle finger 07/13/2014   Past Medical History:  Diagnosis Date  . Anxiety   . Arthritis   . Asthma   . Cancer ([place])    skin   . Chronic kidney disease    AA      . Claustrophobia   . Complication of anesthesia    pt states he is unable to tol. foleys,due to anatomy  . Coronary artery disease   . Headache    pt reports migraines  . Hypoglycemia    las t episode 12/24/12  . 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 Multiple sclerosis ([place])   . 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 Seasonal allergies   . Seizures ([place])   . Sleep apnea    Pt reports "mild sleep  apnea"  no cpap  . Vision abnormalities     Family History  Problem Relation Age of Onset  . Hypertension Mother   . Obesity Mother   . Diabetes Mother   . Anxiety disorder Mother   . Ataxia Neg Hx   . Chorea Neg Hx   . Dementia Neg Hx   . Mental retardation Neg Hx   . Migraines Neg Hx   . Multiple sclerosis Neg Hx   . Neurofibromatosis Neg Hx   . Neuropathy Neg Hx   . Parkinsonism Neg Hx   . Seizures Neg Hx   . Stroke Neg Hx     Past Surgical History:  Procedure Laterality Date  . ANTERIOR CERVICAL DECOMP/DISCECTOMY FUSION N/A 01/20/2017   Procedure: C6 CORPECTOMY WITH BILATERAL FORAMINOTOMIES C5-6 AND C6-7, FUSION C5-7 ANTERIORLY WITH STRUT ALLOGRAFT AND LOCAL BONE GRAFT, ANTERIOR CERVICAL PLATE AND SCREWS;  Surgeon: Jessy Oto, MD;  Location: [facility];  Service: Orthopedics;  Laterality: N/A;  . BACK SURGERY    . HEMATOMA EVACUATION Right 07/13/2014   Procedure: EVACUATION HEMATOMA RIGHT MIDDLE FINGER;  Surgeon: Mcarthur Rossetti, MD;  Location: [facility];  Service: Orthopedics;  Laterality: Right;  . KNEE SURGERY Left    x2  arthroscopy  . NOSE SURGERY    . POSTERIOR CERVICAL FUSION/FORAMINOTOMY N/A 10/09/2017   Procedure: POSTERIOR CERVICAL FUSION C5-6 AND C6-7 WITH POSTERIOR LATERAL MASS SCREWS AND RODS C5-7, POSTERIOR LEFT C6-7 FORAMINOTOMY, POSTERIOR LEFT ILIAC CREST BONE GRAFT HARVEST;  Surgeon: Jessy Oto, MD;  Location: [facility];  Service: Orthopedics;  Laterality: N/A;  . POSTERIOR FUSION LUMBAR SPINE  01/10/2013   Dr Louanne Skye  . WISDOM TOOTH EXTRACTION     Social History   Occupational History  . Not on file  Tobacco Use  . Smoking status: Never Smoker  . Smokeless tobacco: Never Used  Substance and Sexual Activity  . Alcohol use: No  . Drug use: No  . Sexual activity: Not on file

## 2018-11-25 ENCOUNTER — Telehealth: Payer: Self-pay | Admitting: Nurse Practitioner

## 2018-11-25 NOTE — Telephone Encounter (Signed)
Phone call to patient to verify medication list and allergies for myelogram procedure. Pt aware he will not need to hold any medications for this procedure. Pre and post procedure instructions reviewed with pt. Pt verbalized understanding.  

## 2018-11-26 ENCOUNTER — Other Ambulatory Visit: Payer: Self-pay | Admitting: Specialist

## 2018-11-26 ENCOUNTER — Telehealth: Payer: Self-pay | Admitting: Specialist

## 2018-11-26 DIAGNOSIS — M5440 Lumbago with sciatica, unspecified side: Secondary | ICD-10-CM

## 2018-11-26 NOTE — Telephone Encounter (Signed)
See below

## 2018-11-26 NOTE — Telephone Encounter (Signed)
Order has been placed for CT lumbar spine with contrast myelogram, it was written in [facility] notes that he was to have both done. Pt will be contacted by Angelita Ingles with [facility] to advise order has been placed and will try to schedule same day.

## 2018-11-26 NOTE — Telephone Encounter (Signed)
Angelita Ingles from [facility] called stated Mr. Sahni refused to come in unless the Lumbar id added to the existing order. Stated he wanted them done both at same time. Cervical and Lumbar.  [phone]

## 2018-12-09 ENCOUNTER — Telehealth: Payer: Self-pay

## 2018-12-09 ENCOUNTER — Other Ambulatory Visit: Payer: Self-pay | Admitting: Specialist

## 2018-12-09 NOTE — Telephone Encounter (Signed)
Printed note and gave to Dr. Louanne Skye

## 2018-12-09 NOTE — Telephone Encounter (Signed)
Patient called back and provided the number for Dr. Louanne Skye to call back to answer some questions for peer to peer today.  Cb# is [phone].  Please advise.  Thank you.

## 2018-12-10 ENCOUNTER — Ambulatory Visit
Admission: RE | Admit: 2018-12-10 | Discharge: 2018-12-10 | Disposition: A | Discharge status: Home / Self Care | Payer: 59 | Source: Ambulatory Visit | Attending: Specialist | Admitting: Specialist

## 2018-12-10 ENCOUNTER — Other Ambulatory Visit: Payer: 59

## 2018-12-10 ENCOUNTER — Other Ambulatory Visit: Payer: Self-pay | Admitting: Radiology

## 2018-12-10 DIAGNOSIS — Z981 Arthrodesis status: Secondary | ICD-10-CM

## 2018-12-10 DIAGNOSIS — M542 Cervicalgia: Secondary | ICD-10-CM

## 2018-12-10 DIAGNOSIS — M5440 Lumbago with sciatica, unspecified side: Secondary | ICD-10-CM

## 2018-12-10 MED ORDER — DIAZEPAM 5 MG PO TABS
5.0000 mg | ORAL_TABLET | Freq: Once | ORAL | Status: AC
  Administered 2018-12-10: 10:00:00 5 mg via ORAL

## 2018-12-10 MED ORDER — ONDANSETRON HCL 4 MG/2ML IJ SOLN
4.0000 mg | Freq: Once | INTRAMUSCULAR | Status: AC
  Administered 2018-12-10: 11:00:00 4 mg via INTRAMUSCULAR

## 2018-12-10 MED ORDER — MEPERIDINE HCL 50 MG/ML IJ SOLN
50.0000 mg | Freq: Once | INTRAMUSCULAR | Status: AC
  Administered 2018-12-10: 50 mg via INTRAMUSCULAR

## 2018-12-10 MED ORDER — IOPAMIDOL (ISOVUE-M 300) INJECTION 61%
10.0000 mL | Freq: Once | INTRAMUSCULAR | Status: AC
  Administered 2018-12-10: 11:00:00 10 mL via INTRATHECAL

## 2018-12-10 NOTE — Progress Notes (Signed)
Pt c/o "pressure" like pain in neck and back from myelogram procedure. Pain 8/10. Dr. Nelia Shi notified, see new orders.

## 2018-12-10 NOTE — Discharge Instructions (Signed)

## 2018-12-13 ENCOUNTER — Telehealth: Payer: Self-pay

## 2018-12-24 ENCOUNTER — Ambulatory Visit (INDEPENDENT_AMBULATORY_CARE_PROVIDER_SITE_OTHER): Payer: 59 | Admitting: Specialist

## 2018-12-24 ENCOUNTER — Encounter: Payer: Self-pay | Admitting: Specialist

## 2018-12-24 VITALS — BP 110/71 | HR 63 | Ht 72.0 in | Wt 195.0 lb

## 2018-12-24 DIAGNOSIS — M5136 Other intervertebral disc degeneration, lumbar region: Secondary | ICD-10-CM | POA: Diagnosis not present

## 2018-12-24 DIAGNOSIS — T8484XA Pain due to internal orthopedic prosthetic devices, implants and grafts, initial encounter: Secondary | ICD-10-CM

## 2018-12-24 DIAGNOSIS — M5126 Other intervertebral disc displacement, lumbar region: Secondary | ICD-10-CM

## 2018-12-24 DIAGNOSIS — M47812 Spondylosis without myelopathy or radiculopathy, cervical region: Secondary | ICD-10-CM | POA: Diagnosis not present

## 2018-12-24 DIAGNOSIS — M5116 Intervertebral disc disorders with radiculopathy, lumbar region: Secondary | ICD-10-CM

## 2018-12-24 DIAGNOSIS — M4322 Fusion of spine, cervical region: Secondary | ICD-10-CM | POA: Diagnosis not present

## 2018-12-24 DIAGNOSIS — M48062 Spinal stenosis, lumbar region with neurogenic claudication: Secondary | ICD-10-CM

## 2018-12-24 NOTE — Patient Instructions (Signed)
Avoid bending, stooping and avoid lifting weights greater than 10 lbs. Avoid prolong standing and walking. Avoid frequent bending and stooping  No lifting greater than 10 lbs. May use ice or moist heat for pain. Weight loss is of benefit. Handicap license is approved. Avoid overhead lifting and overhead use of the arms. Do not lift greater than 5 lbs. Adjust head rest in vehicle to prevent hyperextension if rear ended. Take extra precautions to avoid falling, including use of a cane if you feel weak. Scheduling secretary Kandice Hams. will call you to arrange for surgery for your cervical spine. If you wish a second opinion please let us know and we can arrange for you. If you have worsening arm or leg numbness or weakness please call or go to an ER. We will contact your cardiologist and primary care physicians to seek clearance for your surgery. Surgery will be removal of hardware C5 to C7 posteriorly with soft tissue reconstruction of the muscles and ligaments to improve the coverage of the spine and spinous processes posteriorly.   Risks of surgery include risks of infection, bleeding and risks to the spinal cord with is minimal and   Surgery is indicated due to spread of posterior soft tissue with painful posterior spinous processes and decreased muscle function of the cervical spine with uncovering of the hardware, making it prominent and painful. In the future surgery at adjacent levels may be necessary but these levels do not appear to be related to your current symptoms or signs. Following a period of 6 weeks we will consider performing lumbar decompression and extending the fusion to L3 from L5 with left sided TLIFs. This for spinal stenosis and degenerative disc disease.

## 2018-12-24 NOTE — Progress Notes (Signed)
Office Visit Note   Patient: Joel Galloway           Date of Birth: [birth year]           MRN: [id number] Visit Date: 12/24/2018              Requested by: [name], MD [place],  [place] [address] PCP: [name], MD   Assessment & Plan: Visit Diagnoses:  1. Spondylosis without myelopathy or radiculopathy, cervical region   2. Fusion of spine of cervical region   3. Painful orthopaedic hardware ([place])   4. Lumbar degenerative disc disease   5. Spinal stenosis, lumbar region, with neurogenic claudication   6. Herniation of nucleus pulposus of lumbar intervertebral disc with sciatica     Plan: Avoid bending, stooping and avoid lifting weights greater than 10 lbs. Avoid prolong standing and walking. Avoid frequent bending and stooping  No lifting greater than 10 lbs. May use ice or moist heat for pain. Weight loss is of benefit. Handicap license is approved. Avoid overhead lifting and overhead use of the arms. Do not lift greater than 5 lbs. Adjust head rest in vehicle to prevent hyperextension if rear ended. Take extra precautions to avoid falling, including use of a cane if you feel weak. Scheduling secretary Kandice Hams. will call you to arrange for surgery for your cervical spine. If you wish a second opinion please let us know and we can arrange for you. If you have worsening arm or leg numbness or weakness please call or go to an ER. We will contact your cardiologist and primary care physicians to seek clearance for your surgery. Surgery will be removal of hardware C5 to C7 posteriorly with soft tissue reconstruction of the muscles and ligaments to improve the coverage of the spine and spinous processes posteriorly.   Risks of surgery include risks of infection, bleeding and risks to the spinal cord with is minimal and   Surgery is indicated due to spread of posterior soft tissue with painful posterior spinous processes and decreased muscle  function of the cervical spine with uncovering of the hardware, making it prominent and painful. In the future surgery at adjacent levels may be necessary but these levels do not appear to be related to your current symptoms or signs. Following a period of 6 weeks we will consider performing lumbar decompression and extending the fusion to L3 from L5 with left sided TLIFs. This for spinal stenosis and degenerative disc disease.   Follow-Up Instructions: Return in about 4 weeks (around 01/21/2019).   Orders:  No orders of the defined types were placed in this encounter.  No orders of the defined types were placed in this encounter.     Procedures: No procedures performed   Clinical Data: No additional findings.   Subjective: Chief Complaint  Patient presents with  . Neck - Follow-up  . Lower Back - Follow-up    58 year old right handed male with history of C6 corpectomy and C5-6 and C6-7 ACDF went onto a nonunion at C6-7 and posterior instrumented fusion C5-C with lateral mass screws and rods. He has increasing posterior neck pain with prominence of the lateral mass hardware and paracervical muscle atrophy and dehiscence of the ligamentum nuche causing prominence of the Retained hardware and pain. He also has L5-S1 fusion with adjacent level DDD L3-4 and L4-5 with a mild lumbar scoliosis. He has difficulty standing for longer than 5 minutes and walking is  painful intermittantly. He underwent CT myelogram of the cervical and lujmbar spine to assess these areas in preop evaluation prior to removal of the cervical hardware. No bowel or bladder difficulty.   Review of Systems  Constitutional: Negative.   HENT: Negative.   Eyes: Negative.   Respiratory: Negative.   Cardiovascular: Negative.   Gastrointestinal: Negative.   Endocrine: Negative.   Genitourinary: Negative.   Musculoskeletal: Negative.   Skin: Negative.   Allergic/Immunologic: Negative.   Neurological: Negative.    Hematological: Negative.   Psychiatric/Behavioral: Negative.      Objective: Vital Signs: BP 110/71 (BP Location: Left Arm, Patient Position: Sitting, Cuff Size: Normal)   Pulse 63   Ht 6' (1.829 m)   Wt 195 lb (88.5 kg)   BMI 26.45 kg/m   Physical Exam Constitutional:      Appearance: He is well-developed.  HENT:     Head: Normocephalic and atraumatic.  Eyes:     Pupils: Pupils are equal, round, and reactive to light.  Neck:     Musculoskeletal: Normal range of motion and neck supple.  Pulmonary:     Effort: Pulmonary effort is normal.     Breath sounds: Normal breath sounds.  Abdominal:     General: Bowel sounds are normal.     Palpations: Abdomen is soft.  Skin:    General: Skin is warm and dry.  Neurological:     Mental Status: He is alert and oriented to person, place, and time.  Psychiatric:        Behavior: Behavior normal.        Thought Content: Thought content normal.        Judgment: Judgment normal.     Back Exam   Tenderness  The patient is experiencing tenderness in the cervical and lumbar.  Range of Motion  Extension: abnormal  Flexion: abnormal  Lateral bend right: normal  Lateral bend left: normal  Rotation right: normal  Rotation left: normal   Muscle Strength  Right Quadriceps:  5/5  Left Quadriceps:  5/5  Right Hamstrings:  5/5  Left Hamstrings:  5/5   Tests  Straight leg raise right: negative Straight leg raise left: negative  Other  Toe walk: normal Heel walk: normal Sensation: normal Gait: normal  Erythema: no back redness Scars: present  Comments:  Bilateral EHL weakness bilateral 4/5, numbness great toes bilat. Cervical spine posterior prominence of the bilateral lateral mass hardware C5 to C7 with dehiscence of the ligamentum nuche posteriorly C5 to C-7 Soft tissue tenderness.       Specialty Comments:  No specialty comments available.  Imaging: No results found.   PMFS History: Patient Active Problem List    Diagnosis Date Noted  . 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 Other spondylosis with radiculopathy, cervical region 10/09/2017    Priority: High    Class: Chronic  . Lumbar degenerative disc disease 01/12/2013    Priority: High    Class: Chronic  . HNP (herniated nucleus pulposus), lumbar 01/12/2013    Priority: High    Class: Chronic  . Spinal stenosis, lumbar region, with neurogenic claudication 01/12/2013    Priority: High    Class: Chronic  . 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 S/P cervical spinal fusion 01/20/2017  . Memory difficulties 10/14/2016  . Tachycardia 07/12/2016  . Chest pain 07/12/2016  . Polysubstance abuse ([place]) 07/12/2016  . SIRS (systemic inflammatory response syndrome) ([place]) 07/12/2016  .  Lactic acidosis 07/12/2016  . Anxiety state 07/12/2016  . Abnormal brain MRI 07/08/2016  . Chronic pain syndrome 07/08/2016  . Chronic migraine w/o aura w/o status migrainosus, not intractable 07/08/2016  . History of optic neuritis 06/02/2016  . Numbness 06/02/2016  . Gait disturbance 06/02/2016  . Urinary frequency 06/02/2016  . Other fatigue 06/02/2016  . Leg cramp 06/02/2016  . Pain in right hand 05/26/2016  . Chronic bilateral low back pain 05/26/2016  . Closed fracture of body of sternum 05/26/2016  . Nail bed injury right middle finger 07/13/2014   Past Medical History:  Diagnosis Date  . Anxiety   . Arthritis   . Asthma   . Cancer ([place])    skin   . Chronic kidney disease    AA      . Claustrophobia   . Complication of anesthesia    pt states he is unable to tol. foleys,due to anatomy  . Coronary artery disease   . Headache    pt reports migraines  . Hypoglycemia    las t episode 12/24/12  . 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 Multiple sclerosis ([place])   . 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 Seasonal allergies   . Seizures ([place])   . Sleep apnea    Pt reports "mild  sleep apnea"  no cpap  . Vision abnormalities     Family History  Problem Relation Age of Onset  . Hypertension Mother   . Obesity Mother   . Diabetes Mother   . Anxiety disorder Mother   . Ataxia Neg Hx   . Chorea Neg Hx   . Dementia Neg Hx   . Mental retardation Neg Hx   . Migraines Neg Hx   . Multiple sclerosis Neg Hx   . Neurofibromatosis Neg Hx   . Neuropathy Neg Hx   . Parkinsonism Neg Hx   . Seizures Neg Hx   . Stroke Neg Hx     Past Surgical History:  Procedure Laterality Date  . ANTERIOR CERVICAL DECOMP/DISCECTOMY FUSION N/A 01/20/2017   Procedure: C6 CORPECTOMY WITH BILATERAL FORAMINOTOMIES C5-6 AND C6-7, FUSION C5-7 ANTERIORLY WITH STRUT ALLOGRAFT AND LOCAL BONE GRAFT, ANTERIOR CERVICAL PLATE AND SCREWS;  Surgeon: Jessy Oto, MD;  Location: [facility];  Service: Orthopedics;  Laterality: N/A;  . BACK SURGERY    . HEMATOMA EVACUATION Right 07/13/2014   Procedure: EVACUATION HEMATOMA RIGHT MIDDLE FINGER;  Surgeon: Mcarthur Rossetti, MD;  Location: [facility];  Service: Orthopedics;  Laterality: Right;  . KNEE SURGERY Left    x2  arthroscopy  . NOSE SURGERY    . POSTERIOR CERVICAL FUSION/FORAMINOTOMY N/A 10/09/2017   Procedure: POSTERIOR CERVICAL FUSION C5-6 AND C6-7 WITH POSTERIOR LATERAL MASS SCREWS AND RODS C5-7, POSTERIOR LEFT C6-7 FORAMINOTOMY, POSTERIOR LEFT ILIAC CREST BONE GRAFT HARVEST;  Surgeon: Jessy Oto, MD;  Location: [facility];  Service: Orthopedics;  Laterality: N/A;  . POSTERIOR FUSION LUMBAR SPINE  01/10/2013   Dr Louanne Skye  . WISDOM TOOTH EXTRACTION     Social History   Occupational History  . Not on file  Tobacco Use  . Smoking status: Never Smoker  . Smokeless tobacco: Never Used  Substance and Sexual Activity  . Alcohol use: No  . Drug use: No  . Sexual activity: Not on file

## 2019-01-03 ENCOUNTER — Telehealth: Payer: Self-pay

## 2019-01-03 NOTE — Telephone Encounter (Signed)

## 2019-01-04 ENCOUNTER — Ambulatory Visit (INDEPENDENT_AMBULATORY_CARE_PROVIDER_SITE_OTHER): Payer: 59 | Admitting: Plastic Surgery

## 2019-01-04 ENCOUNTER — Other Ambulatory Visit: Payer: Self-pay

## 2019-01-04 ENCOUNTER — Encounter: Payer: Self-pay | Admitting: Plastic Surgery

## 2019-01-04 VITALS — BP 124/78 | HR 64 | Temp 98.4°F | Ht 73.0 in | Wt 183.0 lb

## 2019-01-04 DIAGNOSIS — M48062 Spinal stenosis, lumbar region with neurogenic claudication: Secondary | ICD-10-CM

## 2019-01-04 DIAGNOSIS — R269 Unspecified abnormalities of gait and mobility: Secondary | ICD-10-CM

## 2019-01-04 DIAGNOSIS — Z981 Arthrodesis status: Secondary | ICD-10-CM

## 2019-01-04 NOTE — Progress Notes (Signed)
Patient ID: Joel Galloway, male    DOB: [birth year], 58 y.o.   MRN: [id number]   Chief Complaint  Patient presents with  . Skin Problem    The patient is a 58 year old male here for evaluation of his posterior neck.  The patient has undergone several spinal surgeries in the past.  He underwent cervical and lumbar fusion.  He has neurogenic claudication and lumbar degenerative disc disease.  In May 2019 with a posterior cervical fusion of C5-6 and 6-7 with posterior lateral mass screws and rods at C5-7 and posterior left C6-7.  He had a foraminotomy with posterior left iliac crest bone graft harvest.  The hardware is covered by skin but very prominent underneath and very tender.  The patient and Dr. Louanne Skye are concerned that it is going to protrude through the skin.  The plan is for removal of the hardware and they would like assistance with closure.  The trapezius and posterior cervical muscles are extremely tight they appear to be rotated laterally to each side with no muscle coverage over the spinous processes or hardware.  The patient is getting physical therapy.   Review of Systems  Constitutional: Positive for activity change. Negative for appetite change.  HENT: Negative.  Negative for dental problem.   Eyes: Negative.   Respiratory: Negative.   Gastrointestinal: Negative.  Negative for abdominal pain.  Endocrine: Negative.   Genitourinary: Negative.   Musculoskeletal: Positive for back pain and neck pain.  Neurological: Negative.   Hematological: Negative.   Psychiatric/Behavioral: Negative.     Past Medical History:  Diagnosis Date  . Anxiety   . Arthritis   . Asthma   . Cancer ([place])    skin   . Chronic kidney disease    AA      . Claustrophobia   . Complication of anesthesia    pt states he is unable to tol. foleys,due to anatomy  . Coronary artery disease   . Headache    pt reports migraines  . Hypoglycemia    las t episode 12/24/12  . 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 Multiple sclerosis ([place])   . 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 Seasonal allergies   . Seizures ([place])   . Sleep apnea    Pt reports "mild sleep apnea"  no cpap  . Vision abnormalities     Past Surgical History:  Procedure Laterality Date  . ANTERIOR CERVICAL DECOMP/DISCECTOMY FUSION N/A 01/20/2017   Procedure: C6 CORPECTOMY WITH BILATERAL FORAMINOTOMIES C5-6 AND C6-7, FUSION C5-7 ANTERIORLY WITH STRUT ALLOGRAFT AND LOCAL BONE GRAFT, ANTERIOR CERVICAL PLATE AND SCREWS;  Surgeon: Jessy Oto, MD;  Location: [facility];  Service: Orthopedics;  Laterality: N/A;  . BACK SURGERY    . HEMATOMA EVACUATION Right 07/13/2014   Procedure: EVACUATION HEMATOMA RIGHT MIDDLE FINGER;  Surgeon: Mcarthur Rossetti, MD;  Location: [facility];  Service: Orthopedics;  Laterality: Right;  . KNEE SURGERY Left    x2  arthroscopy  . NOSE SURGERY    . POSTERIOR CERVICAL FUSION/FORAMINOTOMY N/A 10/09/2017   Procedure: POSTERIOR CERVICAL FUSION C5-6 AND C6-7 WITH POSTERIOR LATERAL MASS SCREWS AND RODS C5-7, POSTERIOR LEFT C6-7 FORAMINOTOMY, POSTERIOR LEFT ILIAC CREST BONE GRAFT HARVEST;  Surgeon: Jessy Oto, MD;  Location: [facility];  Service: Orthopedics;  Laterality: N/A;  . POSTERIOR FUSION LUMBAR SPINE  01/10/2013   Dr Louanne Skye  . WISDOM TOOTH EXTRACTION  Current Outpatient Medications:  .  carisoprodol (SOMA) 350 MG tablet, Take 350 mg by mouth daily., Disp: , Rfl:  .  HYDROcodone-acetaminophen (NORCO/VICODIN) 5-325 MG tablet, Take 1 tablet by mouth every 6 (six) hours as needed for moderate pain., Disp: , Rfl:  .  acetaminophen (TYLENOL) 500 MG tablet, Take 1,000 mg by mouth every 6 (six) hours as needed for moderate pain. , Disp: , Rfl:  .  albuterol (PROVENTIL HFA;VENTOLIN HFA) 108 (90 Base) MCG/ACT inhaler, Inhale 2 puffs into the lungs every 6 (six) hours as needed for wheezing or shortness of breath., Disp: , Rfl:  .  bisoprolol (ZEBETA) 10 MG tablet, Take 1 tablet (10 mg  total) by mouth 2 (two) times daily. (Patient not taking: Reported on 01/04/2019), Disp: 180 tablet, Rfl: 0 .  Cholecalciferol 1000 units capsule, Take 1,000 Units by mouth daily., Disp: , Rfl:  .  clonazePAM (KLONOPIN) 1 MG tablet, Take 1 mg by mouth 2 (two) times daily., Disp: , Rfl:  .  diclofenac (VOLTAREN) 50 MG EC tablet, TAKE ONE TABLET BY MOUTH FOUR TIMES A DAY, Disp: 120 tablet, Rfl: 1 .  metoprolol tartrate (LOPRESSOR) 100 MG tablet, Take 100 mg by mouth 2 (two) times daily as needed (palpitations). , Disp: , Rfl:  .  morphine (MS CONTIN) 30 MG 12 hr tablet, Take 60 mg by mouth every 12 (twelve) hours., Disp: , Rfl:  .  mupirocin ointment (BACTROBAN) 2 %, Apply 1 application topically 2 (two) times daily. (Patient not taking: Reported on 01/04/2019), Disp: 22 g, Rfl: 0 .  naloxone (NARCAN) nasal spray 4 mg/0.1 mL, Spray into nostril for opiod overdose to reverse sedation. (Patient not taking: Reported on 01/04/2019), Disp: 1 kit, Rfl: 1 .  omeprazole (PRILOSEC) 40 MG capsule, Take 40 mg by mouth daily., Disp: , Rfl:  .  oxyCODONE (ROXICODONE) 15 MG immediate release tablet, Take 1 tablet (15 mg total) by mouth every 8 (eight) hours as needed for pain. (Patient not taking: Reported on 01/04/2019), Disp: 30 tablet, Rfl: 0 .  Soft Lens Products (SALINE SENSITIVE EYES) SOLN, 1 drop by Does not apply route daily as needed (dry eyes)., Disp: , Rfl:  .  testosterone cypionate (DEPOTESTOSTERONE CYPIONATE) 200 MG/ML injection, Inject 200 mg into the muscle every 14 (fourteen) days. , Disp: , Rfl: 0 .  tiZANidine (ZANAFLEX) 4 MG tablet, Take 4 mg by mouth every 6 (six) hours as needed for muscle spasms., Disp: , Rfl:    Objective:   Vitals:   01/04/19 0923  BP: 124/78  Pulse: 64  Temp: 98.4 F (36.9 C)  SpO2: 99%    Physical Exam Constitutional:      Appearance: Normal appearance.  HENT:     Head: Normocephalic and atraumatic.  Eyes:     Extraocular Movements: Extraocular movements intact.   Cardiovascular:     Rate and Rhythm: Normal rate.     Pulses: Normal pulses.  Pulmonary:     Effort: Pulmonary effort is normal. No respiratory distress.     Breath sounds: No wheezing.  Abdominal:     General: Abdomen is flat. There is no distension.     Tenderness: There is no abdominal tenderness.  Skin:    General: Skin is warm.  Neurological:     General: No focal deficit present.     Mental Status: He is alert and oriented to person, place, and time.  Psychiatric:        Mood and Affect: Mood normal.  Behavior: Behavior normal.        Thought Content: Thought content normal.        Judgment: Judgment normal.     Assessment & Plan:     ICD-10-CM   1. S/P cervical spinal fusion  Z98.1   2. Spinal stenosis, lumbar region, with neurogenic claudication  M48.062   3. Gait disturbance  R26.9     Pictures were obtained of the patient and placed in the chart with the patient's or guardian's permission. Botox placed in trapezius muscle 4 units each side to see if there is any effect. Plan to have a visit via telephone next week. Recommend neck massage.   Botox LOT: [id number] EXP: 01/23  [place], DO

## 2019-01-11 ENCOUNTER — Ambulatory Visit (INDEPENDENT_AMBULATORY_CARE_PROVIDER_SITE_OTHER): Payer: 59 | Admitting: Plastic Surgery

## 2019-01-11 ENCOUNTER — Encounter: Payer: Self-pay | Admitting: Plastic Surgery

## 2019-01-11 ENCOUNTER — Other Ambulatory Visit: Payer: Self-pay

## 2019-01-11 DIAGNOSIS — Z981 Arthrodesis status: Secondary | ICD-10-CM

## 2019-01-11 NOTE — Progress Notes (Signed)
   Subjective:    Patient ID: DOVE PEOPLES, male    DOB: November 05, 1960, 58 y.o.   MRN: [id number]  The patient is a 58 year old male joining me by the telephone.  He has hardware that is very prominent in his posterior cervical area.  There is concerned that this will become exposed and eroded.  The decision was made with Dr. Louanne Skye to remove the hardware.  With that in mind he will need help with closure.   Review of Systems  Constitutional: Negative.   HENT: Negative.   Eyes: Negative.   Respiratory: Negative.   Cardiovascular: Negative.   Endocrine: Negative.   Genitourinary: Negative.   Musculoskeletal: Negative.   Psychiatric/Behavioral: Negative.        Objective:   Physical Exam     Assessment & Plan:     ICD-10-CM   1. S/P cervical spinal fusion  Z98.1     I spoke with Dr. Louanne Skye and we are in agreement to do the surgery together.  He will remove the hardware and I will work on the closure.  We will try with reapproximation of the muscles.  If that does not work the other option is a local flap or a latissimus flap. The patient gave consent to have this visit done by telemedicine / virtual visit.  This is also consent for access the chart and treat the patient via this visit. The patient is located at home.  I, the provider, am at the office.  We spent 5 minutes together for the visit.

## 2019-01-15 ENCOUNTER — Other Ambulatory Visit: Payer: Self-pay | Admitting: Cardiology

## 2019-01-17 ENCOUNTER — Other Ambulatory Visit (INDEPENDENT_AMBULATORY_CARE_PROVIDER_SITE_OTHER): Payer: Self-pay | Admitting: Specialist

## 2019-01-24 IMAGING — MR MR CERVICAL SPINE W/O CM
4 of 5 series · 26 of 48 positions shown · non-contrast
Comparison: MRI of the lumbar spine 06/28/2016

CLINICAL DATA: Chronic neck pain. Pain extends into the left
shoulder and upper extremity. Numbness into the left hand. Below

EXAM:
MRI CERVICAL SPINE WITHOUT CONTRAST
TECHNIQUE: Multiplanar, multisequence MR imaging of the cervical spine was
performed. No intravenous contrast was administered.

[Series 2: T2 · sagittal · 3.0mm · 0.66mm/px · 6 of 16 slices shown (1 of 3)]
[im 1/16]
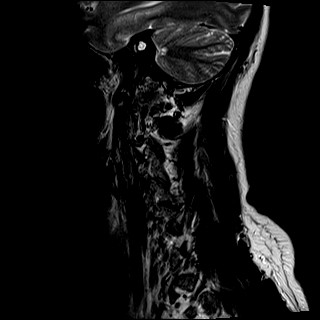
[im 4/16]
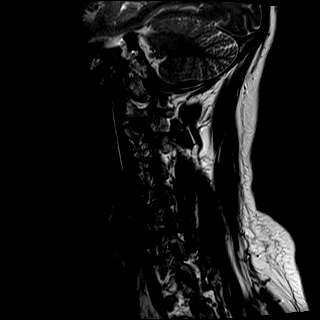
[im 7/16]
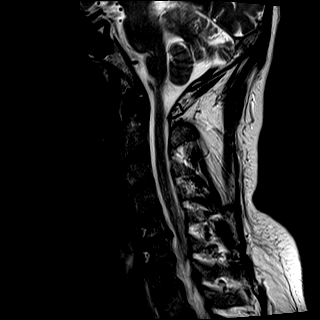
[im 10/16]
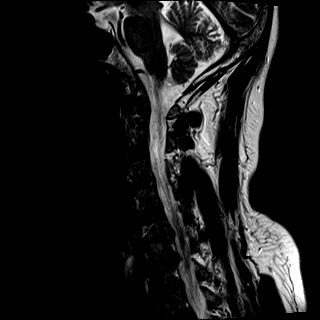
[im 13/16]
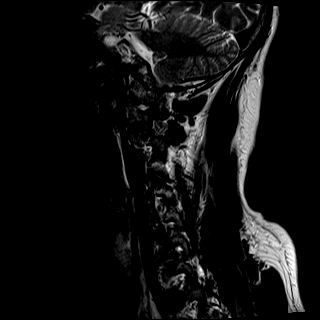
[im 16/16]
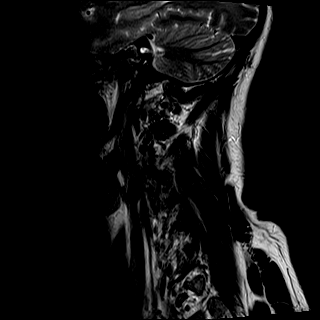

[Series 3: T1 · sagittal · 3.0mm · 0.41mm/px · 4 of 16 slices shown]
[im 1/16]
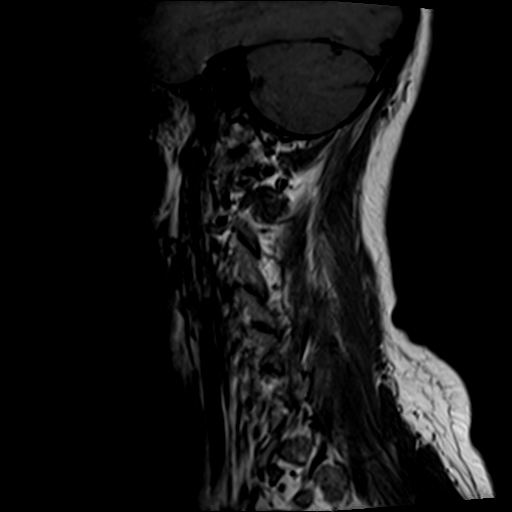
[im 3/16]
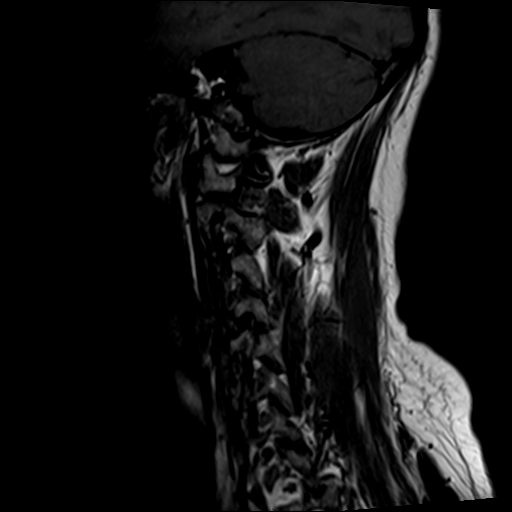
[im 8/16]
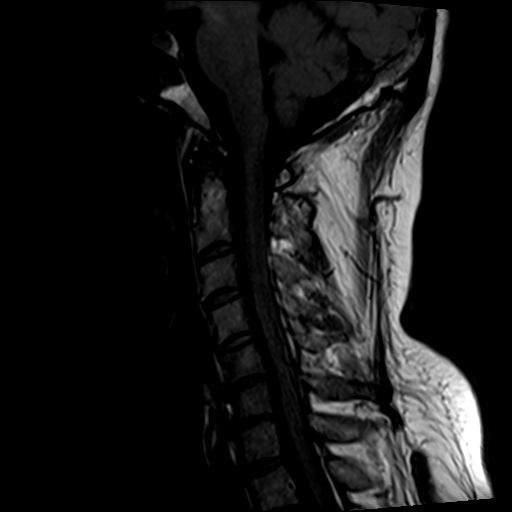
[im 13/16]
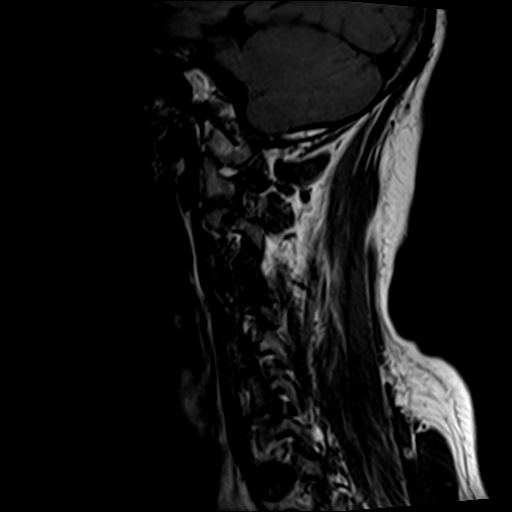

[Series 5: T2 · axial · 3.0mm · 0.39mm/px · z∈[-73,+33]mm · 8 of 32 slices shown (2 of 3)]
[im 1/32]
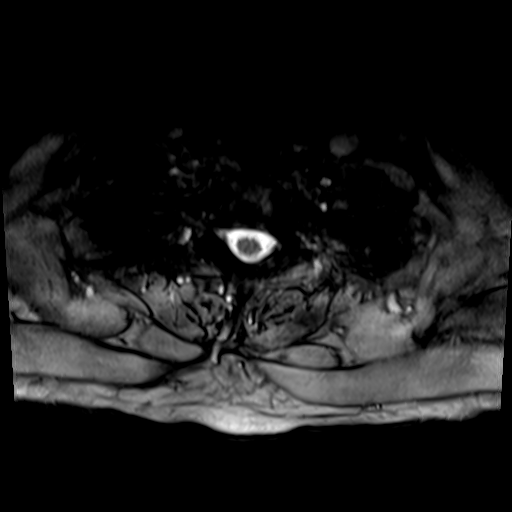
[im 5/32]
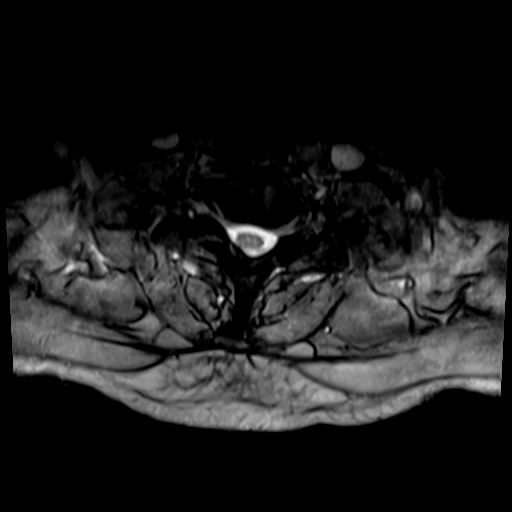
[im 10/32]
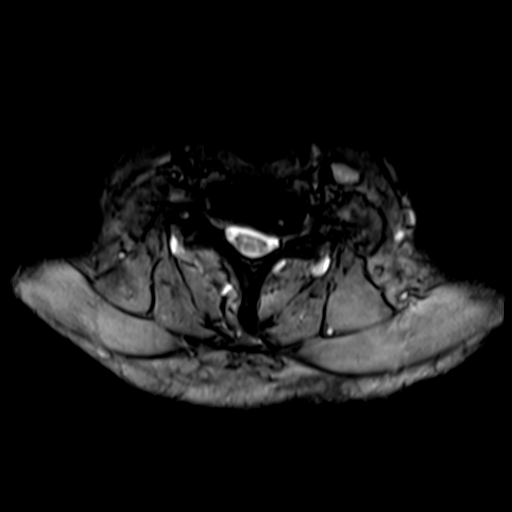
[im 15/32]
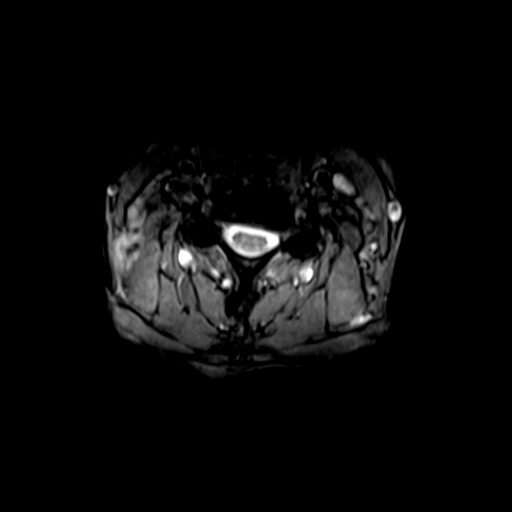
[im 17/32]
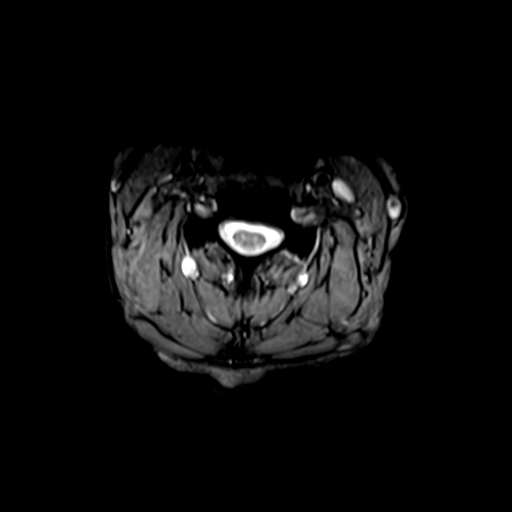
[im 22/32]
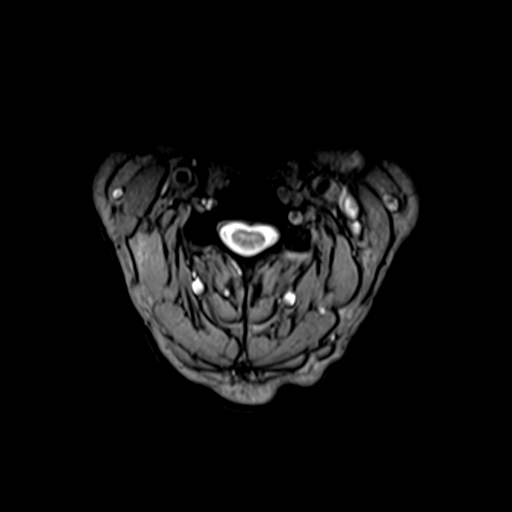
[im 27/32]
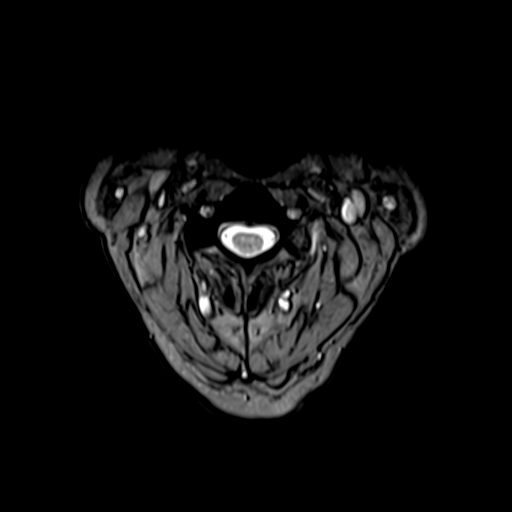
[im 32/32]
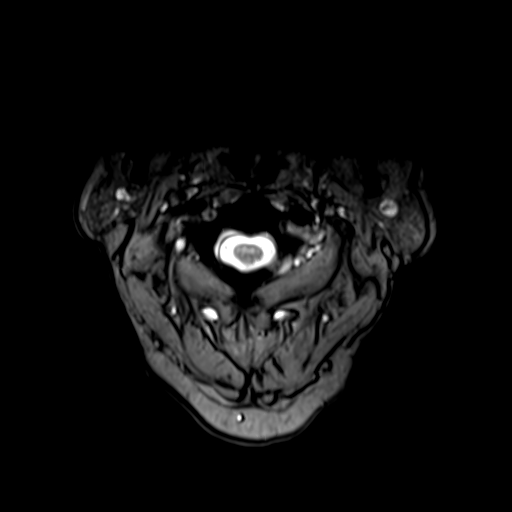

[Series 6: T2 · axial · 3.0mm · 0.62mm/px · z∈[-73,+33]mm · 8 of 32 slices shown (3 of 3)]
[im 1/32]
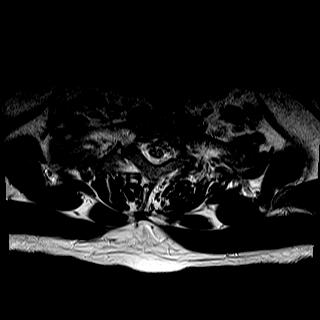
[im 5/32]
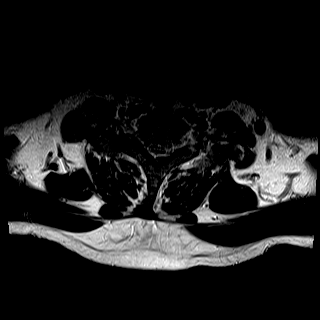
[im 10/32]
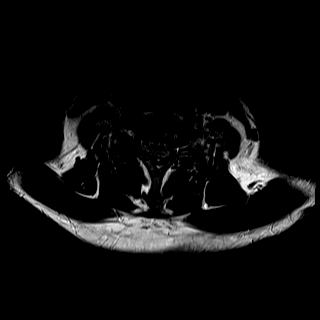
[im 15/32]
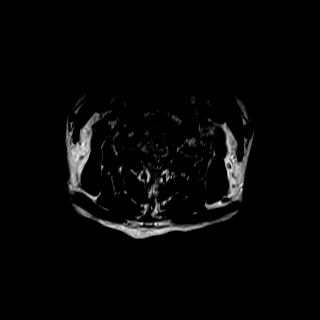
[im 17/32]
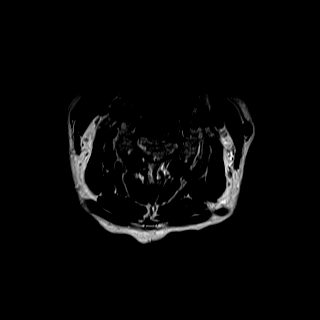
[im 22/32]
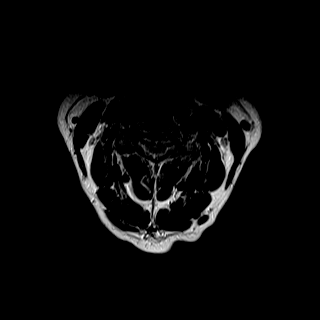
[im 27/32]
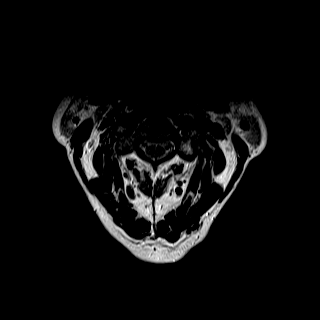
[im 32/32]
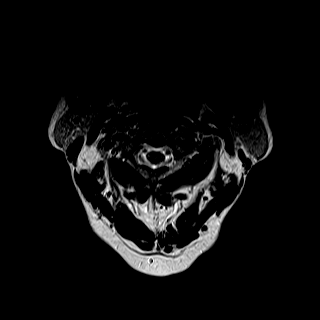

[26 of 48 positions shown; findings below may reference images not displayed]

FINDINGS: Alignment: AP alignment is anatomic. There straightening of the
normal cervical lordosis.

Vertebrae: Chronic endplate marrow changes have progressed at C5-6
and C6-7.

Cord: Normal signal is present in the cervical and upper thoracic
spinal cord to the lowest imaged level, T1.

Posterior Fossa, vertebral arteries, paraspinal tissues: The
craniocervical junction is normal. The visualized intracranial
contents are normal. Flow is present in the vertebral artery is
bilaterally.

Disc levels:

C2-3:  Negative.

C3-4: Asymmetric left-sided uncovertebral and facet hypertrophy
results in a similar appearance of moderate left foraminal stenosis.
There is partial effacement of the ventral CSF.

C4-5: Asymmetric left-sided uncovertebral spurring results in mild
left foraminal narrowing.

C5-6: A broad-based disc osteophyte complex is present.
Uncovertebral and facet hypertrophy is noted bilaterally. There is
partial effacement of the ventral CSF. Moderate foraminal stenosis
is worse on the right without significant interval change.

C6-7: A leftward disc osteophyte complex is present. Asymmetric
uncovertebral spurring leads to severe left and moderate right
foraminal stenosis. There is some progression on the left.

C7-T1: Bilateral facet hypertrophy is again noted. Mild right
foraminal narrowing is evident.
IMPRESSION: 1. Slight progression of severe left foraminal stenosis at C6-7.
2. Moderate right foraminal narrowing at C6-7 is unchanged.
3. Mild central canal narrowing and moderate foraminal narrowing
bilaterally at C5-6 is worse on the right without significant
change.
4. Stable appearance of moderate left foraminal stenosis at C3-4.
5. Progressive endplate marrow changes at see C5-6 and C6-7.

## 2019-01-25 ENCOUNTER — Telehealth: Payer: Self-pay

## 2019-01-25 NOTE — Telephone Encounter (Signed)
Patient called about scheduling surgery.  Need surgery sheet.

## 2019-01-25 NOTE — Telephone Encounter (Signed)
I started his blue sheet

## 2019-01-28 NOTE — Telephone Encounter (Signed)
Form complete

## 2019-02-02 ENCOUNTER — Ambulatory Visit: Payer: 59 | Admitting: Specialist

## 2019-02-07 ENCOUNTER — Other Ambulatory Visit: Payer: Self-pay | Admitting: Cardiology

## 2019-02-07 ENCOUNTER — Other Ambulatory Visit: Payer: Self-pay

## 2019-02-07 MED ORDER — BISOPROLOL FUMARATE 10 MG PO TABS: 10.0000 mg | ORAL_TABLET | Freq: Two times a day (BID) | ORAL | 1 refills | Status: AC

## 2019-02-07 NOTE — Progress Notes (Signed)
Your procedure is scheduled on October 5  Report to [facility] Entrance "A" at 0530 A.M., and check in at the Admitting office.  Call this number if you have problems the morning of surgery:  [phone]  Call [phone] if you have any questions prior to your surgery date Monday-Friday 8am-4pm    Remember:  Do not eat or drink after midnight the night before your surgery    Take these medicines the morning of surgery with A SIP OF WATER  acetaminophen (TYLENOL)  If needed albuterol (PROVENTIL HFA;VENTOLIN HFA) Please bring all inhalers with you the day of surgery.  bisoprolol (ZEBETA)  carisoprodol (SOMA clonazePAM (KLONOPIN) metoprolol succinate (TOPROL-XL) morphine (MS CONTIN)  omeprazole (PRILOSEC) Eye drops if needed   7 days prior to surgery STOP taking any diclofenac (VOLTAREN), Aspirin (unless otherwise instructed by your surgeon), Aleve, Naproxen, Ibuprofen, Motrin, Advil, Goody's, BC's, all herbal medications, fish oil, and all vitamins.    The Morning of Surgery  Do not wear jewelry  Do not wear lotions, powders, or colognes, or deodorant  Men may shave face and neck.  Do not bring valuables to the hospital.  [facility] is not responsible for any belongings or valuables.  If you are a smoker, DO NOT Smoke 24 hours prior to surgery IF you wear a CPAP at night please bring your mask, tubing, and machine the morning of surgery   Remember that you must have someone to transport you home after your surgery, and remain with you for 24 hours if you are discharged the same day.   Contacts, glasses, hearing aids, dentures or bridgework may not be worn into surgery.    Leave your suitcase in the car.  After surgery it may be brought to your room.  For patients admitted to the hospital, discharge time will be determined by your treatment team.  Patients discharged the day of surgery will not be allowed to drive home.    Special instructions:   [facility]- Preparing For Surgery  Before surgery, you can play an important role. Because skin is not sterile, your skin needs to be as free of germs as possible. You can reduce the number of germs on your skin by washing with CHG (chlorahexidine gluconate) Soap before surgery.  CHG is an antiseptic cleaner which kills germs and bonds with the skin to continue killing germs even after washing.    Oral Hygiene is also important to reduce your risk of infection.  Remember - BRUSH YOUR TEETH THE MORNING OF SURGERY WITH YOUR REGULAR TOOTHPASTE  Please do not use if you have an allergy to CHG or antibacterial soaps. If your skin becomes reddened/irritated stop using the CHG.  Do not shave (including legs and underarms) for at least 48 hours prior to first CHG shower. It is OK to shave your face.  Please follow these instructions carefully.   1. Shower the NIGHT BEFORE SURGERY and the MORNING OF SURGERY with CHG Soap.   2. If you chose to wash your hair, wash your hair first as usual with your normal shampoo.  3. After you shampoo, rinse your hair and body thoroughly to remove the shampoo.  4. Use CHG as you would any other liquid soap. You can apply CHG directly to the skin and wash gently with a scrungie or a clean washcloth.   5. Apply the CHG Soap to your body ONLY FROM THE NECK DOWN.  Do not use on open wounds or  open sores. Avoid contact with your eyes, ears, mouth and genitals (private parts). Wash Face and genitals (private parts)  with your normal soap.   6. Wash thoroughly, paying special attention to the area where your surgery will be performed.  7. Thoroughly rinse your body with warm water from the neck down.  8. DO NOT shower/wash with your normal soap after using and rinsing off the CHG Soap.  9. Pat yourself dry with a CLEAN TOWEL.  10. Wear CLEAN PAJAMAS to bed the night before surgery, wear comfortable clothes the morning of surgery  11. Place CLEAN SHEETS on your bed the  night of your first shower and DO NOT SLEEP WITH PETS.    Day of Surgery:  Do not apply any deodorants/lotions. Please shower the morning of surgery with the CHG soap  Please wear clean clothes to the hospital/surgery center.   Remember to brush your teeth WITH YOUR REGULAR TOOTHPASTE.   Please read over the following fact sheets that you were given.

## 2019-02-08 ENCOUNTER — Encounter (HOSPITAL_COMMUNITY): Payer: Self-pay

## 2019-02-08 ENCOUNTER — Other Ambulatory Visit: Payer: Self-pay

## 2019-02-08 ENCOUNTER — Encounter (HOSPITAL_COMMUNITY)
Admission: RE | Admit: 2019-02-08 | Discharge: 2019-02-08 | Disposition: A | Discharge status: Home / Self Care | Payer: 59 | Source: Ambulatory Visit | Attending: Specialist | Admitting: Specialist

## 2019-02-08 DIAGNOSIS — I10 Essential (primary) hypertension: Secondary | ICD-10-CM | POA: Insufficient documentation

## 2019-02-08 DIAGNOSIS — Z01818 Encounter for other preprocedural examination: Secondary | ICD-10-CM | POA: Diagnosis present

## 2019-02-08 HISTORY — DX: Cerebral infarction, unspecified: I63.9

## 2019-02-08 HISTORY — DX: Unspecified injury of head, initial encounter: S09.90XA

## 2019-02-08 LAB — BASIC METABOLIC PANEL
Anion gap: 9 (ref 5–15)
BUN: 18 mg/dL (ref 6–20)
CO2: 24 mmol/L (ref 22–32)
Calcium: 9.4 mg/dL (ref 8.9–10.3)
Chloride: 105 mmol/L (ref 98–111)
Creatinine, Ser: 0.84 mg/dL (ref 0.61–1.24)
GFR calc Af Amer: >60 mL/min (ref >60)
GFR calc non Af Amer: >60 mL/min (ref >60)
Glucose, Bld: 116 mg/dL — ABNORMAL HIGH (ref 70–99)
Potassium: 5 mmol/L (ref 3.5–5.1)
Sodium: 138 mmol/L (ref 135–145)

## 2019-02-08 LAB — CBC
HCT: 42.8 % (ref 39.0–52.0)
Hemoglobin: 14.1 g/dL (ref 13.0–17.0)
MCH: 30.4 pg (ref 26.0–34.0)
MCHC: 32.9 g/dL (ref 30.0–36.0)
MCV: 92.2 fL (ref 80.0–100.0)
Platelets: 280 10*3/uL (ref 150–400)
RBC: 4.64 MIL/uL (ref 4.22–5.81)
RDW: 12.7 % (ref 11.5–15.5)
WBC: 6.5 10*3/uL (ref 4.0–10.5)
nRBC: 0 % (ref 0.0–0.2)

## 2019-02-08 LAB — SURGICAL PCR SCREEN
MRSA, PCR: NEGATIVE
Staphylococcus aureus: NEGATIVE

## 2019-02-08 NOTE — Progress Notes (Addendum)
[facility]  Cardiologist - no  Chest x-ray - na  EKG - requesting from PCP  Stress Test - no  ECHO - no  Cardiac Cath - no   Sleep Study - "years ago- "I didn't ever go to sleep" CPAP -   LABS-cbc, bmp, pcr  ASA-no  ERAS-no orders  HA1C-na Fasting Blood Sugar - na Checks Blood Sugar ___0__ times a day  Anesthesia-  Pt denies having chest pain, sob, or fever at this time. All instructions explained to the pt, with a verbal understanding of the material. Pt agrees to go over the instructions while at home for a better understanding. Pt also instructed to self quarantine after being tested for COVID-19. The opportunity to ask questions was provided.  Joel Galloway does not want an indwelling catheter- "People have tried, even a Dealer and they could not get it in and they messed me up."  Patient has this listed in his 5 Wishes- which is on his chart.

## 2019-02-08 NOTE — Progress Notes (Addendum)
Your procedure is scheduled on October 5  Report to [facility] Entrance "A" at 0530 A.M., and check in at the Admitting office.            Your surgery or procedure is scheduled for 7:30 AM  Call this number if you have problems the morning of surgery:  [phone]  Call [phone] if you have any questions prior to your surgery date Monday-Friday 8am-4pm    Remember:  Do not eat or drink after midnight the night before your surgery    Take these medicines the morning of surgery with A SIP OF WATER:   bisoprolol (ZEBETA)  carisoprodol (SOMA metoprolol succinate (TOPROL-XL) morphine (MS CONTIN)  omeprazole (PRILOSEC)   Use if needed: acetaminophen (TYLENOL)  If needed albuterol (PROVENTIL HFA;VENTOLIN HFA) Please bring all inhalers with you the day of surgery.  Eye drops if needed Norco  7 days prior to surgery STOP taking any diclofenac (VOLTAREN), Aspirin (unless otherwise instructed by your surgeon), Aleve, Naproxen, Ibuprofen, Motrin, Advil, Goody's, BC's, all herbal medications, fish oil, and all vitamins.    Special instructions:   [facility]- Preparing For Surgery  Before surgery, you can play an important role. Because skin is not sterile, your skin needs to be as free of germs as possible. You can reduce the number of germs on your skin by washing with CHG (chlorahexidine gluconate) Soap before surgery.  CHG is an antiseptic cleaner which kills germs and bonds with the skin to continue killing germs even after washing.    Oral Hygiene is also important to reduce your risk of infection.  Remember - BRUSH YOUR TEETH THE MORNING OF SURGERY WITH YOUR REGULAR TOOTHPASTE  Please do not use if you have an allergy to CHG or antibacterial soaps. If your skin becomes reddened/irritated stop using the CHG.   Please follow these instructions carefully.   1. Shower the NIGHT BEFORE SURGERY and the MORNING OF SURGERY with CHG Soap.   2. If you chose to wash your  hair, wash your hair first as usual with your normal shampoo.  3. After you shampoo,wash your face and private area with the soap you use at home, then rinse your hair and body thoroughly to remove the shampoo and soap.   4. Use CHG as you would any other liquid soap. You can apply CHG directly to the skin and wash gently with a scrungie or a clean washcloth.   Apply the CHG Soap to your body ONLY FROM THE NECK DOWN.  Do not use on open wounds or open sores. Avoid contact with your eyes, ears, mouth and genitals (private parts).   5. Wash thoroughly, paying special attention to the area where your surgery will be performed.  6. Thoroughly rinse your body with warm water from the neck down.  7. DO NOT shower/wash with your normal soap after using and rinsing off the CHG Soap.  8. Pat yourself dry with a CLEAN TOWEL.  9. Wear CLEAN PAJAMAS to bed the night before surgery, wear comfortable clothes the morning of surgery  10. Place CLEAN SHEETS on your bed the night of your first shower and DO NOT SLEEP WITH PETS.  Day of Surgery: Shower as instructed above. Do not apply any deodorants/lotions. Please shower the morning of surgery with the CHG soap  Please wear clean clothes to the hospital/surgery center.   Remember to brush your teeth WITH YOUR REGULAR TOOTHPASTE.  Please read over the following fact sheets  that you were given: Coughing and Deep Breathing, Pain Booklet, Patient Instructions for Mupirocin Application and Surgical Site Infections
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
## 2019-02-10 ENCOUNTER — Encounter: Payer: Self-pay | Admitting: Surgery

## 2019-02-10 ENCOUNTER — Ambulatory Visit (INDEPENDENT_AMBULATORY_CARE_PROVIDER_SITE_OTHER): Payer: 59 | Admitting: Surgery

## 2019-02-10 VITALS — BP 124/75 | HR 62 | Ht 72.0 in | Wt 195.0 lb

## 2019-02-10 DIAGNOSIS — T8484XA Pain due to internal orthopedic prosthetic devices, implants and grafts, initial encounter: Secondary | ICD-10-CM

## 2019-02-10 DIAGNOSIS — M542 Cervicalgia: Secondary | ICD-10-CM

## 2019-02-10 NOTE — Progress Notes (Signed)
58 year old white male with a history of painful retained posterior cervical hardware comes in for preop evaluation.  Symptoms unchanged from previous visit.  He is wanting to proceed with REMOVAL OF POSTERIOR SEGMENTAL FIXATION CERVICAL C5-C7, ASSESSMENT OF SOFT TISSUE by Dr. Louanne Skye and he will also have [place] FLAP TO NECK by Dr. Marla Roe.  He is expected to see Dr. deal in him for preop next Thursday.  All questions answered.

## 2019-02-16 ENCOUNTER — Other Ambulatory Visit: Payer: Self-pay

## 2019-02-16 ENCOUNTER — Telehealth: Payer: Self-pay

## 2019-02-16 NOTE — Telephone Encounter (Signed)
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
## 2019-02-17 ENCOUNTER — Ambulatory Visit (INDEPENDENT_AMBULATORY_CARE_PROVIDER_SITE_OTHER): Payer: 59 | Admitting: Surgical

## 2019-02-17 ENCOUNTER — Encounter: Payer: Self-pay | Admitting: Surgical

## 2019-02-17 ENCOUNTER — Other Ambulatory Visit (HOSPITAL_COMMUNITY)
Admission: RE | Admit: 2019-02-17 | Discharge: 2019-02-17 | Disposition: A | Discharge status: Home / Self Care | Payer: 59 | Source: Ambulatory Visit | Attending: Specialist | Admitting: Specialist

## 2019-02-17 ENCOUNTER — Other Ambulatory Visit: Payer: Self-pay

## 2019-02-17 VITALS — BP 100/64 | HR 78 | Temp 96.9°F | Ht 72.0 in | Wt 174.0 lb

## 2019-02-17 DIAGNOSIS — Z981 Arthrodesis status: Secondary | ICD-10-CM

## 2019-02-17 DIAGNOSIS — Z20828 Contact with and (suspected) exposure to other viral communicable diseases: Secondary | ICD-10-CM | POA: Diagnosis present

## 2019-02-17 DIAGNOSIS — R269 Unspecified abnormalities of gait and mobility: Secondary | ICD-10-CM

## 2019-02-17 MED ORDER — CEPHALEXIN 500 MG PO CAPS: 500.0000 mg | ORAL_CAPSULE | Freq: Two times a day (BID) | ORAL | 0 refills | Status: AC

## 2019-02-17 MED ORDER — OXYCODONE HCL 15 MG PO TABS: 15.0000 mg | ORAL_TABLET | Freq: Three times a day (TID) | ORAL | 0 refills | Status: DC | PRN

## 2019-02-17 MED ORDER — ONDANSETRON HCL 4 MG PO TABS: 4.0000 mg | ORAL_TABLET | Freq: Three times a day (TID) | ORAL | 0 refills | Status: DC | PRN

## 2019-02-18 LAB — NOVEL CORONAVIRUS, NAA (HOSP ORDER, SEND-OUT TO REF LAB; TAT 18-24 HRS): SARS-CoV-2, NAA: NOT DETECTED

## 2019-02-21 ENCOUNTER — Encounter (HOSPITAL_COMMUNITY)
Admission: RE | Disposition: A | Discharge status: Home / Self Care | Payer: Self-pay | Source: Home / Self Care | Attending: Specialist

## 2019-02-21 ENCOUNTER — Inpatient Hospital Stay (HOSPITAL_COMMUNITY): Payer: 59

## 2019-02-21 ENCOUNTER — Inpatient Hospital Stay (HOSPITAL_COMMUNITY): Payer: 59 | Admitting: Physician Assistant

## 2019-02-21 ENCOUNTER — Observation Stay (HOSPITAL_COMMUNITY)
Admission: RE | Admit: 2019-02-21 | Discharge: 2019-02-21 | Disposition: A | Discharge status: Home / Self Care | Payer: 59 | Attending: Specialist | Admitting: Specialist

## 2019-02-21 ENCOUNTER — Inpatient Hospital Stay (HOSPITAL_COMMUNITY): Payer: 59 | Admitting: Anesthesiology

## 2019-02-21 ENCOUNTER — Encounter (HOSPITAL_COMMUNITY): Payer: Self-pay

## 2019-02-21 ENCOUNTER — Other Ambulatory Visit: Payer: Self-pay

## 2019-02-21 DIAGNOSIS — T8131XA Disruption of external operation (surgical) wound, not elsewhere classified, initial encounter: Secondary | ICD-10-CM | POA: Insufficient documentation

## 2019-02-21 DIAGNOSIS — Z981 Arthrodesis status: Secondary | ICD-10-CM | POA: Insufficient documentation

## 2019-02-21 DIAGNOSIS — Z79899 Other long term (current) drug therapy: Secondary | ICD-10-CM | POA: Insufficient documentation

## 2019-02-21 DIAGNOSIS — N189 Chronic kidney disease, unspecified: Secondary | ICD-10-CM | POA: Diagnosis not present

## 2019-02-21 DIAGNOSIS — Z01818 Encounter for other preprocedural examination: Secondary | ICD-10-CM

## 2019-02-21 DIAGNOSIS — I129 Hypertensive chronic kidney disease with stage 1 through stage 4 chronic kidney disease, or unspecified chronic kidney disease: Secondary | ICD-10-CM | POA: Insufficient documentation

## 2019-02-21 DIAGNOSIS — Z85828 Personal history of other malignant neoplasm of skin: Secondary | ICD-10-CM | POA: Diagnosis not present

## 2019-02-21 DIAGNOSIS — Z8673 Personal history of transient ischemic attack (TIA), and cerebral infarction without residual deficits: Secondary | ICD-10-CM | POA: Diagnosis not present

## 2019-02-21 DIAGNOSIS — T8484XA Pain due to internal orthopedic prosthetic devices, implants and grafts, initial encounter: Secondary | ICD-10-CM

## 2019-02-21 DIAGNOSIS — M4322 Fusion of spine, cervical region: Secondary | ICD-10-CM | POA: Diagnosis present

## 2019-02-21 DIAGNOSIS — Z791 Long term (current) use of non-steroidal anti-inflammatories (NSAID): Secondary | ICD-10-CM | POA: Diagnosis not present

## 2019-02-21 DIAGNOSIS — G4730 Sleep apnea, unspecified: Secondary | ICD-10-CM | POA: Diagnosis not present

## 2019-02-21 DIAGNOSIS — J45909 Unspecified asthma, uncomplicated: Secondary | ICD-10-CM | POA: Diagnosis not present

## 2019-02-21 DIAGNOSIS — T81328A Disruption or dehiscence of closure of other specified internal operation (surgical) wound, initial encounter: Secondary | ICD-10-CM | POA: Diagnosis present

## 2019-02-21 DIAGNOSIS — Y831 Surgical operation with implant of artificial internal device as the cause of abnormal reaction of the patient, or of later complication, without mention of misadventure at the time of the procedure: Secondary | ICD-10-CM | POA: Diagnosis not present

## 2019-02-21 DIAGNOSIS — Y838 Other surgical procedures as the cause of abnormal reaction of the patient, or of later complication, without mention of misadventure at the time of the procedure: Secondary | ICD-10-CM | POA: Diagnosis not present

## 2019-02-21 DIAGNOSIS — I2510 Atherosclerotic heart disease of native coronary artery without angina pectoris: Secondary | ICD-10-CM | POA: Insufficient documentation

## 2019-02-21 DIAGNOSIS — T8132XA Disruption of internal operation (surgical) wound, not elsewhere classified, initial encounter: Secondary | ICD-10-CM | POA: Diagnosis not present

## 2019-02-21 HISTORY — PX: HARDWARE REMOVAL: SHX979

## 2019-02-21 HISTORY — PX: MUSCLE FLAP CLOSURE: SHX2054

## 2019-02-21 LAB — COMPREHENSIVE METABOLIC PANEL
ALT: 23 U/L (ref 0–44)
AST: 19 U/L (ref 15–41)
Albumin: 4.3 g/dL (ref 3.5–5.0)
Alkaline Phosphatase: 38 U/L (ref 38–126)
Anion gap: 10 (ref 5–15)
BUN: 18 mg/dL (ref 6–20)
CO2: 26 mmol/L (ref 22–32)
Calcium: 9.3 mg/dL (ref 8.9–10.3)
Chloride: 103 mmol/L (ref 98–111)
Creatinine, Ser: 0.89 mg/dL (ref 0.61–1.24)
GFR calc Af Amer: >60 mL/min (ref >60)
GFR calc non Af Amer: >60 mL/min (ref >60)
Glucose, Bld: 112 mg/dL — ABNORMAL HIGH (ref 70–99)
Potassium: 4.1 mmol/L (ref 3.5–5.1)
Sodium: 139 mmol/L (ref 135–145)
Total Bilirubin: 0.2 mg/dL — ABNORMAL LOW (ref 0.3–1.2)
Total Protein: 6.6 g/dL (ref 6.5–8.1)

## 2019-02-21 LAB — PROTIME-INR
INR: 0.9 (ref 0.8–1.2)
Prothrombin Time: 12.5 seconds (ref 11.4–15.2)

## 2019-02-21 LAB — APTT: aPTT: 26 seconds (ref 24–36)

## 2019-02-21 SURGERY — REMOVAL, HARDWARE
Anesthesia: General | Site: Neck

## 2019-02-21 MED ORDER — DEXMEDETOMIDINE HCL IN NACL 200 MCG/50ML IV SOLN
INTRAVENOUS | Status: DC | PRN
  Administered 2019-02-21: 12 ug via INTRAVENOUS
  Administered 2019-02-21 (×2): 8 ug via INTRAVENOUS

## 2019-02-21 MED ORDER — KETAMINE HCL 50 MG/5ML IJ SOSY
PREFILLED_SYRINGE | INTRAMUSCULAR | Status: AC
  Filled 2019-02-21: qty 10

## 2019-02-21 MED ORDER — PHENYLEPHRINE 40 MCG/ML (10ML) SYRINGE FOR IV PUSH (FOR BLOOD PRESSURE SUPPORT)
PREFILLED_SYRINGE | INTRAVENOUS | Status: AC
  Filled 2019-02-21: qty 10

## 2019-02-21 MED ORDER — SODIUM CHLORIDE 0.9% FLUSH: 3.0000 mL | Freq: Two times a day (BID) | INTRAVENOUS | Status: DC

## 2019-02-21 MED ORDER — KETAMINE HCL 10 MG/ML IJ SOLN
INTRAMUSCULAR | Status: DC | PRN
  Administered 2019-02-21: 40 mg via INTRAVENOUS
  Administered 2019-02-21: 10 mg via INTRAVENOUS

## 2019-02-21 MED ORDER — FENTANYL CITRATE (PF) 100 MCG/2ML IJ SOLN
INTRAMUSCULAR | Status: DC | PRN
  Administered 2019-02-21: 50 ug via INTRAVENOUS

## 2019-02-21 MED ORDER — LACTATED RINGERS IV SOLN
INTRAVENOUS | Status: DC | PRN
  Administered 2019-02-21: 08:00:00 via INTRAVENOUS

## 2019-02-21 MED ORDER — PANTOPRAZOLE SODIUM 40 MG PO TBEC: 40.0000 mg | DELAYED_RELEASE_TABLET | Freq: Every day | ORAL | Status: DC

## 2019-02-21 MED ORDER — PHENOL 1.4 % MT LIQD: 1.0000 | OROMUCOSAL | Status: DC | PRN

## 2019-02-21 MED ORDER — ACETAMINOPHEN 325 MG PO TABS: 650.0000 mg | ORAL_TABLET | ORAL | Status: DC | PRN

## 2019-02-21 MED ORDER — ONDANSETRON HCL 4 MG/2ML IJ SOLN: 4.0000 mg | Freq: Four times a day (QID) | INTRAMUSCULAR | Status: DC | PRN

## 2019-02-21 MED ORDER — DEXAMETHASONE SODIUM PHOSPHATE 10 MG/ML IJ SOLN
INTRAMUSCULAR | Status: DC | PRN
  Administered 2019-02-21: 10 mg via INTRAVENOUS

## 2019-02-21 MED ORDER — CARISOPRODOL 350 MG PO TABS: 350.0000 mg | ORAL_TABLET | Freq: Every day | ORAL | Status: DC

## 2019-02-21 MED ORDER — DEXAMETHASONE SODIUM PHOSPHATE 10 MG/ML IJ SOLN
INTRAMUSCULAR | Status: AC
  Filled 2019-02-21: qty 1

## 2019-02-21 MED ORDER — FLEET ENEMA 7-19 GM/118ML RE ENEM: 1.0000 | ENEMA | Freq: Once | RECTAL | Status: DC | PRN

## 2019-02-21 MED ORDER — ACETAMINOPHEN 10 MG/ML IV SOLN
INTRAVENOUS | Status: AC
  Filled 2019-02-21: qty 100

## 2019-02-21 MED ORDER — BISACODYL 5 MG PO TBEC: 5.0000 mg | DELAYED_RELEASE_TABLET | Freq: Every day | ORAL | Status: DC | PRN

## 2019-02-21 MED ORDER — CEFAZOLIN SODIUM-DEXTROSE 2-4 GM/100ML-% IV SOLN: 2.0000 g | Freq: Three times a day (TID) | INTRAVENOUS | Status: DC

## 2019-02-21 MED ORDER — SODIUM CHLORIDE 0.9 % IV SOLN
INTRAVENOUS | Status: AC
  Filled 2019-02-21: qty 500000

## 2019-02-21 MED ORDER — ZOLPIDEM TARTRATE 5 MG PO TABS: 5.0000 mg | ORAL_TABLET | Freq: Every evening | ORAL | Status: DC | PRN

## 2019-02-21 MED ORDER — CEFAZOLIN SODIUM-DEXTROSE 2-4 GM/100ML-% IV SOLN
2.0000 g | INTRAVENOUS | Status: AC
  Administered 2019-02-21: 2 g via INTRAVENOUS

## 2019-02-21 MED ORDER — ROCURONIUM BROMIDE 100 MG/10ML IV SOLN
INTRAVENOUS | Status: DC | PRN
  Administered 2019-02-21 (×2): 50 mg via INTRAVENOUS

## 2019-02-21 MED ORDER — LIDOCAINE 2% (20 MG/ML) 5 ML SYRINGE
INTRAMUSCULAR | Status: AC
  Filled 2019-02-21: qty 5

## 2019-02-21 MED ORDER — SODIUM CHLORIDE 0.9 % IV SOLN: INTRAVENOUS | Status: DC

## 2019-02-21 MED ORDER — CEFAZOLIN SODIUM-DEXTROSE 2-4 GM/100ML-% IV SOLN: 2.0000 g | INTRAVENOUS | Status: DC

## 2019-02-21 MED ORDER — ACETAMINOPHEN 500 MG PO TABS: 1000.0000 mg | ORAL_TABLET | Freq: Four times a day (QID) | ORAL | Status: DC | PRN

## 2019-02-21 MED ORDER — SODIUM CHLORIDE 0.9 % IV SOLN
INTRAVENOUS | Status: DC | PRN
  Administered 2019-02-21: 09:00:00 50 ug/min via INTRAVENOUS

## 2019-02-21 MED ORDER — PHENYLEPHRINE 40 MCG/ML (10ML) SYRINGE FOR IV PUSH (FOR BLOOD PRESSURE SUPPORT)
PREFILLED_SYRINGE | INTRAVENOUS | Status: DC | PRN
  Administered 2019-02-21: 80 ug via INTRAVENOUS
  Administered 2019-02-21: 40 ug via INTRAVENOUS
  Administered 2019-02-21: 80 ug via INTRAVENOUS
  Administered 2019-02-21: 40 ug via INTRAVENOUS
  Administered 2019-02-21: 80 ug via INTRAVENOUS
  Administered 2019-02-21: 120 ug via INTRAVENOUS

## 2019-02-21 MED ORDER — ALBUTEROL SULFATE HFA 108 (90 BASE) MCG/ACT IN AERS: 2.0000 | INHALATION_SPRAY | Freq: Four times a day (QID) | RESPIRATORY_TRACT | Status: DC | PRN

## 2019-02-21 MED ORDER — ONDANSETRON HCL 4 MG/2ML IJ SOLN
INTRAMUSCULAR | Status: AC
  Filled 2019-02-21: qty 2

## 2019-02-21 MED ORDER — LIDOCAINE 2% (20 MG/ML) 5 ML SYRINGE
INTRAMUSCULAR | Status: DC | PRN
  Administered 2019-02-21: 80 mg via INTRAVENOUS

## 2019-02-21 MED ORDER — DOCUSATE SODIUM 100 MG PO CAPS: 100.0000 mg | ORAL_CAPSULE | Freq: Two times a day (BID) | ORAL | Status: DC

## 2019-02-21 MED ORDER — ONDANSETRON HCL 4 MG PO TABS: 4.0000 mg | ORAL_TABLET | Freq: Four times a day (QID) | ORAL | Status: DC | PRN

## 2019-02-21 MED ORDER — ROSUVASTATIN CALCIUM 5 MG PO TABS: 10.0000 mg | ORAL_TABLET | Freq: Every day | ORAL | Status: DC

## 2019-02-21 MED ORDER — THROMBIN (RECOMBINANT) 20000 UNITS EX SOLR
CUTANEOUS | Status: AC
  Filled 2019-02-21: qty 20000

## 2019-02-21 MED ORDER — ROCURONIUM BROMIDE 10 MG/ML (PF) SYRINGE
PREFILLED_SYRINGE | INTRAVENOUS | Status: AC
  Filled 2019-02-21: qty 10

## 2019-02-21 MED ORDER — PROPOFOL 10 MG/ML IV BOLUS
INTRAVENOUS | Status: AC
  Filled 2019-02-21: qty 20

## 2019-02-21 MED ORDER — ONDANSETRON HCL 4 MG PO TABS: 4.0000 mg | ORAL_TABLET | Freq: Three times a day (TID) | ORAL | Status: DC | PRN

## 2019-02-21 MED ORDER — BUPIVACAINE-EPINEPHRINE (PF) 0.25% -1:200000 IJ SOLN
INTRAMUSCULAR | Status: DC | PRN
  Administered 2019-02-21: 10 mL

## 2019-02-21 MED ORDER — CEFAZOLIN SODIUM-DEXTROSE 2-4 GM/100ML-% IV SOLN
INTRAVENOUS | Status: AC
  Filled 2019-02-21: qty 100

## 2019-02-21 MED ORDER — ALUM & MAG HYDROXIDE-SIMETH 200-200-20 MG/5ML PO SUSP: 30.0000 mL | Freq: Four times a day (QID) | ORAL | Status: DC | PRN

## 2019-02-21 MED ORDER — BUPIVACAINE LIPOSOME 1.3 % IJ SUSP
20.0000 mL | INTRAMUSCULAR | Status: DC
  Filled 2019-02-21: qty 20

## 2019-02-21 MED ORDER — LACTATED RINGERS IV SOLN
INTRAVENOUS | Status: DC | PRN
  Administered 2019-02-21: 07:00:00 via INTRAVENOUS

## 2019-02-21 MED ORDER — MENTHOL 3 MG MT LOZG: 1.0000 | LOZENGE | OROMUCOSAL | Status: DC | PRN

## 2019-02-21 MED ORDER — PROPOFOL 10 MG/ML IV BOLUS
INTRAVENOUS | Status: DC | PRN
  Administered 2019-02-21: 200 mg via INTRAVENOUS

## 2019-02-21 MED ORDER — POLYETHYLENE GLYCOL 3350 17 G PO PACK: 17.0000 g | PACK | Freq: Every day | ORAL | Status: DC | PRN

## 2019-02-21 MED ORDER — BUPIVACAINE HCL (PF) 0.5 % IJ SOLN
INTRAMUSCULAR | Status: AC
  Filled 2019-02-21: qty 30

## 2019-02-21 MED ORDER — ACETAMINOPHEN 10 MG/ML IV SOLN
INTRAVENOUS | Status: DC | PRN
  Administered 2019-02-21: 1000 mg via INTRAVENOUS

## 2019-02-21 MED ORDER — SODIUM CHLORIDE 0.9% FLUSH: 3.0000 mL | INTRAVENOUS | Status: DC | PRN

## 2019-02-21 MED ORDER — MIDAZOLAM HCL 2 MG/2ML IJ SOLN
INTRAMUSCULAR | Status: AC
  Filled 2019-02-21: qty 2

## 2019-02-21 MED ORDER — BISOPROLOL FUMARATE 10 MG PO TABS
10.0000 mg | ORAL_TABLET | Freq: Two times a day (BID) | ORAL | Status: DC
  Filled 2019-02-21: qty 1

## 2019-02-21 MED ORDER — ONDANSETRON HCL 4 MG/2ML IJ SOLN
INTRAMUSCULAR | Status: DC | PRN
  Administered 2019-02-21: 4 mg via INTRAVENOUS

## 2019-02-21 MED ORDER — EPHEDRINE 5 MG/ML INJ
INTRAVENOUS | Status: AC
  Filled 2019-02-21: qty 10

## 2019-02-21 MED ORDER — SUGAMMADEX SODIUM 200 MG/2ML IV SOLN
INTRAVENOUS | Status: DC | PRN
  Administered 2019-02-21: 200 mg via INTRAVENOUS

## 2019-02-21 MED ORDER — ACETAMINOPHEN 650 MG RE SUPP: 650.0000 mg | RECTAL | Status: DC | PRN

## 2019-02-21 MED ORDER — ALBUTEROL SULFATE (2.5 MG/3ML) 0.083% IN NEBU: 2.5000 mg | INHALATION_SOLUTION | Freq: Four times a day (QID) | RESPIRATORY_TRACT | Status: DC | PRN

## 2019-02-21 MED ORDER — OXYCODONE HCL 15 MG PO TABS: 15.0000 mg | ORAL_TABLET | ORAL | 0 refills | Status: AC | PRN

## 2019-02-21 MED ORDER — MORPHINE SULFATE (PF) 2 MG/ML IV SOLN: 1.0000 mg | INTRAVENOUS | Status: DC | PRN

## 2019-02-21 MED ORDER — PROMETHAZINE HCL 25 MG/ML IJ SOLN: 6.2500 mg | INTRAMUSCULAR | Status: DC | PRN

## 2019-02-21 MED ORDER — BUPIVACAINE HCL (PF) 0.25 % IJ SOLN
INTRAMUSCULAR | Status: AC
  Filled 2019-02-21: qty 30

## 2019-02-21 MED ORDER — TIZANIDINE HCL 4 MG PO TABS: 4.0000 mg | ORAL_TABLET | Freq: Four times a day (QID) | ORAL | Status: DC

## 2019-02-21 MED ORDER — FENTANYL CITRATE (PF) 250 MCG/5ML IJ SOLN
INTRAMUSCULAR | Status: AC
  Filled 2019-02-21: qty 5

## 2019-02-21 MED ORDER — MIDAZOLAM HCL 5 MG/5ML IJ SOLN
INTRAMUSCULAR | Status: DC | PRN
  Administered 2019-02-21: 2 mg via INTRAVENOUS

## 2019-02-21 MED ORDER — MORPHINE SULFATE ER 30 MG PO TBCR: 60.0000 mg | EXTENDED_RELEASE_TABLET | Freq: Two times a day (BID) | ORAL | Status: DC

## 2019-02-21 MED ORDER — HEMOSTATIC AGENTS (NO CHARGE) OPTIME
TOPICAL | Status: DC | PRN
  Administered 2019-02-21: 1 via TOPICAL

## 2019-02-21 MED ORDER — EPHEDRINE SULFATE-NACL 50-0.9 MG/10ML-% IV SOSY
PREFILLED_SYRINGE | INTRAVENOUS | Status: DC | PRN
  Administered 2019-02-21 (×2): 10 mg via INTRAVENOUS
  Administered 2019-02-21: 5 mg via INTRAVENOUS

## 2019-02-21 MED ORDER — BUPIVACAINE-EPINEPHRINE 0.25% -1:200000 IJ SOLN
INTRAMUSCULAR | Status: AC
  Filled 2019-02-21: qty 1

## 2019-02-21 SURGICAL SUPPLY — 37 items
BLADE SURG 10 STRL SS (BLADE) ×2 IMPLANT
BLADE SURG 15 STRL LF DISP TIS (BLADE) ×1 IMPLANT
BLADE SURG 15 STRL SS (BLADE) ×1
COLLAR CERV LO CONTOUR FIRM DE (SOFTGOODS) ×2 IMPLANT
COVER SURGICAL LIGHT HANDLE (MISCELLANEOUS) ×3 IMPLANT
DERMABOND ADVANCED (GAUZE/BANDAGES/DRESSINGS) ×1
DERMABOND ADVANCED .7 DNX12 (GAUZE/BANDAGES/DRESSINGS) IMPLANT
DRAPE ORTHO SPLIT 77X108 STRL (DRAPES) ×2
DRAPE SURG 17X23 STRL (DRAPES) ×8 IMPLANT
DRAPE SURG ORHT 6 SPLT 77X108 (DRAPES) ×2 IMPLANT
DRSG MEPILEX BORDER 4X4 (GAUZE/BANDAGES/DRESSINGS) ×1 IMPLANT
DRSG PAD ABDOMINAL 8X10 ST (GAUZE/BANDAGES/DRESSINGS) ×6 IMPLANT
DURAPREP 6ML APPLICATOR 50/CS (WOUND CARE) ×2 IMPLANT
ELECT CAUTERY BLADE 6.4 (BLADE) ×5 IMPLANT
ELECT REM PT RETURN 9FT ADLT (ELECTROSURGICAL) ×2
ELECTRODE REM PT RTRN 9FT ADLT (ELECTROSURGICAL) ×2 IMPLANT
GLOVE BIO SURGEON STRL SZ 6.5 (GLOVE) ×3 IMPLANT
GLOVE ECLIPSE 9.0 STRL (GLOVE) ×2 IMPLANT
GLOVE ORTHO TXT STRL SZ7.5 (GLOVE) ×2 IMPLANT
GLOVE SURG 8.5 LATEX PF (GLOVE) ×2 IMPLANT
GOWN STRL REUS W/ TWL LRG LVL3 (GOWN DISPOSABLE) ×5 IMPLANT
GOWN STRL REUS W/TWL 2XL LVL3 (GOWN DISPOSABLE) ×3 IMPLANT
GOWN STRL REUS W/TWL LRG LVL3 (GOWN DISPOSABLE) ×3
KIT BASIN OR (CUSTOM PROCEDURE TRAY) ×3 IMPLANT
KIT TURNOVER KIT B (KITS) ×2 IMPLANT
NS IRRIG 1000ML POUR BTL (IV SOLUTION) ×4 IMPLANT
PACK ORTHO CERVICAL (CUSTOM PROCEDURE TRAY) ×2 IMPLANT
PATTIES SURGICAL .25X.25 (GAUZE/BANDAGES/DRESSINGS) ×2 IMPLANT
PENCIL BUTTON HOLSTER BLD 10FT (ELECTRODE) ×2 IMPLANT
SPONGE SURGIFOAM ABS GEL 100 (HEMOSTASIS) ×2 IMPLANT
SUT MNCRL AB 3-0 PS2 18 (SUTURE) ×4 IMPLANT
SUT MNCRL AB 4-0 PS2 18 (SUTURE) ×3 IMPLANT
SUT MON AB 5-0 PS2 18 (SUTURE) ×3 IMPLANT
SUT PDS AB 3-0 SH 27 (SUTURE) ×1 IMPLANT
SYR BULB IRRIGATION 50ML (SYRINGE) ×2 IMPLANT
TAPE CLOTH 4X10 WHT NS (GAUZE/BANDAGES/DRESSINGS) ×2 IMPLANT
TOWEL GREEN STERILE (TOWEL DISPOSABLE) ×3 IMPLANT

## 2019-02-21 NOTE — Discharge Instructions (Signed)
No lifting greater than 10 lbs. °Avoid bending, stooping and twisting. °Walking in house for first week then may start to get out slowly increasing activity using arms. °Keep incision dry for 3 days, may use tegaderm or similar water impervious dressing. °Avoid overhead use of arms and overhead lifting. °Wear collar for comfort. °Use ice as needed for comfort. °

## 2019-02-21 NOTE — Anesthesia Postprocedure Evaluation (Signed)
Anesthesia Post Note  Patient: Joel Galloway  Procedure(s) Performed: REMOVAL OF POSTERIOR SEGMENTAL FIXATION CERVICAL C5-C7, ASSESSMENT OF SOFT TISSUE (N/A Neck) CLOSURE OF POSTERIOR CERVICAL WOUND (N/A Neck)     Patient location during evaluation: PACU Anesthesia Type: General Level of consciousness: awake and alert Pain management: pain level controlled Vital Signs Assessment: post-procedure vital signs reviewed and stable Respiratory status: spontaneous breathing, nonlabored ventilation, respiratory function stable and patient connected to nasal cannula oxygen Cardiovascular status: blood pressure returned to baseline and stable Postop Assessment: no apparent nausea or vomiting Anesthetic complications: no    Last Vitals:  Vitals:   02/21/19 1026 02/21/19 1050  BP: 117/70 127/76  Pulse: 75 63  Resp: 15 18  Temp:    SpO2: 100% 100%    Last Pain:  Vitals:   02/21/19 1055  PainSc: 0-No pain                 Tiajuana Amass

## 2019-02-21 NOTE — Progress Notes (Signed)
Orthopedic Tech Progress Note Patient Details:  Joel Galloway May 15, 1961 [id number] Patient needed an extra soft collar for when he goes home Ortho Devices Type of Ortho Device: Soft collar Ortho Device/Splint Interventions: Other (comment)   Post Interventions Patient Tolerated: Other (comment) Instructions Provided: Other (comment)   Janit Pagan 02/21/2019, 11:13 AM

## 2019-02-21 NOTE — Interval H&P Note (Signed)
History and Physical Interval Note:  02/21/2019 7:54 AM  Joel Galloway  has presented today for surgery, with the diagnosis of painful hardware posterior cervical rods and screws.  The various methods of treatment have been discussed with the patient and family. After consideration of risks, benefits and other options for treatment, the patient has consented to  Procedure(s): REMOVAL OF POSTERIOR SEGMENTAL FIXATION CERVICAL C5-C7, ASSESSMENT OF SOFT TISSUE (N/A) CLOSURE OF POSTERIOR CERVICAL WOUND WITH POSSIBLE MUSCLE FLAP TO NECK (N/A) as a surgical intervention.  The patient's history has been reviewed, patient examined, no change in status, stable for surgery.  I have reviewed the patient's chart and labs.  Questions were answered to the patient's satisfaction.     Joel Galloway

## 2019-02-21 NOTE — Op Note (Signed)
02/21/2019  9:29 AM  PATIENT:  Joel Galloway  58 y.o. male  MRN: [id number]  OPERATIVE REPORT  PRE-OPERATIVE DIAGNOSIS:  painful hardware posterior cervical rods and screws  POST-OPERATIVE DIAGNOSIS:  painful hardware posterior cervical rods and screws  PROCEDURE:  Procedure(s): REMOVAL OF POSTERIOR SEGMENTAL FIXATION CERVICAL C5-C7, ASSESSMENT OF SOFT TISSUE CLOSURE OF POSTERIOR CERVICAL WOUND WITH POSSIBLE MUSCLE FLAP TO NECK    SURGEON:  Jessy Oto, MD     ASSISTANT:  Benjiman Core, PA-C  (Present throughout the entire procedure and necessary for completion of procedure in a timely manner)     ANESTHESIA:  General,    COMPLICATIONS:  None.     COMPONENTS:  * No implants in log *  PROCEDURE:The patient was met in the holding area, and the appropriate posterior  C5-7 cervical level identified but not  marked with "x" and my initials as it is a midline structure with bilateral exposure through previous incision scar. All questions were answered and informed consent signed. The patient was then transported to OR. The patient was then placed under general anesthesia without difficulty. He was then transferred to the operating room table prone position [place] horseshoe with Oralia Manis. All pressure points well-padded PAS stockings.Shoulders taped down and skin over the posterior inferior aspect of the neck place in traction to decrease skin folds. The patient received appropriate preoperative antibiotic 2 grams ancef. prophylaxis.Time-out procedure was called and correct.  Sterile prep with DuraPrep and draped in the usual manner the shoulders were taped downwards and skin traction over the skin of the neck. Following DuraPrep draped in the usual manner. After timeout protocol incision was made approximately C5 to C7 in the midline ellipsing the old incision scar. This following infiltration of skin and subcutaneous layers with marcaine 1% with 1-200,000 epinephrine total of 10 cc.  Incision carried through skin and subcutaneous layers using 10 blade scalpel and electrocautery down to the level of ligamentum nuchae and the spinous processes of C5, C6 and C7.. Incision made on either side on the spinous process of C5 through C7. Electrocautery then used to carefully incise the cervical muscles off the bilateral aspect of the spinous process of C5, C6 and C7. Dividing the spinous muscles off of the posterior lamina of C5 to C7. Exposed with electrocautery.  Exposure was obtained out to the lateral aspect of the lateral mass screws and rods posteriorly. Bipolar electrocautery to control bleeding. The loupe magnification and headlamp were used during this portion procedure. Cerebellar retractor was inserted. The disc Each of the pedicle fasteners was then exposed with bear rongeur and pituitary roungers and the caps removed x6. The pedicle screws then removed without difficulty. The Posterior incision then irrigated. The prominent portion of the spinous processes were resected by Dr. Marla Roe and at this point closure with mobilization of the soft tissues was under taken. Dr. Marla Roe and Elias Else completed the closure and a soft collar was applied  All instrument and sponge counts were correct. Patient was then returned to supine position on his stretcher. Returned to recovery room in satisfactory condition.     Physician assistant's responsibilities: Elias Else, PA-C perform the duties of assistant physician and surgeon during this case present from the beginning of the case to the end of the case. He assisted with careful retraction of neural structures suctioning about his posterior elements including cervical cord.. Performed closure of the incision on the ligamentum nuchae to the skin and application of dressing. He assisted  in positioning the patient had removal the patient from the OR table to her stretcher. Dr. Lyndee Leo Dillingham perform the duties of cosurgeon on this case  and assisted with the removal of hardware and the performed a  Meticulous plastic surgery closure.           Sajan Cheatwood  02/21/2019, 9:29 AM
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
## 2019-02-21 NOTE — Transfer of Care (Signed)
Immediate Anesthesia Transfer of Care Note  Patient: Joel Galloway  Procedure(s) Performed: REMOVAL OF POSTERIOR SEGMENTAL FIXATION CERVICAL C5-C7, ASSESSMENT OF SOFT TISSUE (N/A Neck) CLOSURE OF POSTERIOR CERVICAL WOUND (N/A Neck)  Patient Location: PACU  Anesthesia Type:General  Level of Consciousness: awake, alert  and oriented  Airway & Oxygen Therapy: Patient Spontanous Breathing  Post-op Assessment: Report given to RN and Post -op Vital signs reviewed and stable  Post vital signs: Reviewed and stable  Last Vitals:  Vitals Value Taken Time  BP 167/99 02/21/19 0956  Temp    Pulse 66 02/21/19 1000  Resp 8 02/21/19 1000  SpO2 97 % 02/21/19 1000  Vitals shown include unvalidated device data.  Last Pain:  Vitals:   02/21/19 0706  PainSc: 5          Complications: No apparent anesthesia complications

## 2019-02-21 NOTE — Brief Op Note (Signed)
02/21/2019  9:23 AM  PATIENT:  Joel Galloway  58 y.o. male  PRE-OPERATIVE DIAGNOSIS:  painful hardware posterior cervical rods and screws  POST-OPERATIVE DIAGNOSIS:  painful hardware posterior cervical rods and screws  PROCEDURE:  Procedure(s): REMOVAL OF POSTERIOR SEGMENTAL FIXATION CERVICAL C5-C7, ASSESSMENT OF SOFT TISSUE (N/A) CLOSURE OF POSTERIOR CERVICAL WOUND WITH POSSIBLE MUSCLE FLAP TO NECK (N/A)  SURGEON:  Surgeon(s) and Role: Panel 1:    * Jessy Oto, MD - Primary Panel 2:    * Dillingham, Loel Lofty, DO - Primary  PHYSICIAN ASSISTANT: Matt , PA-C   ANESTHESIA:   local and general, Dr. Ola Spurr  EBL: 25CC  BLOOD ADMINISTERED:none  DRAINS: none   LOCAL MEDICATIONS USED:  MARCAINE 0.5% with 1: 10,000 Epinephrin Amount: 5cc ml  SPECIMEN:  No Specimen  DISPOSITION OF SPECIMEN:  N/A  COUNTS:  YES  TOURNIQUET:  * No tourniquets in log *  DICTATION: .Dragon Dictation  PLAN OF CARE: Admit for overnight observation  PATIENT DISPOSITION:  PACU - hemodynamically stable.   Delay start of Pharmacological VTE agent (>24hrs) due to surgical blood loss or risk of bleeding: yes

## 2019-02-21 NOTE — Interval H&P Note (Signed)
History and Physical Interval Note:  02/21/2019 7:31 AM  Joel Galloway  has presented today for surgery, with the diagnosis of painful hardware posterior cervical rods and screws.  The various methods of treatment have been discussed with the patient and family. After consideration of risks, benefits and other options for treatment, the patient has consented to  Procedure(s): REMOVAL OF POSTERIOR SEGMENTAL FIXATION CERVICAL C5-C7, ASSESSMENT OF SOFT TISSUE (N/A) CLOSURE OF POSTERIOR CERVICAL WOUND WITH POSSIBLE MUSCLE FLAP TO NECK (N/A) as a surgical intervention.  The patient's history has been reviewed, patient examined, no change in status, stable for surgery.  I have reviewed the patient's chart and labs.  Questions were answered to the patient's satisfaction.     [name]

## 2019-02-21 NOTE — Anesthesia Procedure Notes (Signed)
Procedure Name: Intubation Performed by: Suzette Battiest, MD Pre-anesthesia Checklist: Patient identified, Emergency Drugs available, Suction available and Patient being monitored Patient Re-evaluated:Patient Re-evaluated prior to induction Oxygen Delivery Method: Circle system utilized Preoxygenation: Pre-oxygenation with 100% oxygen Induction Type: IV induction Ventilation: Mask ventilation without difficulty Laryngoscope Size: Glidescope and 4 Grade View: Grade I Tube type: Oral Tube size: 7.5 mm Number of attempts: 1 Airway Equipment and Method: Stylet and Video-laryngoscopy Placement Confirmation: ETT inserted through vocal cords under direct vision,  positive ETCO2,  CO2 detector and breath sounds checked- equal and bilateral Tube secured with: Tape Dental Injury: Teeth and Oropharynx as per pre-operative assessment

## 2019-02-21 NOTE — Evaluation (Signed)
Physical Therapy Evaluation and Discharge Patient Details Name: Joel Galloway MRN: [id number] DOB: May 13, 1961 Today's Date: 02/21/2019   History of Present Illness  Pt is a 58 y/o male s/p removal of painful cervical hardware. PMH includes asthma, skin cancer, CAD, seizures, CVA, and s/p ACDF.   Clinical Impression  Patient evaluated by Physical Therapy with no further acute PT needs identified. All education has been completed and the patient has no further questions. Pt overall steady with gait. Supervision to independent with all mobility tasks. Educated about generalized walking program, cervical precautions, and how to maintain precautions during ADLs. Reports wife will be able to assist if needed. See below for any follow-up Physical Therapy or equipment needs. PT is signing off. Thank you for this referral. If needs change, please re-consult.      Follow Up Recommendations No PT follow up    Equipment Recommendations  None recommended by PT    Recommendations for Other Services       Precautions / Restrictions Precautions Precautions: Cervical Precaution Booklet Issued: Yes (comment) Precaution Comments: Reviewed cervical precautions with pt, however, pt reports he is familiar as he has had multiple surgeries.  Required Braces or Orthoses: Cervical Brace Cervical Brace: Soft collar Restrictions Weight Bearing Restrictions: No      Mobility  Bed Mobility               General bed mobility comments: Sitting EOB upon entry   Transfers Overall transfer level: Independent Equipment used: None                Ambulation/Gait Ambulation/Gait assistance: Supervision Gait Distance (Feet): 300 Feet Assistive device: None Gait Pattern/deviations: WFL(Within Functional Limits) Gait velocity: WFL    General Gait Details: Steady gait, and good gait speed. No LOB noted. Educated about generalized walking program to perform at home.   Stairs             Wheelchair Mobility    Modified Rankin (Stroke Patients Only)       Balance Overall balance assessment: No apparent balance deficits (not formally assessed)                                           Pertinent Vitals/Pain Pain Assessment: Faces Faces Pain Scale: Hurts a little bit Pain Location: neck Pain Descriptors / Indicators: Aching;Operative site guarding Pain Intervention(s): Limited activity within patient's tolerance;Monitored during session;Repositioned    Home Living Family/patient expects to be discharged to:: Private residence Living Arrangements: Spouse/significant other Available Help at Discharge: Family Type of Home: House Home Access: Level entry     Home Layout: One level Home Equipment: None      Prior Function Level of Independence: Independent               Hand Dominance        Extremity/Trunk Assessment   Upper Extremity Assessment Upper Extremity Assessment: Overall WFL for tasks assessed    Lower Extremity Assessment Lower Extremity Assessment: Overall WFL for tasks assessed    Cervical / Trunk Assessment Cervical / Trunk Assessment: Other exceptions Cervical / Trunk Exceptions: s/p cervical surgery   Communication   Communication: No difficulties  Cognition Arousal/Alertness: Awake/alert Behavior During Therapy: WFL for tasks assessed/performed Overall Cognitive Status: Within Functional Limits for tasks assessed  General Comments General comments (skin integrity, edema, etc.): Pt's wife present during session. Educated about how to maintain precautions during ADL tasks.     Exercises     Assessment/Plan    PT Assessment Patent does not need any further PT services  PT Problem List         PT Treatment Interventions      PT Goals (Current goals can be found in the Care Plan section)  Acute Rehab PT Goals Patient Stated Goal: to go home  today PT Goal Formulation: With patient Time For Goal Achievement: 02/21/19 Potential to Achieve Goals: Good    Frequency     Barriers to discharge        Co-evaluation               AM-PAC PT "6 Clicks" Mobility  Outcome Measure Help needed turning from your back to your side while in a flat bed without using bedrails?: None Help needed moving from lying on your back to sitting on the side of a flat bed without using bedrails?: None Help needed moving to and from a bed to a chair (including a wheelchair)?: None Help needed standing up from a chair using your arms (e.g., wheelchair or bedside chair)?: None Help needed to walk in hospital room?: None Help needed climbing 3-5 steps with a railing? : A Little 6 Click Score: 23    End of Session Equipment Utilized During Treatment: Cervical collar Activity Tolerance: Patient tolerated treatment well Patient left: in bed;with call bell/phone within reach;with family/visitor present(sitting EOB ) Nurse Communication: Mobility status PT Visit Diagnosis: Other symptoms and signs involving the nervous system [id number])    Time: [id number] PT Time Calculation (min) (ACUTE ONLY): 10 min   Charges:   PT Evaluation $PT Eval Low Complexity: 1 Low          Joel Galloway, PT, DPT  Acute Rehabilitation Services  Pager: [phone] Office: [phone]   Joel Galloway 02/21/2019, 12:16 PM

## 2019-02-21 NOTE — OR Nursing (Signed)
HARDWARE WASHED AT THE FIELD, SENT IN CUP AND LABELED BIOHAZARD BAG WITH PATIENT TO PACU PER MD

## 2019-02-21 NOTE — Op Note (Signed)
DATE OF OPERATION: 02/21/2019  LOCATION: [facility]  PREOPERATIVE DIAGNOSIS: neck muscle dehiscence   POSTOPERATIVE DIAGNOSIS: Same  PROCEDURE: Closure of neck with tissue advancement of muscle 3 x 8 cm  SURGEON: Lucella Pommier Sanger Radha Coggins, DO  ASSISTANT: Roetta Sessions, PA  EBL: 5 cc  CONDITION: Stable  COMPLICATIONS: None  INDICATION: The patient, Cassanova, is a 58 y.o. male born on 11/19/1960, is here for treatment of a neck muscle dehiscense.   PROCEDURE DETAILS:  The patient was seen prior to surgery and marked.  The IV antibiotics were given. The patient was taken to the operating room and given a general anesthetic. A standard time out was performed and all information was confirmed by those in the room. SCDs were placed.  The patient underwent general anesthesia and was placed on the operating room table in the prone position.  Dr. Louanne Skye performed his portion of the case for which I assisted by holding retractors and providing assistance throughout the case.    The superior portion of the trapezius and the inferior portion of the semispinalis capitis was released from the soft tissue layer.  Hemostasis was achieved with electrocautery.  The bovie was used to provide the release.  The muscles were advanced to midline.  They were secured with the 3-0 Monocryl and 3-0 PDS.  This provided closure of the 3 x 8 cm area. There was no bleeding noted. The next layer was closed with the 4-0 Monocryl followed by the skin closure with the 5-0 Monocryl.  Derma bond was applied.  A sterile dressing was placed.     The patient was allowed to wake up and taken to recovery room in stable condition at the end of the case. The family was notified at the end of the case.   The advanced practice practitioner (APP) assisted throughout the case.  The APP was essential in retraction and counter traction when needed to make the case progress smoothly.  This retraction and  assistance made it possible to see the tissue plans for the procedure.  The assistance was needed for blood control, tissue re-approximation and assisted with closure of the incision site.

## 2019-02-21 NOTE — Progress Notes (Signed)
Pt doing well. Pt given D/C instructions with verbal understanding. Rx was sent to pharmacy by MD. Pt's incision is clean and dry with no sign of infection. Pt's IV was removed prior to D/C. Pt D/C'd home via wheelchair per MD Order. Pt is stable @ D/C and has no other needs at this time. Holli Humbles, RN

## 2019-02-22 ENCOUNTER — Encounter (HOSPITAL_COMMUNITY): Payer: Self-pay | Admitting: Specialist

## 2019-02-28 ENCOUNTER — Telehealth: Payer: Self-pay

## 2019-02-28 NOTE — Telephone Encounter (Signed)

## 2019-03-01 ENCOUNTER — Other Ambulatory Visit: Payer: Self-pay

## 2019-03-01 ENCOUNTER — Ambulatory Visit (INDEPENDENT_AMBULATORY_CARE_PROVIDER_SITE_OTHER): Payer: 59 | Admitting: Surgical

## 2019-03-01 ENCOUNTER — Encounter: Payer: Self-pay | Admitting: Surgical

## 2019-03-01 VITALS — BP 121/75 | HR 65 | Temp 97.1°F | Ht 72.0 in | Wt 179.2 lb

## 2019-03-01 DIAGNOSIS — Z981 Arthrodesis status: Secondary | ICD-10-CM

## 2019-03-01 NOTE — Progress Notes (Signed)
   Subjective:     Patient ID: Joel Galloway, male    DOB: 22-Nov-1960, 58 y.o.   MRN: [id number]  Chief Complaint  Patient presents with  . Post-op Follow-up    for removal of posterior segmental fixation cervical C5-C7 assessment of soft tissue    HPI: The patient is a 58 y.o. male here for follow-up after closure of posterior cervical wound by Dr. Marla Roe after removal of posterior cervical neck c5-c7 hardware by Dr. Louanne Skye on 02/21/19. He is POD#8.  Today he is doing well overall. He reports his pain improved quickly after surgery and he is feeling better than he was pre-op. He has good ROM. Incision c/d/i, no drainage, no dehiscence noted.  He reports Sunday he was bending over tying his shoe and felt a sudden pop and tearing sensation in his upper left midline back. He then noted some swelling over the next day or so. He reports it is still tender from this but improving. He has no decreased ROM and minimal pain on palpation at the inferior aspect of incision just left to midline.  No fevers, chills, n/v. No other complaints. No focal weakness.  Review of Systems  Constitutional: Negative for chills, diaphoresis, fever, malaise/fatigue and weight loss.  Respiratory: Negative for cough, sputum production and shortness of breath.   Cardiovascular: Negative for chest pain, leg swelling and PND.  Gastrointestinal: Negative for abdominal pain, diarrhea, nausea and vomiting.  Genitourinary: Negative.   Musculoskeletal: Positive for myalgias. Negative for back pain and neck pain.  Skin: Negative for itching and rash.  Neurological: Negative for dizziness, focal weakness, weakness and headaches.     Objective:   Vital Signs BP 121/75 (BP Location: Left Arm, Patient Position: Sitting, Cuff Size: Normal)   Pulse 65   Temp (!) 97.1 F (36.2 C) (Temporal)   Ht 6' (1.829 m)   Wt 179 lb 3.2 oz (81.3 kg)   SpO2 99%   BMI 24.30 kg/m  Vital Signs and Nursing Note Reviewed  Physical  Exam  Constitutional: He is oriented to person, place, and time and well-developed, well-nourished, and in no distress.  HENT:  Head: Normocephalic and atraumatic.  Neck:    Cardiovascular: Normal rate.  Pulmonary/Chest: Effort normal.  Musculoskeletal: Normal range of motion.  Neurological: He is alert and oriented to person, place, and time. Gait normal.  Skin: Skin is warm and dry. No rash noted. He is not diaphoretic. No erythema. No pallor.  Psychiatric: Mood and affect normal.      Assessment/Plan:     ICD-10-CM   1. S/P cervical spinal fusion  Z98.1     Joel Galloway is healing well.   In regards to the pop/tearing sensation, it is possible that some of the muscle fibers tore or he popped an internal suture due to excessive movement, but he does not have any decrease in function, swelling or decrease in ROM. Continue to monitor.  Sutures left in place - can be removed in 1 week or allow to dissolve on their own. He is going to see Dr. Louanne Skye in 1 week.  Follow up in 1 month for re-evaluation. He is also interested in reconstruction surgery and would like to talk more about this in 1 month.   Carola Rhine Victorious Kundinger, PA-C 03/01/2019, 8:42 AM
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
## 2019-03-10 ENCOUNTER — Ambulatory Visit (INDEPENDENT_AMBULATORY_CARE_PROVIDER_SITE_OTHER): Payer: 59

## 2019-03-10 ENCOUNTER — Encounter: Payer: Self-pay | Admitting: Specialist

## 2019-03-10 ENCOUNTER — Telehealth: Payer: Self-pay | Admitting: Orthopaedic Surgery

## 2019-03-10 ENCOUNTER — Other Ambulatory Visit: Payer: Self-pay

## 2019-03-10 ENCOUNTER — Ambulatory Visit (INDEPENDENT_AMBULATORY_CARE_PROVIDER_SITE_OTHER): Payer: 59 | Admitting: Specialist

## 2019-03-10 VITALS — BP 125/75 | HR 60 | Ht 72.0 in | Wt 195.0 lb

## 2019-03-10 DIAGNOSIS — M4322 Fusion of spine, cervical region: Secondary | ICD-10-CM

## 2019-03-10 MED ORDER — MUPIROCIN 2 % EX OINT: TOPICAL_OINTMENT | CUTANEOUS | 0 refills | Status: AC
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
## 2019-03-10 NOTE — Telephone Encounter (Signed)
Can we order him a gel injection please

## 2019-03-10 NOTE — Telephone Encounter (Signed)
Noted  

## 2019-03-10 NOTE — Telephone Encounter (Signed)
Patient would like to get another gel injection.  CB#[phone].  Thank you.
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
## 2019-03-18 ENCOUNTER — Telehealth: Payer: Self-pay | Admitting: Radiology

## 2019-03-18 NOTE — Telephone Encounter (Signed)
Bilateral knees, Durolane, [place], no appt yet, last Durolane inj 07/12/18.

## 2019-03-22 NOTE — Telephone Encounter (Signed)
Patient scheduled per Joycelyn Schmid, per VOB ran this calendar year for insurance plan, no PA needed.  Plan covers 80% meds and 100% [id number].

## 2019-03-29 ENCOUNTER — Encounter: Payer: Self-pay | Admitting: Orthopaedic Surgery

## 2019-03-29 ENCOUNTER — Other Ambulatory Visit: Payer: Self-pay

## 2019-03-29 ENCOUNTER — Ambulatory Visit (INDEPENDENT_AMBULATORY_CARE_PROVIDER_SITE_OTHER): Payer: 59 | Admitting: Orthopaedic Surgery

## 2019-03-29 DIAGNOSIS — M1711 Unilateral primary osteoarthritis, right knee: Secondary | ICD-10-CM | POA: Diagnosis not present

## 2019-03-29 DIAGNOSIS — M1712 Unilateral primary osteoarthritis, left knee: Secondary | ICD-10-CM | POA: Diagnosis not present

## 2019-03-29 MED ORDER — SODIUM HYALURONATE 60 MG/3ML IX PRSY
60.0000 mg | PREFILLED_SYRINGE | INTRA_ARTICULAR | Status: AC | PRN
  Administered 2019-03-29: 60 mg via INTRA_ARTICULAR

## 2019-03-29 MED ORDER — SODIUM HYALURONATE 60 MG/3ML IX PRSY
60.0000 mg | PREFILLED_SYRINGE | INTRA_ARTICULAR | Status: AC | PRN
  Administered 2019-03-29: 10:00:00 60 mg via INTRA_ARTICULAR

## 2019-03-29 NOTE — Progress Notes (Signed)
   Procedure Note  Patient: Joel Galloway             Date of Birth: June 01, 1960           MRN: [id number]             Visit Date: 03/29/2019  Procedures: Visit Diagnoses:  1. Unilateral primary osteoarthritis, left knee   2. Unilateral primary osteoarthritis, right knee     Large Joint Inj: R knee on 03/29/2019 9:53 AM Indications: diagnostic evaluation and pain Details: 22 G 1.5 in needle, superolateral approach  Arthrogram: No  Medications: 60 mg Sodium Hyaluronate 60 MG/3ML Outcome: tolerated well, no immediate complications Procedure, treatment alternatives, risks and benefits explained, specific risks discussed. Consent was given by the patient. Immediately prior to procedure a time out was called to verify the correct patient, procedure, equipment, support staff and site/side marked as required. Patient was prepped and draped in the usual sterile fashion.   Large Joint Inj: L knee on 03/29/2019 9:54 AM Indications: diagnostic evaluation and pain Details: 22 G 1.5 in needle, superolateral approach  Arthrogram: No  Medications: 60 mg Sodium Hyaluronate 60 MG/3ML Outcome: tolerated well, no immediate complications Procedure, treatment alternatives, risks and benefits explained, specific risks discussed. Consent was given by the patient. Immediately prior to procedure a time out was called to verify the correct patient, procedure, equipment, support staff and site/side marked as required. Patient was prepped and draped in the usual sterile fashion.    The patient is here today for scheduled hyaluronic acid injections in both knees to treat the pain from osteoarthritis.  Steroid injections have failed to help.  He has had good relief from hyaluronic acid in the past.  Examination of his knee shows no effusion with either knee.  He does have global tenderness but excellent range of motion.  Both knees are ligamentously stable.  I did place an injection of Durolane in each knee  which he tolerated well.  All question concerns were answered and addressed.  Follow-up will be as needed.

## 2019-04-05 ENCOUNTER — Ambulatory Visit: Payer: 59 | Admitting: Plastic Surgery

## 2019-04-12 ENCOUNTER — Ambulatory Visit: Payer: 59 | Admitting: Nurse Practitioner

## 2019-04-19 ENCOUNTER — Other Ambulatory Visit: Payer: Self-pay

## 2019-04-21 ENCOUNTER — Ambulatory Visit: Payer: 59 | Admitting: Specialist

## 2019-04-22 ENCOUNTER — Other Ambulatory Visit: Payer: Self-pay

## 2019-04-22 MED ORDER — METOPROLOL SUCCINATE ER 100 MG PO TB24: 100.0000 mg | ORAL_TABLET | Freq: Two times a day (BID) | ORAL | 1 refills | Status: DC

## 2019-05-10 ENCOUNTER — Ambulatory Visit (INDEPENDENT_AMBULATORY_CARE_PROVIDER_SITE_OTHER): Payer: 59 | Admitting: Plastic Surgery

## 2019-05-10 ENCOUNTER — Encounter: Payer: Self-pay | Admitting: Plastic Surgery

## 2019-05-10 ENCOUNTER — Other Ambulatory Visit: Payer: Self-pay

## 2019-05-10 VITALS — BP 136/78 | HR 70 | Temp 97.3°F | Ht 72.0 in | Wt 177.6 lb

## 2019-05-10 DIAGNOSIS — M4722 Other spondylosis with radiculopathy, cervical region: Secondary | ICD-10-CM

## 2019-05-10 DIAGNOSIS — Z981 Arthrodesis status: Secondary | ICD-10-CM

## 2019-05-10 NOTE — Progress Notes (Signed)
   Subjective:    Patient ID: Joel Galloway, male    DOB: 06-29-1960, 58 y.o.   MRN: [id number]  The patient is a 58 year old male here for follow-up after undergoing surgery on his neck.  The patient had a cervical fusion with some complications.  The hardware was still in place.  There was concerned about imminent exposure.  The patient went to the operating room with Dr. Louanne Skye and myself.  The hardware was removed and the muscles were advanced to the midline.  He had a very nice closure.  There is no sign of infection.  He had Botox placed preoperatively to help with decreasing the pole from his muscles.  He has gotten back to his usual daily activities.  He noted a pop and the muscle separated.  On exam today the skin is closed and looks healthy.  He has extreme muscle tightness and triggering.  Even spasms with palpation.  He does see a pain physician and did get some improvement when he went for massage preoperatively.     Review of Systems  Constitutional: Negative for appetite change.  Eyes: Negative.   Respiratory: Negative for chest tightness.   Cardiovascular: Negative.   Gastrointestinal: Negative.   Genitourinary: Negative.   Musculoskeletal: Positive for back pain and neck pain.       Objective:   Physical Exam Constitutional:      Appearance: Normal appearance.  HENT:     Head: Normocephalic.  Cardiovascular:     Rate and Rhythm: Normal rate.  Pulmonary:     Effort: Pulmonary effort is normal.  Neurological:     Mental Status: He is alert. Mental status is at baseline.  Psychiatric:        Mood and Affect: Mood normal.        Behavior: Behavior normal.        Assessment & Plan:     ICD-10-CM   1. S/P cervical spinal fusion  Z98.1   2. Other spondylosis with radiculopathy, cervical region  M47.22     The muscles are so tight I have doubts that they could be readvanced without breaking again.  This leads him to the possibility of a muscle flap that is either  adjacent or free flap.  This is not something that I do.  I have expressed this to the patient.  I recommend continue massage and physical therapy.  He was open to massage but not as much physical therapy.  If he decides on a advanced muscle flaps I would be happy to provide him with possible locations to have that done.  Pictures were obtained of the patient and placed in the chart with the patient's or guardian's permission.  The [place] was signed into law in 2016 which includes the topic of electronic health records.  This provides immediate access to information in MyChart.  This includes consultation notes, operative notes, office notes, lab results and pathology reports.  If you have any questions about what you read please let us know at your next visit or call us at the office.  We are right here with you.

## 2019-05-19 ENCOUNTER — Ambulatory Visit: Payer: 59 | Admitting: Surgery

## 2019-05-26 ENCOUNTER — Ambulatory Visit: Payer: 59 | Admitting: Surgery

## 2019-06-09 ENCOUNTER — Ambulatory Visit: Payer: 59 | Admitting: Family Medicine

## 2019-06-10 ENCOUNTER — Other Ambulatory Visit: Payer: Self-pay

## 2019-06-10 ENCOUNTER — Ambulatory Visit: Payer: Self-pay

## 2019-06-10 ENCOUNTER — Encounter: Payer: Self-pay | Admitting: Specialist

## 2019-06-10 ENCOUNTER — Ambulatory Visit (INDEPENDENT_AMBULATORY_CARE_PROVIDER_SITE_OTHER): Payer: 59 | Admitting: Specialist

## 2019-06-10 VITALS — BP 125/75 | HR 65 | Ht 72.0 in | Wt 195.0 lb

## 2019-06-10 DIAGNOSIS — M542 Cervicalgia: Secondary | ICD-10-CM | POA: Diagnosis not present

## 2019-06-10 DIAGNOSIS — T8131XA Disruption of external operation (surgical) wound, not elsewhere classified, initial encounter: Secondary | ICD-10-CM | POA: Diagnosis not present

## 2019-06-10 DIAGNOSIS — M4322 Fusion of spine, cervical region: Secondary | ICD-10-CM | POA: Diagnosis not present

## 2019-06-10 DIAGNOSIS — M4722 Other spondylosis with radiculopathy, cervical region: Secondary | ICD-10-CM | POA: Diagnosis not present
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
## 2019-06-10 NOTE — Patient Instructions (Addendum)
Avoid overhead lifting and overhead use of the arms. Do not lift greater than 5-10 lbs. Adjust head rest in vehicle to prevent hyperextension if rear ended. Take extra precautions to avoid falling, including use of a cane if you feel weak. At this point we can refer you to Dr. Crissie Reese a plastic for an evaluation and treatment of the dehiscence of the central areas of the soft tissue and muscle attachments to the mid portions of the posterior spinous processes C5 through T1.  I

## 2019-06-17 ENCOUNTER — Telehealth: Payer: Self-pay | Admitting: *Deleted

## 2019-06-17 NOTE — Telephone Encounter (Signed)
-----   Message from [facility], [place] sent at 06/15/2019 12:20 PM EST ----- So I asked Dr. Louanne Skye about this, he states that Dr. Marla Roe does not do the "flaps" that the patient needs done, so Dr. Harlow Mares has helped Dr. Louanne Skye in the past with patients that he needed these "flaps" done.  This is why he sent him to Dr. Harlow Mares office. ----- Message ----- From: Maureen Chatters, CMA Sent: 06/15/2019   9:26 AM EST To: Doretha Imus, RT  Is there a reason why Dr. Louanne Skye is wanting pt go to Dr. Harlow Mares for plastic surgery? Reason asking is referral was sent to them and they refused the pt stating needs to go to [place] but I thought I read in notes he wanted him to go to [place], wasn't sure if there was reason behind it. Please advise.

## 2019-06-17 NOTE — Telephone Encounter (Signed)
I called over to [facility] and sw Charity and she states Dr. Harlow Mares will not see pt's who have seen other Plastic surgeons for liability reasons, and that pt would need to go back to Dr. Marla Roe. I explained to charity that per notes Dr. Marla Roe does not do the 'Flaps" that pt needs and Dr. Harlow Mares has helped before. Carloyn Jaeger is going to send note to her Glass blower/designer and explain what is going on and will try to get back with me later Monday.

## 2019-11-07 ENCOUNTER — Ambulatory Visit: Payer: Self-pay

## 2019-11-07 ENCOUNTER — Other Ambulatory Visit: Payer: Self-pay

## 2019-11-07 ENCOUNTER — Encounter: Payer: Self-pay | Admitting: Orthopaedic Surgery

## 2019-11-07 ENCOUNTER — Telehealth: Payer: Self-pay | Admitting: Orthopaedic Surgery

## 2019-11-07 ENCOUNTER — Ambulatory Visit (INDEPENDENT_AMBULATORY_CARE_PROVIDER_SITE_OTHER): Payer: Medicare Other | Admitting: Orthopaedic Surgery

## 2019-11-07 DIAGNOSIS — M25551 Pain in right hip: Secondary | ICD-10-CM | POA: Diagnosis not present

## 2019-11-07 DIAGNOSIS — M545 Low back pain, unspecified: Secondary | ICD-10-CM

## 2019-11-07 DIAGNOSIS — G8929 Other chronic pain: Secondary | ICD-10-CM

## 2019-11-07 DIAGNOSIS — M1712 Unilateral primary osteoarthritis, left knee: Secondary | ICD-10-CM | POA: Diagnosis not present

## 2019-11-07 MED ORDER — SODIUM HYALURONATE 60 MG/3ML IX PRSY
60.0000 mg | PREFILLED_SYRINGE | INTRA_ARTICULAR | Status: AC | PRN
  Administered 2019-11-07: 60 mg via INTRA_ARTICULAR
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
## 2019-11-07 NOTE — Telephone Encounter (Signed)
Pt would like the X-Rays taken on 11/07/19 faxed to DR. Hardin Negus.  Fax# [phone]

## 2019-11-08 NOTE — Telephone Encounter (Signed)
LMOM for patient letting him know we can't fax the actual xrays but we can put them on a disc for him or I can fax the actual dictation of the visit

## 2019-11-09 ENCOUNTER — Other Ambulatory Visit: Payer: Self-pay

## 2019-11-09 ENCOUNTER — Ambulatory Visit: Payer: Self-pay

## 2019-11-09 ENCOUNTER — Encounter: Payer: Self-pay | Admitting: Specialist

## 2019-11-09 ENCOUNTER — Ambulatory Visit: Payer: Medicare Other | Admitting: Specialist

## 2019-11-09 VITALS — BP 107/75 | HR 64 | Ht 72.0 in | Wt 195.0 lb

## 2019-11-09 DIAGNOSIS — M47812 Spondylosis without myelopathy or radiculopathy, cervical region: Secondary | ICD-10-CM

## 2019-11-09 DIAGNOSIS — M5136 Other intervertebral disc degeneration, lumbar region: Secondary | ICD-10-CM | POA: Diagnosis not present

## 2019-11-09 DIAGNOSIS — M4327 Fusion of spine, lumbosacral region: Secondary | ICD-10-CM

## 2019-11-09 DIAGNOSIS — M545 Low back pain, unspecified: Secondary | ICD-10-CM

## 2019-11-09 DIAGNOSIS — M4316 Spondylolisthesis, lumbar region: Secondary | ICD-10-CM

## 2019-11-09 DIAGNOSIS — M4322 Fusion of spine, cervical region: Secondary | ICD-10-CM

## 2019-11-09 DIAGNOSIS — M47816 Spondylosis without myelopathy or radiculopathy, lumbar region: Secondary | ICD-10-CM

## 2019-11-09 DIAGNOSIS — M51369 Other intervertebral disc degeneration, lumbar region without mention of lumbar back pain or lower extremity pain: Secondary | ICD-10-CM

## 2019-11-09 DIAGNOSIS — G8929 Other chronic pain: Secondary | ICD-10-CM
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
## 2019-11-09 NOTE — Patient Instructions (Signed)
Avoid frequent bending and stooping  No lifting greater than 10 lbs. May use ice or moist heat for pain. Weight loss is of benefit.  Exercise is important to improve your indurance and does allow people to function better inspite of back pain. 

## 2019-11-16 ENCOUNTER — Telehealth: Payer: Self-pay | Admitting: Specialist

## 2019-11-16 NOTE — Telephone Encounter (Signed)
Pt called stating he was supposed to have an appt at pain management with a Dr. Doren Custard per Dr.Nitka and and that wasn't scheduled; pt also stated that he was supposed to have an appt with Dr.Newton but no one ever called him; and lastly the pt states an MRI was supposed to be ordered for [place]. Pt would like a CB but doesn't want to speak with christy; pt also states he doesn't want christy in the loop on this message due to her being rude and "brushing off his pain".   [phone]

## 2019-11-17 NOTE — Telephone Encounter (Signed)
I talked with patient. He states that you had discussed with him at his last OV about getting an MRI with sedation? If so this will need to be done in [place]? Also states you mentioned to him about going to see Dr Ernestina Patches? Patient already sees Dr Hardin Negus. Please advise. Thanks.

## 2019-11-17 NOTE — Telephone Encounter (Signed)
Pt states he just called Dr.Phillips office and they are saying they still haven't received a referral and asked that we resend it.   [phone]

## 2019-11-18 ENCOUNTER — Other Ambulatory Visit: Payer: Self-pay | Admitting: Specialist

## 2019-11-18 DIAGNOSIS — M542 Cervicalgia: Secondary | ICD-10-CM

## 2019-11-18 DIAGNOSIS — M5442 Lumbago with sciatica, left side: Secondary | ICD-10-CM

## 2019-11-18 DIAGNOSIS — G8929 Other chronic pain: Secondary | ICD-10-CM

## 2019-11-18 NOTE — Telephone Encounter (Signed)
I left voicemail advising. ?

## 2019-11-18 NOTE — Telephone Encounter (Signed)
Referral placed. jen

## 2019-11-18 NOTE — Telephone Encounter (Signed)
Please advise 

## 2019-11-18 NOTE — Telephone Encounter (Signed)
Could you please have Dr. Louanne Skye advise this morning? It looks like Lauren sent him the message yesterday. Thanks.

## 2019-12-01 ENCOUNTER — Telehealth: Payer: Self-pay

## 2019-12-01 ENCOUNTER — Other Ambulatory Visit: Payer: Self-pay | Admitting: Specialist

## 2019-12-01 DIAGNOSIS — Z4889 Encounter for other specified surgical aftercare: Secondary | ICD-10-CM

## 2019-12-01 NOTE — Telephone Encounter (Signed)
I reviewed his record and radiographs, he has accelerated degenerative disc changes at L4-5 above the L5-S1 fusion with disc height narrowing, endplate sclerosis and suggestion of anterolisthesis. I will order the MRI, previously the decision was for pain management but it sounds like He is having significant mechanical and neurogenic troubles that may need to be addressed. MRI with and without contrast under general anesthetic to be done at [facility]. I know he wanted it done in [place] but I do not have priviledges in [place] and can not order a general anethetic at the [facility]. If he knows anyone there locally that has admission priviledges at the hospital in [place], they could write that order.

## 2019-12-01 NOTE — Telephone Encounter (Signed)
Patient called in saying he has called multiple times trying to get his MRI done says he has not been contacted to  Get it scheduled and says he is in a lot of pain.

## 2019-12-01 NOTE — Telephone Encounter (Signed)
I called patient, he states that he was supposed to be getting an MRI of his back under anesthesia because he cannot lay down but there is nothing that been order and there is nothing noted in chart in the office note, can you please advise on this?

## 2019-12-02 NOTE — Telephone Encounter (Signed)
Tried calling to advise per Dr Otho Ket message below. No answer.

## 2019-12-15 ENCOUNTER — Ambulatory Visit: Payer: Medicare Other | Admitting: Specialist

## 2020-01-09 ENCOUNTER — Ambulatory Visit: Payer: Medicare Other | Admitting: Orthopaedic Surgery

## 2020-01-17 ENCOUNTER — Ambulatory Visit: Payer: Medicare Other | Admitting: Orthopaedic Surgery

## 2020-01-17 DIAGNOSIS — G8929 Other chronic pain: Secondary | ICD-10-CM | POA: Diagnosis not present

## 2020-01-17 DIAGNOSIS — M25562 Pain in left knee: Secondary | ICD-10-CM

## 2020-01-17 DIAGNOSIS — M25511 Pain in right shoulder: Secondary | ICD-10-CM | POA: Diagnosis not present

## 2020-01-17 MED ORDER — LIDOCAINE HCL 1 % IJ SOLN
3.0000 mL | INTRAMUSCULAR | Status: AC | PRN
  Administered 2020-01-17: 3 mL

## 2020-01-17 MED ORDER — METHYLPREDNISOLONE ACETATE 40 MG/ML IJ SUSP
40.0000 mg | INTRAMUSCULAR | Status: AC | PRN
  Administered 2020-01-17: 40 mg via INTRA_ARTICULAR
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
## 2020-01-18 ENCOUNTER — Ambulatory Visit: Payer: Medicare Other | Admitting: Orthopaedic Surgery

## 2020-01-20 ENCOUNTER — Encounter: Payer: Self-pay | Admitting: Radiology

## 2020-02-01 ENCOUNTER — Telehealth: Payer: Self-pay | Admitting: Specialist

## 2020-02-01 NOTE — Telephone Encounter (Signed)
Pt called wanting to speak with Randall Hiss when I asked what I could help him with he said only Randall Hiss could help him and he asked for his direct line in case they got disconnected but I told him no I cant give out direct lines then he got mad. Pt stated it's been given to him before he wanted my name so I gave it to him and then he kept insisting to be transferred so I did.

## 2020-02-14 ENCOUNTER — Other Ambulatory Visit: Payer: Self-pay | Admitting: Cardiology

## 2022-07-17 ENCOUNTER — Encounter: Payer: Self-pay | Admitting: Radiology

## 2022-10-29 ENCOUNTER — Telehealth: Payer: Self-pay | Admitting: Orthopaedic Surgery

## 2022-10-29 NOTE — Telephone Encounter (Signed)
Patient has finger injury, told him the diagnosis he was telling me he had that he needed to see a hand specialist, he said he will not be home until November, he will call back closer to that time if he does not correct it before

## 2022-10-29 NOTE — Telephone Encounter (Signed)
Pt called requesting a virtual appt with Dr Magnus Ivan. Explained to pt we dont have virtual appt. His last appt was 2021. Pt is asking for a call from Dr Magnus Ivan at [phone].
